# Patient Record
Sex: Male | Born: 1960 | Race: White | Hispanic: No | Marital: Married | Smoking: Never smoker
Health system: Southern US, Community
[De-identification: ages and names within clinical notes are randomized; demographics above are authoritative.]

## PROBLEM LIST (undated history)

## (undated) DIAGNOSIS — M1990 Unspecified osteoarthritis, unspecified site: Secondary | ICD-10-CM

## (undated) DIAGNOSIS — E890 Postprocedural hypothyroidism: Secondary | ICD-10-CM

## (undated) DIAGNOSIS — M545 Low back pain, unspecified: Secondary | ICD-10-CM

## (undated) DIAGNOSIS — I10 Essential (primary) hypertension: Secondary | ICD-10-CM

## (undated) DIAGNOSIS — E785 Hyperlipidemia, unspecified: Secondary | ICD-10-CM

## (undated) DIAGNOSIS — M259 Joint disorder, unspecified: Secondary | ICD-10-CM

## (undated) HISTORY — DX: Postprocedural hypothyroidism: E89.0

## (undated) HISTORY — DX: Unspecified osteoarthritis, unspecified site: M19.90

## (undated) HISTORY — DX: Low back pain, unspecified: M54.50

## (undated) HISTORY — DX: Joint disorder, unspecified: M25.9

## (undated) HISTORY — PX: OTHER SURGICAL HISTORY: SHX169

## (undated) HISTORY — PX: COLONOSCOPY: SHX174

## (undated) HISTORY — DX: Hyperlipidemia, unspecified: E78.5

## (undated) HISTORY — DX: Essential (primary) hypertension: I10

---

## 1997-09-27 HISTORY — PX: OTHER SURGICAL HISTORY: SHX169

## 1997-10-24 ENCOUNTER — Ambulatory Visit (HOSPITAL_COMMUNITY): Admission: RE | Admit: 1997-10-24 | Discharge: 1997-10-24 | Payer: Self-pay | Admitting: Internal Medicine

## 1997-10-27 ENCOUNTER — Ambulatory Visit (HOSPITAL_COMMUNITY): Admission: RE | Admit: 1997-10-27 | Discharge: 1997-10-27 | Payer: Self-pay | Admitting: Endocrinology

## 2001-07-23 ENCOUNTER — Encounter: Payer: Self-pay | Admitting: *Deleted

## 2001-07-23 ENCOUNTER — Ambulatory Visit (HOSPITAL_COMMUNITY): Admission: RE | Admit: 2001-07-23 | Discharge: 2001-07-23 | Payer: Self-pay | Admitting: *Deleted

## 2004-11-07 ENCOUNTER — Ambulatory Visit: Payer: Self-pay | Admitting: Endocrinology

## 2004-11-14 ENCOUNTER — Ambulatory Visit: Payer: Self-pay | Admitting: Endocrinology

## 2005-12-20 ENCOUNTER — Ambulatory Visit: Payer: Self-pay | Admitting: Endocrinology

## 2007-01-13 ENCOUNTER — Encounter: Payer: Self-pay | Admitting: Endocrinology

## 2007-01-13 DIAGNOSIS — E785 Hyperlipidemia, unspecified: Secondary | ICD-10-CM

## 2007-01-13 DIAGNOSIS — I10 Essential (primary) hypertension: Secondary | ICD-10-CM

## 2007-01-13 HISTORY — DX: Essential (primary) hypertension: I10

## 2007-01-13 HISTORY — DX: Hyperlipidemia, unspecified: E78.5

## 2007-02-25 ENCOUNTER — Ambulatory Visit: Payer: Self-pay | Admitting: Endocrinology

## 2007-02-25 LAB — CONVERTED CEMR LAB
ALT: 26 units/L (ref 0–53)
AST: 22 units/L (ref 0–37)
Albumin: 4.2 g/dL (ref 3.5–5.2)
Alkaline Phosphatase: 63 units/L (ref 39–117)
BUN: 12 mg/dL (ref 6–23)
Basophils Absolute: 0 10*3/uL (ref 0.0–0.1)
Basophils Relative: 0.6 % (ref 0.0–1.0)
Bilirubin Urine: NEGATIVE
Bilirubin, Direct: 0.2 mg/dL (ref 0.0–0.3)
CO2: 30 meq/L (ref 19–32)
Calcium: 9.4 mg/dL (ref 8.4–10.5)
Chloride: 102 meq/L (ref 96–112)
Cholesterol: 196 mg/dL (ref 0–200)
Creatinine, Ser: 1.2 mg/dL (ref 0.4–1.5)
Direct LDL: 135.5 mg/dL
Eosinophils Absolute: 0.1 10*3/uL (ref 0.0–0.6)
Eosinophils Relative: 2 % (ref 0.0–5.0)
GFR calc Af Amer: 85 mL/min
GFR calc non Af Amer: 70 mL/min
Glucose, Bld: 95 mg/dL (ref 70–99)
HCT: 43 % (ref 39.0–52.0)
HDL: 37.4 mg/dL — ABNORMAL LOW (ref >39.0)
Hemoglobin, Urine: NEGATIVE
Hemoglobin: 15.1 g/dL (ref 13.0–17.0)
Ketones, ur: NEGATIVE mg/dL
Leukocytes, UA: NEGATIVE
Lymphocytes Relative: 24.1 % (ref 12.0–46.0)
MCHC: 35.1 g/dL (ref 30.0–36.0)
MCV: 87 fL (ref 78.0–100.0)
Monocytes Absolute: 0.5 10*3/uL (ref 0.2–0.7)
Monocytes Relative: 7.4 % (ref 3.0–11.0)
Neutro Abs: 4.9 10*3/uL (ref 1.4–7.7)
Neutrophils Relative %: 65.9 % (ref 43.0–77.0)
Nitrite: NEGATIVE
PSA: 0.58 ng/mL (ref 0.10–4.00)
Platelets: 242 10*3/uL (ref 150–400)
Potassium: 4 meq/L (ref 3.5–5.1)
RBC: 4.94 M/uL (ref 4.22–5.81)
RDW: 12.8 % (ref 11.5–14.6)
Sodium: 139 meq/L (ref 135–145)
Specific Gravity, Urine: 1.025 (ref 1.000–1.03)
TSH: 2.23 microintl units/mL (ref 0.35–5.50)
Total Bilirubin: 0.8 mg/dL (ref 0.3–1.2)
Total CHOL/HDL Ratio: 5.2
Total Protein, Urine: NEGATIVE mg/dL
Total Protein: 7.2 g/dL (ref 6.0–8.3)
Triglycerides: 210 mg/dL (ref 0–149)
Urine Glucose: NEGATIVE mg/dL
Urobilinogen, UA: 0.2 (ref 0.0–1.0)
VLDL: 42 mg/dL — ABNORMAL HIGH (ref 0–40)
WBC: 7.2 10*3/uL (ref 4.5–10.5)
pH: 6 (ref 5.0–8.0)

## 2008-03-14 ENCOUNTER — Telehealth (INDEPENDENT_AMBULATORY_CARE_PROVIDER_SITE_OTHER): Payer: Self-pay | Admitting: *Deleted

## 2008-03-30 ENCOUNTER — Ambulatory Visit: Payer: Self-pay | Admitting: Endocrinology

## 2008-03-31 LAB — CONVERTED CEMR LAB
ALT: 23 units/L (ref 0–53)
AST: 20 units/L (ref 0–37)
Albumin: 4.2 g/dL (ref 3.5–5.2)
Alkaline Phosphatase: 46 units/L (ref 39–117)
BUN: 14 mg/dL (ref 6–23)
Basophils Absolute: 0 10*3/uL (ref 0.0–0.1)
Basophils Relative: 0.6 % (ref 0.0–3.0)
Bilirubin Urine: NEGATIVE
Bilirubin, Direct: 0.1 mg/dL (ref 0.0–0.3)
CO2: 30 meq/L (ref 19–32)
Calcium: 9.1 mg/dL (ref 8.4–10.5)
Chloride: 108 meq/L (ref 96–112)
Cholesterol: 184 mg/dL (ref 0–200)
Creatinine, Ser: 1.1 mg/dL (ref 0.4–1.5)
Eosinophils Absolute: 0.2 10*3/uL (ref 0.0–0.7)
Eosinophils Relative: 3.1 % (ref 0.0–5.0)
GFR calc Af Amer: 93 mL/min
GFR calc non Af Amer: 77 mL/min
Glucose, Bld: 92 mg/dL (ref 70–99)
HCT: 42.8 % (ref 39.0–52.0)
HDL: 39.7 mg/dL (ref >39.0)
Hemoglobin, Urine: NEGATIVE
Hemoglobin: 14.6 g/dL (ref 13.0–17.0)
Ketones, ur: NEGATIVE mg/dL
LDL Cholesterol: 130 mg/dL — ABNORMAL HIGH (ref 0–99)
Leukocytes, UA: NEGATIVE
Lymphocytes Relative: 35.2 % (ref 12.0–46.0)
MCHC: 34.2 g/dL (ref 30.0–36.0)
MCV: 89 fL (ref 78.0–100.0)
Monocytes Absolute: 0.4 10*3/uL (ref 0.1–1.0)
Monocytes Relative: 8.5 % (ref 3.0–12.0)
Neutro Abs: 2.8 10*3/uL (ref 1.4–7.7)
Neutrophils Relative %: 52.6 % (ref 43.0–77.0)
Nitrite: NEGATIVE
Platelets: 217 10*3/uL (ref 150–400)
Potassium: 3.6 meq/L (ref 3.5–5.1)
RBC: 4.81 M/uL (ref 4.22–5.81)
RDW: 12.9 % (ref 11.5–14.6)
Sodium: 143 meq/L (ref 135–145)
Specific Gravity, Urine: >=1.03 (ref 1.000–1.03)
TSH: 1.98 microintl units/mL (ref 0.35–5.50)
Total Bilirubin: 1.1 mg/dL (ref 0.3–1.2)
Total CHOL/HDL Ratio: 4.6
Total Protein, Urine: NEGATIVE mg/dL
Total Protein: 7 g/dL (ref 6.0–8.3)
Triglycerides: 73 mg/dL (ref 0–149)
Urine Glucose: NEGATIVE mg/dL
Urobilinogen, UA: 0.2 (ref 0.0–1.0)
VLDL: 15 mg/dL (ref 0–40)
WBC: 5.2 10*3/uL (ref 4.5–10.5)
pH: 6 (ref 5.0–8.0)

## 2008-04-05 ENCOUNTER — Ambulatory Visit: Payer: Self-pay | Admitting: Endocrinology

## 2008-04-05 DIAGNOSIS — E890 Postprocedural hypothyroidism: Secondary | ICD-10-CM

## 2008-04-05 HISTORY — DX: Postprocedural hypothyroidism: E89.0

## 2008-07-07 ENCOUNTER — Ambulatory Visit: Payer: Self-pay | Admitting: Endocrinology

## 2009-03-08 ENCOUNTER — Ambulatory Visit: Payer: Self-pay | Admitting: Internal Medicine

## 2009-04-17 ENCOUNTER — Telehealth (INDEPENDENT_AMBULATORY_CARE_PROVIDER_SITE_OTHER): Payer: Self-pay | Admitting: *Deleted

## 2009-07-26 ENCOUNTER — Ambulatory Visit: Payer: Self-pay | Admitting: Endocrinology

## 2009-07-26 LAB — CONVERTED CEMR LAB
ALT: 20 units/L (ref 0–53)
AST: 19 units/L (ref 0–37)
Albumin: 4.2 g/dL (ref 3.5–5.2)
Alkaline Phosphatase: 52 units/L (ref 39–117)
BUN: 12 mg/dL (ref 6–23)
Basophils Absolute: 0.1 10*3/uL (ref 0.0–0.1)
Basophils Relative: 0.9 % (ref 0.0–3.0)
Bilirubin Urine: NEGATIVE
Bilirubin, Direct: 0.1 mg/dL (ref 0.0–0.3)
CO2: 29 meq/L (ref 19–32)
Calcium: 9.3 mg/dL (ref 8.4–10.5)
Chloride: 106 meq/L (ref 96–112)
Cholesterol: 165 mg/dL (ref 0–200)
Creatinine, Ser: 1 mg/dL (ref 0.4–1.5)
Eosinophils Absolute: 0.2 10*3/uL (ref 0.0–0.7)
Eosinophils Relative: 2.5 % (ref 0.0–5.0)
GFR calc non Af Amer: 85.34 mL/min (ref >60)
Glucose, Bld: 91 mg/dL (ref 70–99)
HCT: 44.8 % (ref 39.0–52.0)
HDL: 42.4 mg/dL (ref >39.00)
Hemoglobin, Urine: NEGATIVE
Hemoglobin: 14.8 g/dL (ref 13.0–17.0)
Ketones, ur: NEGATIVE mg/dL
LDL Cholesterol: 105 mg/dL — ABNORMAL HIGH (ref 0–99)
Leukocytes, UA: NEGATIVE
Lymphocytes Relative: 27.1 % (ref 12.0–46.0)
Lymphs Abs: 1.8 10*3/uL (ref 0.7–4.0)
MCHC: 33 g/dL (ref 30.0–36.0)
MCV: 88.9 fL (ref 78.0–100.0)
Monocytes Absolute: 0.4 10*3/uL (ref 0.1–1.0)
Monocytes Relative: 6.4 % (ref 3.0–12.0)
Neutro Abs: 4.2 10*3/uL (ref 1.4–7.7)
Neutrophils Relative %: 63.1 % (ref 43.0–77.0)
Nitrite: NEGATIVE
PSA: 0.71 ng/mL (ref 0.10–4.00)
Platelets: 222 10*3/uL (ref 150.0–400.0)
Potassium: 3.5 meq/L (ref 3.5–5.1)
RBC: 5.04 M/uL (ref 4.22–5.81)
RDW: 12.6 % (ref 11.5–14.6)
Sodium: 143 meq/L (ref 135–145)
Specific Gravity, Urine: >=1.03 (ref 1.000–1.030)
TSH: 3.46 microintl units/mL (ref 0.35–5.50)
Total Bilirubin: 0.6 mg/dL (ref 0.3–1.2)
Total CHOL/HDL Ratio: 4
Total Protein, Urine: NEGATIVE mg/dL
Total Protein: 7.3 g/dL (ref 6.0–8.3)
Triglycerides: 89 mg/dL (ref 0.0–149.0)
Urine Glucose: NEGATIVE mg/dL
Urobilinogen, UA: 0.2 (ref 0.0–1.0)
VLDL: 17.8 mg/dL (ref 0.0–40.0)
WBC: 6.7 10*3/uL (ref 4.5–10.5)
pH: 5.5 (ref 5.0–8.0)
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
## 2010-08-27 ENCOUNTER — Other Ambulatory Visit (INDEPENDENT_AMBULATORY_CARE_PROVIDER_SITE_OTHER): Payer: BC Managed Care – PPO

## 2010-08-27 DIAGNOSIS — Z0389 Encounter for observation for other suspected diseases and conditions ruled out: Secondary | ICD-10-CM

## 2010-08-27 DIAGNOSIS — Z0000 Encounter for general adult medical examination without abnormal findings: Secondary | ICD-10-CM

## 2010-08-27 LAB — BASIC METABOLIC PANEL
Calcium: 8.6 mg/dL (ref 8.4–10.5)
Creatinine, Ser: 1.1 mg/dL (ref 0.4–1.5)
GFR: 77.72 mL/min (ref >60.00)
Sodium: 138 mEq/L (ref 135–145)

## 2010-08-27 LAB — LIPID PANEL
HDL: 42.6 mg/dL (ref >39.00)
LDL Cholesterol: 104 mg/dL — ABNORMAL HIGH (ref 0–99)
Total CHOL/HDL Ratio: 4
Triglycerides: 82 mg/dL (ref 0.0–149.0)

## 2010-08-27 LAB — CBC WITH DIFFERENTIAL/PLATELET
Basophils Relative: 0.5 % (ref 0.0–3.0)
Eosinophils Relative: 3.1 % (ref 0.0–5.0)
HCT: 42.6 % (ref 39.0–52.0)
Lymphs Abs: 1.9 10*3/uL (ref 0.7–4.0)
MCV: 87.5 fl (ref 78.0–100.0)
Monocytes Absolute: 0.4 10*3/uL (ref 0.1–1.0)
Monocytes Relative: 7.3 % (ref 3.0–12.0)
Platelets: 215 10*3/uL (ref 150.0–400.0)
RBC: 4.87 Mil/uL (ref 4.22–5.81)
WBC: 5.5 10*3/uL (ref 4.5–10.5)

## 2010-08-27 LAB — URINALYSIS
Leukocytes, UA: NEGATIVE
Specific Gravity, Urine: 1.025 (ref 1.000–1.030)
Urine Glucose: NEGATIVE
Urobilinogen, UA: 0.2 (ref 0.0–1.0)
pH: 5.5 (ref 5.0–8.0)

## 2010-08-27 LAB — HEPATIC FUNCTION PANEL
Alkaline Phosphatase: 53 U/L (ref 39–117)
Bilirubin, Direct: 0.1 mg/dL (ref 0.0–0.3)
Total Bilirubin: 0.8 mg/dL (ref 0.3–1.2)

## 2010-08-27 LAB — TSH: TSH: 5.97 u[IU]/mL — ABNORMAL HIGH (ref 0.35–5.50)

## 2010-08-27 LAB — PSA: PSA: 0.64 ng/mL (ref 0.10–4.00)

## 2010-09-03 ENCOUNTER — Ambulatory Visit: Payer: Self-pay | Admitting: Endocrinology

## 2010-09-11 ENCOUNTER — Encounter: Payer: Self-pay | Admitting: Endocrinology

## 2010-09-11 ENCOUNTER — Ambulatory Visit (INDEPENDENT_AMBULATORY_CARE_PROVIDER_SITE_OTHER): Payer: BC Managed Care – PPO | Admitting: Endocrinology

## 2010-09-11 VITALS — BP 128/86 | HR 79 | Temp 98.8°F | Ht 74.5 in | Wt 278.4 lb

## 2010-09-11 DIAGNOSIS — I10 Essential (primary) hypertension: Secondary | ICD-10-CM

## 2010-09-11 MED ORDER — LEVOTHYROXINE SODIUM 112 MCG PO CAPS: 2.0000 | ORAL_CAPSULE | Freq: Every day | ORAL | Status: DC

## 2010-09-11 MED ORDER — INDAPAMIDE 1.25 MG PO TABS: 1.2500 mg | ORAL_TABLET | Freq: Every day | ORAL | Status: DC

## 2010-09-11 NOTE — Progress Notes (Signed)
  Subjective:    Patient ID: Alexis Norris, male    DOB: 31-Aug-1960, 50 y.o.   MRN: [id number]  HPI here for regular wellness examination.  He's feeling pretty well in general, and says chronic med probs are stable, except as noted below Past Medical History  Diagnosis Date  . HYPERTENSION 01/13/2007  . HYPOTHYROIDISM, 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  . DYSLIPIDEMIA 01/13/2007    Past Surgical History  Procedure Date  . I-131 therapy 09/1997    History   Social History  . Marital Status: Married    Spouse Name: N/A    Number of Children: N/A  . Years of Education: N/A   Occupational History  . Paramedic    Social History Main Topics  . Smoking status: Never Smoker   . Smokeless tobacco: Not on file  . Alcohol Use: Not on file  . Drug Use: Not on file  . Sexually Active: Not on file   Other Topics Concern  . Not on file   Social History Narrative   Works Forensic scientist    No current outpatient prescriptions on file prior to visit.    Allergies  Allergen Reactions  . Simvastatin     REACTION: didn't feel well    Family History  Problem Relation Age of Onset  . Thyroid cancer Sister     BP 128/86  Pulse 79  Temp(Src) 98.8 F (37.1 C) (Oral)  Ht 6' 2.5" (1.892 m)  Wt 278 lb 6.4 oz (126.281 kg)  BMI 35.27 kg/m2  SpO2 98%    Review of Systems  Constitutional: Negative for fever.       Minimal weight gain  HENT: Negative for hearing loss.   Eyes: Negative for visual disturbance.  Respiratory: Negative for shortness of breath.   Cardiovascular: Negative for chest pain.  Gastrointestinal: Negative for blood in stool.  Genitourinary: Negative for hematuria.  Musculoskeletal: Negative for arthralgias.  Skin: Negative for rash.  Neurological: Negative for syncope.  Hematological: Does not bruise/bleed easily.  Psychiatric/Behavioral: Negative for dysphoric mood. The patient is not nervous/anxious.        Objective:   Physical Exam VS: see vs  page GEN: no distress HEAD: head: no deformity eyes: no periorbital swelling, no proptosis external nose and ears are normal mouth: no lesion seen. NECK: supple, thyroid is not enlarged. CHEST WALL: no deformity. BREASTS:  No gynecomastia. CV: reg rate and rhythm, no murmur. ABD: abdomen is soft, nontender.  no hepatosplenomegaly.  not distended.  no hernia MUSCULOSKELETAL: muscle bulk and strength are grossly normal.  no obvious joint swelling.  gait is normal and steady EXTEMITIES: no deformity.  no ulcer on the feet.  feet are of normal color and temp.  no edema PULSES: dorsalis pedis intact bilat.  no carotid bruit NEURO:  cn 2-12 grossly intact.   readily moves all 4's.  sensation is intact to touch on the feet SKIN:  Normal texture and temperature.  No rash or suspicious lesion is visible.  There are several sq nodules on the legs. NODES:  None palpable at the neck.  PSYCH: alert, oriented x3.  Does not appear anxious nor depressed.    Lab Results  Component Value Date   TSH 5.97* 08/27/2010     Assessment & Plan:  Wellness visit today, with problems stable, except as noted. Hypothyroidism, needs increased rx

## 2010-09-11 NOTE — Patient Instructions (Addendum)
Increase levothyroxine to 2x112 mcg/day.   please consider these measures for your health:  minimize alcohol.  do not use tobacco products.  have a colonoscopy at least every 10 years from age 48.  keep firearms safely stored.  always use seat belts.  have working smoke alarms in your home.  see an eye doctor and dentist regularly.  never drive under the influence of alcohol or drugs (including prescription drugs).  those with fair skin should take precautions against the sun. Please return in 1 year.

## 2010-09-14 NOTE — Cardiovascular Report (Signed)
[place]. [facility]  Patient:    Alexis Norris, Alexis Norris Visit Number: [id number] MRN: [id number]          Service Type: CAT Location: [facility] 2857 [id number] Attending Physician:  Daisey Must Dictated by:   Daisey Must, M.D. [facility] Proc. Date: 07/23/01 Admit Date:  07/23/2001 Discharge Date: 07/23/2001   CC:         Justine Null, M.D. [facility]  Nathen May, M.D. [facility]  Cardiac Catheterization Laboratory   Cardiac Catheterization  PROCEDURES PERFORMED: Left heart catheterization with coronary angiography, left ventriculography, and aortic root angiography.  INDICATIONS: The patient is a 50 year old male with significant cardiac risk factors. He has been having chest pain. A recent stress Cardiolite showed possible ischemia in the anterior wall. He was then referred for cardiac catheterization.  DESCRIPTION OF PROCEDURE: A 6 French sheath was placed in the right femoral artery.  Standard Judkins 6 French catheters were utilized.  Contrast was Omnipaque.  There were no complications.  RESULTS:  HEMODYNAMICS: Left ventricular pressure 132/17, aortic pressure 132/86.  There was no aortic valve gradient.  LEFT VENTRICULOGRAM: Wall motion is normal. Ejection fraction calculated at 66%. There is no mitral regurgitation.  Aortic root angiography reveals a normal tricuspid aortic valve with no aortic insufficiency. Aortic root itself is also normal.  CORONARY ARTERIOGRAPHY: (Codominant).  Left main is short but normal.  Left anterior descending artery gives rise to a small diagonal branch. The LAD is normal.  Left circumflex is a large codominant vessel. It gives rise to a small ramus intermediate, large OM-1, small OM-2 and a normal sized posterolateral branch. The left circumflex is normal by angiography.  The right coronary artery is a relatively small codominant vessel. It gives rise to a small to normal sized posterior descending  artery and a small posterolateral branch. The right coronary artery is normal.  IMPRESSIONS: 1. Normal left ventricular systolic function. 2. Angiographically normal coronary arteries. Dictated by:   Daisey Must, M.D. [facility] Attending Physician:  Daisey Must DD:  07/23/01 TD:  07/24/01 Job: [id number]

## 2011-08-22 ENCOUNTER — Emergency Department (HOSPITAL_COMMUNITY): Payer: No Typology Code available for payment source

## 2011-08-22 ENCOUNTER — Encounter (HOSPITAL_COMMUNITY): Payer: Self-pay | Admitting: Emergency Medicine

## 2011-08-22 ENCOUNTER — Emergency Department (HOSPITAL_COMMUNITY)
Admission: EM | Admit: 2011-08-22 | Discharge: 2011-08-23 | Disposition: A | Discharge status: Home / Self Care | Payer: No Typology Code available for payment source | Attending: Emergency Medicine | Admitting: Emergency Medicine

## 2011-08-22 DIAGNOSIS — S5010XA Contusion of unspecified forearm, initial encounter: Secondary | ICD-10-CM | POA: Insufficient documentation

## 2011-08-22 DIAGNOSIS — Z79899 Other long term (current) drug therapy: Secondary | ICD-10-CM | POA: Insufficient documentation

## 2011-08-22 DIAGNOSIS — S6390XA Sprain of unspecified part of unspecified wrist and hand, initial encounter: Secondary | ICD-10-CM | POA: Insufficient documentation

## 2011-08-22 DIAGNOSIS — M7989 Other specified soft tissue disorders: Secondary | ICD-10-CM | POA: Insufficient documentation

## 2011-08-22 DIAGNOSIS — S20219A Contusion of unspecified front wall of thorax, initial encounter: Secondary | ICD-10-CM | POA: Insufficient documentation

## 2011-08-22 DIAGNOSIS — E876 Hypokalemia: Secondary | ICD-10-CM

## 2011-08-22 DIAGNOSIS — R079 Chest pain, unspecified: Secondary | ICD-10-CM | POA: Insufficient documentation

## 2011-08-22 DIAGNOSIS — I10 Essential (primary) hypertension: Secondary | ICD-10-CM | POA: Insufficient documentation

## 2011-08-22 DIAGNOSIS — E039 Hypothyroidism, unspecified: Secondary | ICD-10-CM | POA: Insufficient documentation

## 2011-08-22 DIAGNOSIS — S63609A Unspecified sprain of unspecified thumb, initial encounter: Secondary | ICD-10-CM

## 2011-08-22 DIAGNOSIS — S80219A Abrasion, unspecified knee, initial encounter: Secondary | ICD-10-CM

## 2011-08-22 DIAGNOSIS — S6000XA Contusion of unspecified finger without damage to nail, initial encounter: Secondary | ICD-10-CM | POA: Insufficient documentation

## 2011-08-22 DIAGNOSIS — E785 Hyperlipidemia, unspecified: Secondary | ICD-10-CM | POA: Insufficient documentation

## 2011-08-22 DIAGNOSIS — IMO0002 Reserved for concepts with insufficient information to code with codable children: Secondary | ICD-10-CM | POA: Insufficient documentation

## 2011-08-22 DIAGNOSIS — IMO0001 Reserved for inherently not codable concepts without codable children: Secondary | ICD-10-CM | POA: Insufficient documentation

## 2011-08-22 LAB — COMPREHENSIVE METABOLIC PANEL
Albumin: 4.2 g/dL (ref 3.5–5.2)
BUN: 13 mg/dL (ref 6–23)
Chloride: 103 mEq/L (ref 96–112)
Creatinine, Ser: 1.24 mg/dL (ref 0.50–1.35)
GFR calc Af Amer: 78 mL/min — ABNORMAL LOW (ref >90)
Total Bilirubin: 0.4 mg/dL (ref 0.3–1.2)

## 2011-08-22 LAB — POCT I-STAT, CHEM 8
Creatinine, Ser: 1.2 mg/dL (ref 0.50–1.35)
HCT: 44 % (ref 39.0–52.0)
Hemoglobin: 15 g/dL (ref 13.0–17.0)
Potassium: 2.8 mEq/L — ABNORMAL LOW (ref 3.5–5.1)
Sodium: 144 mEq/L (ref 135–145)

## 2011-08-22 LAB — CBC
HCT: 44.4 % (ref 39.0–52.0)
Hemoglobin: 15.4 g/dL (ref 13.0–17.0)
MCH: 29.2 pg (ref 26.0–34.0)
MCHC: 34.7 g/dL (ref 30.0–36.0)

## 2011-08-22 LAB — DIFFERENTIAL
Basophils Relative: 0 % (ref 0–1)
Eosinophils Absolute: 0.2 10*3/uL (ref 0.0–0.7)
Monocytes Absolute: 0.5 10*3/uL (ref 0.1–1.0)
Monocytes Relative: 7 % (ref 3–12)
Neutro Abs: 4 10*3/uL (ref 1.7–7.7)

## 2011-08-22 MED ORDER — IOHEXOL 300 MG/ML  SOLN: 100.0000 mL | Freq: Once | INTRAMUSCULAR | Status: AC | PRN

## 2011-08-22 MED ORDER — POTASSIUM CHLORIDE CRYS ER 20 MEQ PO TBCR
40.0000 meq | EXTENDED_RELEASE_TABLET | Freq: Once | ORAL | Status: AC
  Administered 2011-08-22: 40 meq via ORAL
  Filled 2011-08-22: qty 2

## 2011-08-22 MED ORDER — HYDROMORPHONE HCL PF 1 MG/ML IJ SOLN
1.0000 mg | Freq: Once | INTRAMUSCULAR | Status: AC
  Administered 2011-08-22: 1 mg via INTRAVENOUS
  Filled 2011-08-22: qty 1

## 2011-08-22 NOTE — ED Notes (Signed)
The pt is alert and oriented skin warm and dry.  C/o being hot
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
## 2011-08-22 NOTE — ED Notes (Signed)
The pt says he is a little more comfortable.  He still has pressure on his chest
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
## 2011-08-23 MED ORDER — IOHEXOL 300 MG/ML  SOLN
100.0000 mL | Freq: Once | INTRAMUSCULAR | Status: AC | PRN
  Administered 2011-08-23: 100 mL via INTRAVENOUS

## 2011-08-23 MED ORDER — POTASSIUM CHLORIDE ER 10 MEQ PO TBCR: 20.0000 meq | EXTENDED_RELEASE_TABLET | Freq: Two times a day (BID) | ORAL | Status: DC

## 2011-08-23 MED ORDER — IOHEXOL 300 MG/ML  SOLN: 100.0000 mL | Freq: Once | INTRAMUSCULAR | Status: AC | PRN

## 2011-08-23 MED ORDER — HYDROCODONE-ACETAMINOPHEN 5-500 MG PO TABS: 1.0000 | ORAL_TABLET | Freq: Four times a day (QID) | ORAL | Status: DC | PRN

## 2011-08-23 NOTE — ED Notes (Signed)
The pt returned from xray his wife is at the bedside

## 2011-08-24 NOTE — ED Provider Notes (Signed)
I saw and evaluated the patient, reviewed the resident's note and I agree with the findings and plan.   .Face to face Exam:  General:  Awake HEENT:  Atraumatic Resp:  Normal effort Abd:  Nondistended Neuro:No focal weakness Lymph: No adenopathy   Nelia Shi, MD 08/24/11 [phone]

## 2011-08-26 ENCOUNTER — Ambulatory Visit (INDEPENDENT_AMBULATORY_CARE_PROVIDER_SITE_OTHER): Payer: No Typology Code available for payment source | Admitting: Endocrinology

## 2011-08-26 ENCOUNTER — Encounter: Payer: Self-pay | Admitting: Endocrinology

## 2011-08-26 VITALS — BP 146/84 | HR 103 | Temp 98.7°F | Ht 74.5 in | Wt 214.0 lb

## 2011-08-26 DIAGNOSIS — E785 Hyperlipidemia, unspecified: Secondary | ICD-10-CM

## 2011-08-26 DIAGNOSIS — J309 Allergic rhinitis, unspecified: Secondary | ICD-10-CM | POA: Insufficient documentation

## 2011-08-26 DIAGNOSIS — E890 Postprocedural hypothyroidism: Secondary | ICD-10-CM

## 2011-08-26 DIAGNOSIS — I10 Essential (primary) hypertension: Secondary | ICD-10-CM

## 2011-08-26 DIAGNOSIS — Z79899 Other long term (current) drug therapy: Secondary | ICD-10-CM | POA: Insufficient documentation

## 2011-08-26 MED ORDER — HYDROCODONE-ACETAMINOPHEN 5-500 MG PO TABS: 1.0000 | ORAL_TABLET | Freq: Four times a day (QID) | ORAL | Status: AC | PRN

## 2011-08-26 NOTE — Patient Instructions (Addendum)
Here is a refill of the pain medication. blood tests are being requested for you today.  You will receive a letter with results. Please come in for your regular physical after 09/11/11.   Here is a sample of "nasonex," to take 1 spray each nostril daily.
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
## 2011-08-27 ENCOUNTER — Other Ambulatory Visit (INDEPENDENT_AMBULATORY_CARE_PROVIDER_SITE_OTHER): Payer: No Typology Code available for payment source

## 2011-08-27 ENCOUNTER — Encounter: Payer: Self-pay | Admitting: Endocrinology

## 2011-08-27 DIAGNOSIS — Z79899 Other long term (current) drug therapy: Secondary | ICD-10-CM

## 2011-08-27 DIAGNOSIS — E890 Postprocedural hypothyroidism: Secondary | ICD-10-CM

## 2011-08-27 DIAGNOSIS — I10 Essential (primary) hypertension: Secondary | ICD-10-CM

## 2011-08-27 DIAGNOSIS — E785 Hyperlipidemia, unspecified: Secondary | ICD-10-CM

## 2011-08-27 LAB — BASIC METABOLIC PANEL
GFR: 88.66 mL/min (ref >60.00)
Glucose, Bld: 90 mg/dL (ref 70–99)
Potassium: 3.8 mEq/L (ref 3.5–5.1)
Sodium: 141 mEq/L (ref 135–145)

## 2011-08-27 LAB — URINALYSIS, ROUTINE W REFLEX MICROSCOPIC
Bilirubin Urine: NEGATIVE
Hgb urine dipstick: NEGATIVE
Ketones, ur: NEGATIVE
Total Protein, Urine: NEGATIVE
Urine Glucose: NEGATIVE
pH: 6 (ref 5.0–8.0)

## 2011-08-27 LAB — CBC WITH DIFFERENTIAL/PLATELET
Basophils Absolute: 0 10*3/uL (ref 0.0–0.1)
Basophils Relative: 0.6 % (ref 0.0–3.0)
Eosinophils Absolute: 0.2 10*3/uL (ref 0.0–0.7)
HCT: 44.5 % (ref 39.0–52.0)
Hemoglobin: 15.2 g/dL (ref 13.0–17.0)
Lymphs Abs: 1.8 10*3/uL (ref 0.7–4.0)
MCHC: 34.1 g/dL (ref 30.0–36.0)
Monocytes Relative: 5.8 % (ref 3.0–12.0)
Neutro Abs: 4 10*3/uL (ref 1.4–7.7)
RBC: 5.23 Mil/uL (ref 4.22–5.81)
RDW: 14.3 % (ref 11.5–14.6)

## 2011-08-27 LAB — LIPID PANEL
Cholesterol: 169 mg/dL (ref 0–200)
HDL: 45.4 mg/dL (ref >39.00)
Triglycerides: 113 mg/dL (ref 0.0–149.0)
VLDL: 22.6 mg/dL (ref 0.0–40.0)

## 2011-08-27 LAB — HEPATIC FUNCTION PANEL
AST: 20 U/L (ref 0–37)
Albumin: 4 g/dL (ref 3.5–5.2)
Alkaline Phosphatase: 64 U/L (ref 39–117)
Total Protein: 7.5 g/dL (ref 6.0–8.3)

## 2011-08-27 LAB — TSH: TSH: 5.85 u[IU]/mL — ABNORMAL HIGH (ref 0.35–5.50)

## 2011-08-27 MED ORDER — LEVOTHYROXINE SODIUM 125 MCG PO TABS: ORAL_TABLET | ORAL | Status: DC

## 2011-08-28 ENCOUNTER — Telehealth: Payer: Self-pay | Admitting: *Deleted

## 2011-08-28 MED ORDER — INDAPAMIDE 1.25 MG PO TABS: 1.2500 mg | ORAL_TABLET | Freq: Every day | ORAL | Status: DC

## 2011-08-28 NOTE — Telephone Encounter (Signed)
Called pt to inform of lab results, pt informed of results (letter also mailed to pt). 

## 2011-09-17 ENCOUNTER — Ambulatory Visit (INDEPENDENT_AMBULATORY_CARE_PROVIDER_SITE_OTHER): Payer: No Typology Code available for payment source | Admitting: Endocrinology

## 2011-09-17 DIAGNOSIS — Z0000 Encounter for general adult medical examination without abnormal findings: Secondary | ICD-10-CM

## 2011-09-17 NOTE — Patient Instructions (Signed)
please consider these measures for your health:  minimize alcohol.  do not use tobacco products.  have a colonoscopy at least every 10 years from age 52.  keep firearms safely stored.  always use seat belts.  have working smoke alarms in your home.  see an eye doctor and dentist regularly.  never drive under the influence of alcohol or drugs (including prescription drugs).  those with fair skin should take precautions against the sun. Please return in 1 year.   
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
## 2011-09-21 DIAGNOSIS — Z0000 Encounter for general adult medical examination without abnormal findings: Secondary | ICD-10-CM | POA: Insufficient documentation
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
## 2012-09-16 ENCOUNTER — Other Ambulatory Visit: Payer: Self-pay | Admitting: Endocrinology

## 2012-09-22 ENCOUNTER — Other Ambulatory Visit: Payer: Self-pay | Admitting: Endocrinology

## 2012-10-16 ENCOUNTER — Telehealth: Payer: Self-pay

## 2012-10-16 ENCOUNTER — Other Ambulatory Visit: Payer: Self-pay | Admitting: *Deleted

## 2012-10-16 MED ORDER — LEVOTHYROXINE SODIUM 125 MCG PO TABS: ORAL_TABLET | ORAL | Status: DC

## 2012-10-16 NOTE — Telephone Encounter (Signed)
Pt called [facility] requesting a call back from Dr Everardo All office. Thanks

## 2012-10-16 NOTE — Telephone Encounter (Signed)
Pt was just being sure his rx was sent in and wanted to know if he needed to schedule an appt with Dr Everardo All. I advised him rx was sent in and he does need to schedule an appt.

## 2012-12-02 ENCOUNTER — Other Ambulatory Visit: Payer: Self-pay

## 2012-12-02 DIAGNOSIS — Z125 Encounter for screening for malignant neoplasm of prostate: Secondary | ICD-10-CM

## 2012-12-02 DIAGNOSIS — Z0000 Encounter for general adult medical examination without abnormal findings: Secondary | ICD-10-CM

## 2012-12-08 ENCOUNTER — Other Ambulatory Visit (INDEPENDENT_AMBULATORY_CARE_PROVIDER_SITE_OTHER): Payer: BC Managed Care – PPO

## 2012-12-08 DIAGNOSIS — Z125 Encounter for screening for malignant neoplasm of prostate: Secondary | ICD-10-CM

## 2012-12-08 DIAGNOSIS — Z0000 Encounter for general adult medical examination without abnormal findings: Secondary | ICD-10-CM

## 2012-12-08 DIAGNOSIS — E785 Hyperlipidemia, unspecified: Secondary | ICD-10-CM

## 2012-12-08 LAB — URINALYSIS, ROUTINE W REFLEX MICROSCOPIC
Leukocytes, UA: NEGATIVE
Nitrite: NEGATIVE
Specific Gravity, Urine: 1.025 (ref 1.000–1.030)
Total Protein, Urine: NEGATIVE
pH: 6 (ref 5.0–8.0)

## 2012-12-08 LAB — BASIC METABOLIC PANEL
BUN: 18 mg/dL (ref 6–23)
CO2: 26 mEq/L (ref 19–32)
Calcium: 8.8 mg/dL (ref 8.4–10.5)
Creatinine, Ser: 0.9 mg/dL (ref 0.4–1.5)
GFR: 92.63 mL/min (ref >60.00)
Glucose, Bld: 86 mg/dL (ref 70–99)

## 2012-12-08 LAB — CBC WITH DIFFERENTIAL/PLATELET
Basophils Absolute: 0.1 10*3/uL (ref 0.0–0.1)
Eosinophils Relative: 1.6 % (ref 0.0–5.0)
HCT: 43.3 % (ref 39.0–52.0)
Lymphocytes Relative: 14.4 % (ref 12.0–46.0)
Lymphs Abs: 1.9 10*3/uL (ref 0.7–4.0)
Monocytes Relative: 5.2 % (ref 3.0–12.0)
Platelets: 261 10*3/uL (ref 150.0–400.0)
RDW: 13.9 % (ref 11.5–14.6)
WBC: 13.5 10*3/uL — ABNORMAL HIGH (ref 4.5–10.5)

## 2012-12-08 LAB — PSA: PSA: 0.74 ng/mL (ref 0.10–4.00)

## 2012-12-08 LAB — HEPATIC FUNCTION PANEL
ALT: 16 U/L (ref 0–53)
AST: 16 U/L (ref 0–37)
Alkaline Phosphatase: 58 U/L (ref 39–117)
Bilirubin, Direct: 0 mg/dL (ref 0.0–0.3)
Total Bilirubin: 0.5 mg/dL (ref 0.3–1.2)

## 2012-12-08 LAB — LIPID PANEL
Cholesterol: 176 mg/dL (ref 0–200)
Total CHOL/HDL Ratio: 6
Triglycerides: 477 mg/dL — ABNORMAL HIGH (ref 0.0–149.0)

## 2012-12-11 ENCOUNTER — Ambulatory Visit: Payer: BC Managed Care – PPO | Admitting: Endocrinology

## 2012-12-15 ENCOUNTER — Ambulatory Visit (INDEPENDENT_AMBULATORY_CARE_PROVIDER_SITE_OTHER): Payer: BC Managed Care – PPO | Admitting: Endocrinology

## 2012-12-15 ENCOUNTER — Encounter: Payer: Self-pay | Admitting: Endocrinology

## 2012-12-15 VITALS — BP 134/80 | Ht 74.0 in | Wt 267.0 lb

## 2012-12-15 DIAGNOSIS — M25562 Pain in left knee: Secondary | ICD-10-CM

## 2012-12-15 DIAGNOSIS — Z0000 Encounter for general adult medical examination without abnormal findings: Secondary | ICD-10-CM

## 2012-12-15 DIAGNOSIS — M25569 Pain in unspecified knee: Secondary | ICD-10-CM | POA: Insufficient documentation

## 2012-12-15 MED ORDER — LEVOTHYROXINE SODIUM 112 MCG PO TABS: ORAL_TABLET | ORAL | Status: DC

## 2012-12-15 MED ORDER — POTASSIUM CHLORIDE ER 8 MEQ PO TBCR: 8.0000 meq | EXTENDED_RELEASE_TABLET | Freq: Every day | ORAL | Status: DC

## 2012-12-15 MED ORDER — DOXYCYCLINE HYCLATE 100 MG PO TABS: 100.0000 mg | ORAL_TABLET | Freq: Two times a day (BID) | ORAL | Status: DC

## 2012-12-15 MED ORDER — INDAPAMIDE 1.25 MG PO TABS: 1.2500 mg | ORAL_TABLET | Freq: Every day | ORAL | Status: DC

## 2012-12-15 NOTE — Patient Instructions (Addendum)
i have sent a prescription to your pharmacy, for the potassium Weight-loss helps lower the triglycerides.   Please reduce the thyroid pill to 2x112 mcg/day.  Refer to an orthopedic specialist.  you will receive a phone call, about a day and time for an appointment. i have sent a prescription to your pharmacy, for the skin bumps.  please consider these measures for your health:  minimize alcohol.  do not use tobacco products.   keep firearms safely stored.  always use seat belts.  have working smoke alarms in your home.  see an eye doctor and dentist regularly.  never drive under the influence of alcohol or drugs (including prescription drugs).  those with fair skin should take precautions against the sun.   Refer for a colonoscopy.  you will receive a phone call, about a day and time for an appointment in feb, 2015.

## 2012-12-15 NOTE — Progress Notes (Signed)
Subjective:    Patient ID: Alexis Norris, male    DOB: [birth year], 52 y.o.   MRN: [id number]  HPI Pt is here for regular wellness examination, and is feeling pretty well in general, and says chronic med probs are stable, except as noted below Past Medical History  Diagnosis Date  . HYPERTENSION 01/13/2007  . HYPOTHYROIDISM, 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  . DYSLIPIDEMIA 01/13/2007    Past Surgical History  Procedure Laterality Date  . I-131 therapy  09/1997    History   Social History  . Marital Status: Married    Spouse Name: N/A    Number of Children: N/A  . Years of Education: N/A   Occupational History  . Paramedic    Social History Main Topics  . Smoking status: Never Smoker   . Smokeless tobacco: Not on file  . Alcohol Use: Not on file  . Drug Use: Not on file  . Sexual Activity: Not on file   Other Topics Concern  . Not on file   Social History Narrative   Works Forensic scientist    No current outpatient prescriptions on file prior to visit.   No current facility-administered medications on file prior to visit.    Allergies  Allergen Reactions  . Simvastatin     REACTION: didn't feel well    Family History  Problem Relation Age of Onset  . Thyroid cancer Sister     BP 134/80  Ht 6\' 2"  (1.88 m)  Wt 267 lb (121.11 kg)  BMI 34.27 kg/m2  SpO2 97%   Review of Systems  Constitutional: Negative for unexpected weight change.  HENT: Negative for hearing loss.   Eyes: Negative for visual disturbance.  Respiratory: Negative for shortness of breath.   Cardiovascular: Negative for chest pain.  Gastrointestinal: Negative for anal bleeding.  Endocrine: Negative for cold intolerance.  Genitourinary: Negative for hematuria.  Musculoskeletal: Negative for myalgias.  Skin: Negative for color change.  Allergic/Immunologic: Positive for environmental allergies.  Neurological: Negative for syncope.  Hematological: Does not bruise/bleed easily.   Psychiatric/Behavioral: Negative for dysphoric mood.       Objective:   Physical Exam VS: see vs page GEN: no distress HEAD: head: no deformity eyes: no periorbital swelling, no proptosis external nose and ears are normal mouth: no lesion seen NECK: supple, thyroid is not enlarged CHEST WALL: no deformity LUNGS: clear to auscultation BREASTS:  No gynecomastia CV: reg rate and rhythm, no murmur ABD: abdomen is soft, nontender.  no hepatosplenomegaly.  not distended.  no hernia. MUSCULOSKELETAL: muscle bulk and strength are grossly normal.  no obvious joint swelling.  gait is normal and steady EXTEMITIES: no deformity.  no ulcer on the feet.  feet are of normal color and temp.  no edema PULSES: dorsalis pedis intact bilat.  no carotid bruit NEURO:  cn 2-12 grossly intact.   readily moves all 4's.  sensation is intact to touch on the feet SKIN:  Normal texture and temperature.    NODES:  None palpable at the neck PSYCH: alert, oriented x3.  Does not appear anxious nor depressed.      Assessment & Plan:  Wellness visit today, with problems stable, except as noted.   SEPARATE EVALUATION FOLLOWS--EACH PROBLEM HERE IS NEW, NOT RESPONDING TO TREATMENT, OR POSES SIGNIFICANT RISK TO THE PATIENT'S HEALTH: HISTORY OF THE PRESENT ILLNESS: He has a few mos of slight "bumps" at the axillae.  No assoc fever He says the labs were not fasting. PAST MEDICAL HISTORY  reviewed and up to date today. REVIEW OF SYSTEMS: He has left knee swelling (Ortho eval was neg 1 year ago).  He denies numbness PHYSICAL EXAMINATION: VITAL SIGNS:  See vs page GENERAL: no distress Left knee is swollen Axillae: folliculitis LABS:  Lab Results  Component Value Date   TSH 0.23* 12/08/2012   IMPRESSION: Folliculitis, new Knee pain, recurrent Hypothyroidism, overreplaced PLAN: See instruction page

## 2012-12-24 ENCOUNTER — Other Ambulatory Visit: Payer: Self-pay | Admitting: Orthopaedic Surgery

## 2012-12-24 DIAGNOSIS — M25562 Pain in left knee: Secondary | ICD-10-CM

## 2013-01-01 ENCOUNTER — Other Ambulatory Visit: Payer: BC Managed Care – PPO

## 2013-01-20 ENCOUNTER — Ambulatory Visit (INDEPENDENT_AMBULATORY_CARE_PROVIDER_SITE_OTHER): Payer: BC Managed Care – PPO | Admitting: Endocrinology

## 2013-01-20 ENCOUNTER — Encounter: Payer: Self-pay | Admitting: Endocrinology

## 2013-01-20 VITALS — BP 134/80 | HR 82 | Wt 269.0 lb

## 2013-01-20 DIAGNOSIS — R1031 Right lower quadrant pain: Secondary | ICD-10-CM | POA: Insufficient documentation

## 2013-01-20 MED ORDER — TRIAMTERENE-HCTZ 37.5-25 MG PO TABS: 1.0000 | ORAL_TABLET | ORAL | Status: DC

## 2013-01-20 NOTE — Progress Notes (Signed)
  Subjective:    Patient ID: Alexis Norris, male    DOB: 01-14-1961, 52 y.o.   MRN: [id number]  HPI Pt states a month of slight fullness at the right inguinal area, and intermittently assoc numbness.  He has slight assoc pain there.  sxs are slightly worse with lifting.   Pt says the K+ was very expensive Past Medical History  Diagnosis Date  . HYPERTENSION 01/13/2007  . HYPOTHYROIDISM, 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  . DYSLIPIDEMIA 01/13/2007    Past Surgical History  Procedure Laterality Date  . I-131 therapy  09/1997    History   Social History  . Marital Status: Married    Spouse Name: N/A    Number of Children: N/A  . Years of Education: N/A   Occupational History  . Paramedic    Social History Main Topics  . Smoking status: Never Smoker   . Smokeless tobacco: Not on file  . Alcohol Use: Not on file  . Drug Use: Not on file  . Sexual Activity: Not on file   Other Topics Concern  . Not on file   Social History Narrative   Works Forensic scientist    Current Outpatient Prescriptions on File Prior to Visit  Medication Sig Dispense Refill  . levothyroxine (SYNTHROID, LEVOTHROID) 112 MCG tablet 2 tabs daily  180 tablet  3   No current facility-administered medications on file prior to visit.    Allergies  Allergen Reactions  . Simvastatin     REACTION: didn't feel well    Family History  Problem Relation Age of Onset  . Thyroid cancer Sister     BP 134/80  Pulse 82  Wt 269 lb (122.018 kg)  BMI 34.52 kg/m2  SpO2 97%  Review of Systems Denies hematuria and dysuria.      Objective:   Physical Exam VITAL SIGNS:  See vs page GENERAL: no distress Right inguinal area: no palpable hernia. Neuro: sensation is intact to touch at the right inguinal area and both LE's.       Assessment & Plan:  Inguinal sxs, new, uncertain etiology HTN: well-controlled Hypokalemia: he requests cheaper rx

## 2013-01-20 NOTE — Patient Instructions (Addendum)
Refer to a specialist.  you will receive a phone call, about a day and time for an appointment.   Change the indapamide and potassium to a single pill.  i have sent a prescription to your pharmacy.

## 2013-01-28 ENCOUNTER — Encounter (INDEPENDENT_AMBULATORY_CARE_PROVIDER_SITE_OTHER): Payer: Self-pay | Admitting: General Surgery

## 2013-01-28 ENCOUNTER — Ambulatory Visit (INDEPENDENT_AMBULATORY_CARE_PROVIDER_SITE_OTHER): Payer: BC Managed Care – PPO | Admitting: General Surgery

## 2013-01-28 VITALS — BP 130/88 | HR 80 | Resp 14 | Ht 74.5 in | Wt 265.0 lb

## 2013-01-28 DIAGNOSIS — K4090 Unilateral inguinal hernia, without obstruction or gangrene, not specified as recurrent: Secondary | ICD-10-CM

## 2013-01-28 NOTE — Progress Notes (Signed)
Patient ID: Ivis Henneman, male   DOB: 18-May-1960, 52 y.o.   MRN: [id number]  No chief complaint on file.   HPI Harkirat Orozco is a 52 y.o. male.  The patient is a 52 year old male who is referred by Dr. Romero Belling for evaluation of right inguinal hernia. The patient she stems and discomfort to his right inguinal area for past 2 to 3 months. The patient states that he's had some numbness in the area however is not otherwise had pain. He works at the [employer] and does a lot of heavy lifting. HPI  Past Medical History  Diagnosis Date  . HYPERTENSION 01/13/2007  . HYPOTHYROIDISM, 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  . DYSLIPIDEMIA 01/13/2007    Past Surgical History  Procedure Laterality Date  . I-131 therapy  09/1997    Family History  Problem Relation Age of Onset  . Thyroid cancer Sister   . Heart attack Father   . Cancer Mother     Social History History  Substance Use Topics  . Smoking status: Never Smoker   . Smokeless tobacco: Not on file  . Alcohol Use: Not on file    Allergies  Allergen Reactions  . Simvastatin     REACTION: didn't feel well    Current Outpatient Prescriptions  Medication Sig Dispense Refill  . levothyroxine (SYNTHROID, LEVOTHROID) 112 MCG tablet 2 tabs daily  180 tablet  3  . triamterene-hydrochlorothiazide (MAXZIDE-25) 37.5-25 MG per tablet Take 1 tablet by mouth every other day.  30 tablet  5   No current facility-administered medications for this visit.    Review of Systems Review of Systems  Constitutional: Negative.   HENT: Negative.   Respiratory: Negative.   Cardiovascular: Negative.   Gastrointestinal: Negative.   Neurological: Negative.   All other systems reviewed and are negative.    Blood pressure 130/88, pulse 80, resp. rate 14, height 6' 2.5" (1.892 m), weight 265 lb (120.203 kg).  Physical Exam Physical Exam  Constitutional: He is oriented to person, place, and time. He appears well-developed and well-nourished.    HENT:  Head: Normocephalic and atraumatic.  Eyes: Conjunctivae and EOM are normal. Pupils are equal, round, and reactive to light.  Neck: Normal range of motion. Neck supple.  Cardiovascular: Normal rate, regular rhythm and normal heart sounds.   Pulmonary/Chest: Effort normal and breath sounds normal.  Abdominal: Soft. Bowel sounds are normal. There is no tenderness. There is no rebound and no guarding. A hernia is present. Hernia confirmed positive in the right inguinal area. Hernia confirmed negative in the ventral area and confirmed negative in the left inguinal area.    Musculoskeletal: Normal range of motion.  Neurological: He is alert and oriented to person, place, and time.    Data Reviewed none  Assessment    52 year old male with a reducible right inguinal hernia     Plan    1.  I discussed with the patient the details and the anatomy physiology of his right inguinal hernia. At this point he would like to consider his options to wait on scheduling surgery. He is currently having evaluation of this left knee meniscus and may need an MRI.  2. Patient will call back when he feels he is ready to have surgical repair. 3. I discussed with him the signs and symptoms of incarcerated and strangulated hernia and the need to proceed to the ER should these occur.       Marigene Ehlers., Elina Streng 01/28/2013, 10:03 AM

## 2013-09-10 ENCOUNTER — Encounter (INDEPENDENT_AMBULATORY_CARE_PROVIDER_SITE_OTHER): Payer: Self-pay | Admitting: General Surgery

## 2013-09-10 ENCOUNTER — Ambulatory Visit (INDEPENDENT_AMBULATORY_CARE_PROVIDER_SITE_OTHER): Payer: 59 | Admitting: General Surgery

## 2013-09-10 VITALS — Wt 261.0 lb

## 2013-09-10 DIAGNOSIS — K4090 Unilateral inguinal hernia, without obstruction or gangrene, not specified as recurrent: Secondary | ICD-10-CM

## 2013-09-10 NOTE — Progress Notes (Signed)
Patient ID: Alexis Norris, male   DOB: [birth year], 53 y.o.   MRN: [id number]  Chief Complaint  Patient presents with  . Follow-up    HPI Alexis Norris is a 53 y.o. male.  The patient is a 53 year old male who is referred by Dr. Renato Shin for evaluation of right inguinal hernia. The patient states the discomfort to his right inguinal area for past 6-7 months. The patient states that he's had some numbness in the area however is not otherwise had pain. He works at the [employer] and does a lot of heavy lifting.  HPI  Past Medical History  Diagnosis Date  . HYPERTENSION 01/13/2007  . HYPOTHYROIDISM, 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  . DYSLIPIDEMIA 01/13/2007    Past Surgical History  Procedure Laterality Date  . I-131 therapy  09/1997    Family History  Problem Relation Age of Onset  . Thyroid cancer Sister   . Heart attack Father   . Cancer Mother     Social History History  Substance Use Topics  . Smoking status: Never Smoker   . Smokeless tobacco: Not on file  . Alcohol Use: Not on file    Allergies  Allergen Reactions  . Simvastatin     REACTION: didn't feel well    Current Outpatient Prescriptions  Medication Sig Dispense Refill  . levothyroxine (SYNTHROID, LEVOTHROID) 112 MCG tablet 2 tabs daily  180 tablet  3  . triamterene-hydrochlorothiazide (MAXZIDE-25) 37.5-25 MG per tablet Take 1 tablet by mouth every other day.  30 tablet  5   No current facility-administered medications for this visit.    Review of Systems Review of Systems  Constitutional: Negative.   HENT: Negative.   Eyes: Negative.   Respiratory: Negative.   Cardiovascular: Negative.   Gastrointestinal: Negative.   Endocrine: Negative.   Neurological: Negative.     Weight 261 lb (118.389 kg).  Physical Exam Physical Exam  Constitutional: He is oriented to person, place, and time. He appears well-developed and well-nourished.  HENT:  Head: Normocephalic and atraumatic.  Eyes:  Conjunctivae and EOM are normal. Pupils are equal, round, and reactive to light.  Neck: Normal range of motion. Neck supple.  Cardiovascular: Normal rate, regular rhythm and normal heart sounds.   Pulmonary/Chest: Effort normal and breath sounds normal.  Abdominal: Soft. Bowel sounds are normal. He exhibits no distension and no mass. There is no tenderness. There is no rebound and no guarding. A hernia is present. Hernia confirmed positive in the right inguinal area. Hernia confirmed negative in the left inguinal area.  Musculoskeletal: Normal range of motion.  Neurological: He is alert and oriented to person, place, and time.  Skin: Skin is warm and dry.    Data Reviewed none  Assessment    53 year old male with a right inguinal hernia likely indirect,     Plan    1. We'll proceed to the operating room for  An attempted laparoscopic right inguinal hernia repair with mesh also discussed the patient may have to convert to open right inguinal hernia repair. 2. All risks and benefits were discussed with the patient, to generally include infection, bleeding, damage to surrounding structures, acute and chronic nerve pain, and recurrence. Alternatives were offered and described.  All questions were answered and the patient voiced understanding of the procedure and wishes to proceed at this point.         Ralene Ok 09/10/2013, 5:12 PM

## 2013-09-24 ENCOUNTER — Encounter (HOSPITAL_COMMUNITY): Payer: Self-pay

## 2013-09-24 ENCOUNTER — Ambulatory Visit (HOSPITAL_COMMUNITY)
Admission: RE | Admit: 2013-09-24 | Discharge: 2013-09-24 | Disposition: A | Discharge status: Home / Self Care | Payer: 59 | Source: Ambulatory Visit | Attending: Anesthesiology | Admitting: Anesthesiology

## 2013-09-24 ENCOUNTER — Encounter (HOSPITAL_COMMUNITY)
Admission: RE | Admit: 2013-09-24 | Discharge: 2013-09-24 | Disposition: A | Discharge status: Home / Self Care | Payer: 59 | Source: Ambulatory Visit | Attending: General Surgery | Admitting: General Surgery

## 2013-09-24 DIAGNOSIS — Z01812 Encounter for preprocedural laboratory examination: Secondary | ICD-10-CM | POA: Insufficient documentation

## 2013-09-24 DIAGNOSIS — Z01818 Encounter for other preprocedural examination: Secondary | ICD-10-CM | POA: Insufficient documentation

## 2013-09-24 LAB — BASIC METABOLIC PANEL
BUN: 15 mg/dL (ref 6–23)
CHLORIDE: 103 meq/L (ref 96–112)
CO2: 25 meq/L (ref 19–32)
Calcium: 9.1 mg/dL (ref 8.4–10.5)
Creatinine, Ser: 0.98 mg/dL (ref 0.50–1.35)
GFR calc Af Amer: >90 mL/min (ref >90)
GFR calc non Af Amer: >90 mL/min (ref >90)
GLUCOSE: 93 mg/dL (ref 70–99)
POTASSIUM: 3.8 meq/L (ref 3.7–5.3)
SODIUM: 141 meq/L (ref 137–147)

## 2013-09-24 LAB — CBC
HCT: 40.8 % (ref 39.0–52.0)
HEMOGLOBIN: 14.2 g/dL (ref 13.0–17.0)
MCH: 29.6 pg (ref 26.0–34.0)
MCHC: 34.8 g/dL (ref 30.0–36.0)
MCV: 85 fL (ref 78.0–100.0)
PLATELETS: 234 10*3/uL (ref 150–400)
RBC: 4.8 MIL/uL (ref 4.22–5.81)
RDW: 14.4 % (ref 11.5–15.5)
WBC: 6.9 10*3/uL (ref 4.0–10.5)

## 2013-09-24 NOTE — Pre-Procedure Instructions (Signed)
[name]  09/24/2013   Your procedure is scheduled on:  Tuesday, June 2nd   Report to [facility] Admitting at   AM.  Call this number if you have problems the morning of surgery: [phone]   Remember:   Do not eat food or drink liquids after midnight Monday .   Take these medicines the morning of surgery with A SIP OF WATER:  Levothyroxine   Do not wear jewelry-no watches or rings.  Do not wear lotions or colognes.  You may NOT wear deodorant.   Men may shave face and neck.   Do not bring valuables to the hospital.  [facility] is not responsible for any belongings or valuables.               Contacts, dentures or bridgework may not be worn into surgery.  Leave suitcase in the car. After surgery it may be brought to your room.                Patients discharged the day of surgery will not be allowed to drive home and will need someone to stay with you for 24 hrs afterwards.    Name and phone number of your driver:   [name]  --  WIFE   Special Instructions: "Preparing for Surgery" instruction sheet.   Please read over the following fact sheets that you were given: Pain Booklet and Surgical Site Infection Prevention

## 2013-09-27 MED ORDER — CEFAZOLIN SODIUM-DEXTROSE 2-3 GM-% IV SOLR
2.0000 g | INTRAVENOUS | Status: AC
  Administered 2013-09-28: 2 g via INTRAVENOUS
  Filled 2013-09-27: qty 50

## 2013-09-28 ENCOUNTER — Ambulatory Visit (HOSPITAL_COMMUNITY): Payer: 59 | Admitting: Certified Registered"

## 2013-09-28 ENCOUNTER — Encounter (HOSPITAL_COMMUNITY): Payer: 59 | Admitting: Certified Registered"

## 2013-09-28 ENCOUNTER — Encounter (HOSPITAL_COMMUNITY)
Admission: RE | Disposition: A | Discharge status: Home / Self Care | Payer: Self-pay | Source: Ambulatory Visit | Attending: General Surgery

## 2013-09-28 ENCOUNTER — Ambulatory Visit (HOSPITAL_COMMUNITY)
Admission: RE | Admit: 2013-09-28 | Discharge: 2013-09-28 | Disposition: A | Discharge status: Home / Self Care | Payer: 59 | Source: Ambulatory Visit | Attending: General Surgery | Admitting: General Surgery

## 2013-09-28 ENCOUNTER — Encounter (HOSPITAL_COMMUNITY): Payer: Self-pay | Admitting: *Deleted

## 2013-09-28 DIAGNOSIS — K4090 Unilateral inguinal hernia, without obstruction or gangrene, not specified as recurrent: Secondary | ICD-10-CM

## 2013-09-28 DIAGNOSIS — E785 Hyperlipidemia, unspecified: Secondary | ICD-10-CM | POA: Insufficient documentation

## 2013-09-28 DIAGNOSIS — Z6832 Body mass index (BMI) 32.0-32.9, adult: Secondary | ICD-10-CM | POA: Insufficient documentation

## 2013-09-28 DIAGNOSIS — E890 Postprocedural hypothyroidism: Secondary | ICD-10-CM | POA: Insufficient documentation

## 2013-09-28 DIAGNOSIS — E669 Obesity, unspecified: Secondary | ICD-10-CM | POA: Insufficient documentation

## 2013-09-28 DIAGNOSIS — Z79899 Other long term (current) drug therapy: Secondary | ICD-10-CM | POA: Insufficient documentation

## 2013-09-28 DIAGNOSIS — I10 Essential (primary) hypertension: Secondary | ICD-10-CM | POA: Insufficient documentation

## 2013-09-28 HISTORY — PX: INSERTION OF MESH: SHX5868

## 2013-09-28 HISTORY — PX: INGUINAL HERNIA REPAIR: SHX194

## 2013-09-28 SURGERY — REPAIR, HERNIA, INGUINAL, LAPAROSCOPIC
Anesthesia: General | Site: Groin | Laterality: Right

## 2013-09-28 MED ORDER — ROCURONIUM BROMIDE 50 MG/5ML IV SOLN
INTRAVENOUS | Status: AC
  Filled 2013-09-28: qty 1

## 2013-09-28 MED ORDER — LIDOCAINE HCL (CARDIAC) 20 MG/ML IV SOLN
INTRAVENOUS | Status: DC | PRN
  Administered 2013-09-28: 100 mg via INTRAVENOUS

## 2013-09-28 MED ORDER — HYDROMORPHONE HCL PF 1 MG/ML IJ SOLN
INTRAMUSCULAR | Status: AC
  Filled 2013-09-28: qty 1

## 2013-09-28 MED ORDER — FENTANYL CITRATE 0.05 MG/ML IJ SOLN
INTRAMUSCULAR | Status: DC | PRN
  Administered 2013-09-28: 100 ug via INTRAVENOUS
  Administered 2013-09-28: 50 ug via INTRAVENOUS
  Administered 2013-09-28: 100 ug via INTRAVENOUS

## 2013-09-28 MED ORDER — ARTIFICIAL TEARS OP OINT
TOPICAL_OINTMENT | OPHTHALMIC | Status: AC
  Filled 2013-09-28: qty 3.5

## 2013-09-28 MED ORDER — PROPOFOL 10 MG/ML IV BOLUS
INTRAVENOUS | Status: AC
  Filled 2013-09-28: qty 20

## 2013-09-28 MED ORDER — NEOSTIGMINE METHYLSULFATE 10 MG/10ML IV SOLN
INTRAVENOUS | Status: DC | PRN
  Administered 2013-09-28: 3 mg via INTRAVENOUS

## 2013-09-28 MED ORDER — GLYCOPYRROLATE 0.2 MG/ML IJ SOLN
INTRAMUSCULAR | Status: DC | PRN
Start: 2013-09-28 — End: 2013-09-28
  Administered 2013-09-28: 0.4 mg via INTRAVENOUS

## 2013-09-28 MED ORDER — ROCURONIUM BROMIDE 100 MG/10ML IV SOLN
INTRAVENOUS | Status: DC | PRN
  Administered 2013-09-28: 50 mg via INTRAVENOUS

## 2013-09-28 MED ORDER — LIDOCAINE HCL (CARDIAC) 20 MG/ML IV SOLN
INTRAVENOUS | Status: AC
  Filled 2013-09-28: qty 5

## 2013-09-28 MED ORDER — NEOSTIGMINE METHYLSULFATE 10 MG/10ML IV SOLN
INTRAVENOUS | Status: AC
  Filled 2013-09-28: qty 1

## 2013-09-28 MED ORDER — MIDAZOLAM HCL 5 MG/5ML IJ SOLN
INTRAMUSCULAR | Status: DC | PRN
  Administered 2013-09-28: 2 mg via INTRAVENOUS

## 2013-09-28 MED ORDER — OXYCODONE HCL 5 MG PO TABS
5.0000 mg | ORAL_TABLET | Freq: Once | ORAL | Status: AC | PRN
  Administered 2013-09-28: 5 mg via ORAL

## 2013-09-28 MED ORDER — OXYCODONE HCL 5 MG/5ML PO SOLN: 5.0000 mg | Freq: Once | ORAL | Status: AC | PRN

## 2013-09-28 MED ORDER — ONDANSETRON HCL 4 MG/2ML IJ SOLN
INTRAMUSCULAR | Status: DC | PRN
  Administered 2013-09-28: 4 mg via INTRAVENOUS

## 2013-09-28 MED ORDER — HYDROMORPHONE HCL PF 1 MG/ML IJ SOLN
0.2500 mg | INTRAMUSCULAR | Status: DC | PRN
Start: 2013-09-28 — End: 2013-09-28
  Administered 2013-09-28: 0.5 mg via INTRAVENOUS

## 2013-09-28 MED ORDER — FENTANYL CITRATE 0.05 MG/ML IJ SOLN
INTRAMUSCULAR | Status: AC
  Filled 2013-09-28: qty 5

## 2013-09-28 MED ORDER — ONDANSETRON HCL 4 MG/2ML IJ SOLN
INTRAMUSCULAR | Status: AC
  Filled 2013-09-28: qty 2

## 2013-09-28 MED ORDER — 0.9 % SODIUM CHLORIDE (POUR BTL) OPTIME
TOPICAL | Status: DC | PRN
  Administered 2013-09-28: 1000 mL

## 2013-09-28 MED ORDER — MIDAZOLAM HCL 2 MG/2ML IJ SOLN
INTRAMUSCULAR | Status: AC
  Filled 2013-09-28: qty 2

## 2013-09-28 MED ORDER — LACTATED RINGERS IV SOLN
INTRAVENOUS | Status: DC
  Administered 2013-09-28 (×2): via INTRAVENOUS

## 2013-09-28 MED ORDER — GLYCOPYRROLATE 0.2 MG/ML IJ SOLN
INTRAMUSCULAR | Status: AC
  Filled 2013-09-28: qty 2

## 2013-09-28 MED ORDER — OXYCODONE HCL 5 MG PO TABS
ORAL_TABLET | ORAL | Status: AC
  Filled 2013-09-28: qty 1

## 2013-09-28 MED ORDER — BUPIVACAINE HCL 0.25 % IJ SOLN
INTRAMUSCULAR | Status: DC | PRN
  Administered 2013-09-28: 8 mL

## 2013-09-28 MED ORDER — CHLORHEXIDINE GLUCONATE 4 % EX LIQD
1.0000 "application " | Freq: Once | CUTANEOUS | Status: DC
  Filled 2013-09-28: qty 15

## 2013-09-28 MED ORDER — ONDANSETRON HCL 4 MG/2ML IJ SOLN: 4.0000 mg | Freq: Four times a day (QID) | INTRAMUSCULAR | Status: DC | PRN

## 2013-09-28 MED ORDER — OXYCODONE-ACETAMINOPHEN 5-325 MG PO TABS: 1.0000 | ORAL_TABLET | ORAL | Status: DC | PRN

## 2013-09-28 MED ORDER — PROPOFOL 10 MG/ML IV BOLUS
INTRAVENOUS | Status: DC | PRN
  Administered 2013-09-28: 200 mg via INTRAVENOUS

## 2013-09-28 SURGICAL SUPPLY — 47 items
APL SKNCLS STERI-STRIP NONHPOA (GAUZE/BANDAGES/DRESSINGS) ×1
APPLIER CLIP 5 13 M/L LIGAMAX5 (MISCELLANEOUS)
BENZOIN TINCTURE PRP APPL 2/3 (GAUZE/BANDAGES/DRESSINGS) ×4 IMPLANT
CANISTER SUCTION 2500CC (MISCELLANEOUS) IMPLANT
CHLORAPREP W/TINT 26ML (MISCELLANEOUS) ×4 IMPLANT
CLIP APPLIE 5 13 M/L LIGAMAX5 (MISCELLANEOUS) IMPLANT
COVER SURGICAL LIGHT HANDLE (MISCELLANEOUS) ×4 IMPLANT
DISSECT BALLN SPACEMKR + OVL (BALLOONS)
DISSECTOR BALLN SPACEMKR + OVL (BALLOONS) IMPLANT
DISSECTOR BLUNT TIP ENDO 5MM (MISCELLANEOUS) IMPLANT
DRAPE UTILITY 15X26 W/TAPE STR (DRAPE) ×8 IMPLANT
ELECT REM PT RETURN 9FT ADLT (ELECTROSURGICAL) ×4
ELECTRODE REM PT RTRN 9FT ADLT (ELECTROSURGICAL) ×2 IMPLANT
GAUZE SPONGE 2X2 8PLY STRL LF (GAUZE/BANDAGES/DRESSINGS) ×2 IMPLANT
GLOVE BIO SURGEON STRL SZ7.5 (GLOVE) ×4 IMPLANT
GLOVE BIO SURGEON STRL SZ8 (GLOVE) ×4 IMPLANT
GLOVE BIOGEL PI IND STRL 7.0 (GLOVE) ×2 IMPLANT
GLOVE BIOGEL PI IND STRL 8 (GLOVE) ×2 IMPLANT
GLOVE BIOGEL PI INDICATOR 7.0 (GLOVE) ×2
GLOVE BIOGEL PI INDICATOR 8 (GLOVE) ×2
GLOVE SS BIOGEL STRL SZ 6.5 (GLOVE) ×4 IMPLANT
GLOVE SUPERSENSE BIOGEL SZ 6.5 (GLOVE) ×4
GOWN STRL REUS W/ TWL LRG LVL3 (GOWN DISPOSABLE) ×6 IMPLANT
GOWN STRL REUS W/ TWL XL LVL3 (GOWN DISPOSABLE) ×2 IMPLANT
GOWN STRL REUS W/TWL LRG LVL3 (GOWN DISPOSABLE) ×9
GOWN STRL REUS W/TWL XL LVL3 (GOWN DISPOSABLE) ×2
KIT BASIN OR (CUSTOM PROCEDURE TRAY) ×4 IMPLANT
KIT ROOM TURNOVER OR (KITS) ×4 IMPLANT
MESH 3DMAX 4X6 RT LRG (Mesh General) ×4 IMPLANT
NEEDLE INSUFFLATION 14GA 120MM (NEEDLE) ×4 IMPLANT
NS IRRIG 1000ML POUR BTL (IV SOLUTION) ×4 IMPLANT
PAD ARMBOARD 7.5X6 YLW CONV (MISCELLANEOUS) ×8 IMPLANT
RELOAD STAPLE HERNIA 4.0 BLUE (INSTRUMENTS) ×4 IMPLANT
RELOAD STAPLE HERNIA 4.8 BLK (STAPLE) IMPLANT
SCISSORS LAP 5X35 DISP (ENDOMECHANICALS) ×4 IMPLANT
SET IRRIG TUBING LAPAROSCOPIC (IRRIGATION / IRRIGATOR) IMPLANT
SET TROCAR LAP APPLE-HUNT 5MM (ENDOMECHANICALS) ×4 IMPLANT
SPONGE GAUZE 2X2 STER 10/PKG (GAUZE/BANDAGES/DRESSINGS) ×2
STAPLER HERNIA 12 8.5 360D (INSTRUMENTS) ×4 IMPLANT
SUT MNCRL AB 4-0 PS2 18 (SUTURE) ×4 IMPLANT
SUT VIC AB 1 CT1 27 (SUTURE)
SUT VIC AB 1 CT1 27XBRD ANBCTR (SUTURE) IMPLANT
TOWEL OR 17X24 6PK STRL BLUE (TOWEL DISPOSABLE) ×4 IMPLANT
TOWEL OR 17X26 10 PK STRL BLUE (TOWEL DISPOSABLE) ×4 IMPLANT
TRAY FOLEY CATH 16FR SILVER (SET/KITS/TRAYS/PACK) ×4 IMPLANT
TRAY LAPAROSCOPIC (CUSTOM PROCEDURE TRAY) ×4 IMPLANT
TROCAR XCEL 12X100 BLDLESS (ENDOMECHANICALS) ×4 IMPLANT

## 2013-09-28 NOTE — Op Note (Signed)
09/28/2013  10:29 AM  PATIENT:  Alexis Norris  53 y.o. male  PRE-OPERATIVE DIAGNOSIS:  right inguinal hernia   POST-OPERATIVE DIAGNOSIS:  right inguinal hernia   PROCEDURE:  Procedure(s): LAPAROSCOPIC RIGHT  INGUINAL HERNIA (Right) INSERTION OF MESH (Right)  SURGEON:  Surgeon(s) and Role:    * Ralene Ok, MD - Primary   ASSISTANTS: none   ANESTHESIA:   local and general  EBL:  Total I/O In: 1000 [I.V.:1000] Out: -   BLOOD ADMINISTERED:none  DRAINS: none   LOCAL MEDICATIONS USED:  BUPIVICAINE   SPECIMEN:  No Specimen  DISPOSITION OF SPECIMEN:  NA  COUNTS:  YES  TOURNIQUET:  * No tourniquets in log *  DICTATION: .Dragon Dictation  Details of the procedure: The patient was taken back to the operating room. The patient was placed in supine position with bilateral SCDs in place. After appropriate anitbiotics were confirmed, a time-out was confirmed and all facts were verified.  0.25% Marcaine was used to infiltrate the umbilical area. He was used to cut down the skin and blunt dissection was used to get the anterior fashion.  The anterior fascia was incised approximately 1 cm and the muscles were divided anteriorly. Blunt dissection was then used to create a space in the preperitoneal area. At this time a 10 mm camera was then introduced into the space and advanced the pubic tubercle and a 12 mm trocar was placed over this and insufflation was started.  At this time and space was created from medial to laterally the preperitoneal space. The hernia sac was identified in the indirect space. Dissection of the hernia sac was undertaken the vas deferens was identified and protected in all parts of the case.   Once the hernia sac was taken down to approximately the umbilicus a Bard 3D Max mesh was introduced into the preperitoneal space.  The mesh was brought over the hernia space defect and anchored into place and secured to Cooper's ligament with 4.82mm staples from a [place]  hernia stapler. It was anchored to the anterior abdominal wall with 4.8 mm staples. The hernia sac was seen lying anterior to the mesh. There was no staples placed laterally. The insufflation was evacuated. The trochars were removed. The anterior fascia was reapproximated using #1 Vicryl on a UR- 6 in a figure of eight fashion.  Intra-abdominal air was evacuated and the Veress needle removed. The skin was reapproximated using 4-0 Monocryl subcuticular fashion the patient was awakened from general anesthesia and taken to recovery in stable condition.   PLAN OF CARE: Discharge to home after PACU  PATIENT DISPOSITION:  PACU - hemodynamically stable.   Delay start of Pharmacological VTE agent (>24hrs) due to surgical blood loss or risk of bleeding: not applicable

## 2013-09-28 NOTE — H&P (View-Only) (Signed)
Patient ID: Alexis Norris, male   DOB: 06/23/1960, 52 y.o.   MRN: [id number]  Chief Complaint  Patient presents with  . Follow-up    HPI Alexis Norris is a 52 y.o. male.  The patient is a 52-year-old male who is referred by Dr. Sean Ellison for evaluation of right inguinal hernia. The patient states the discomfort to his right inguinal area for past 6-7 months. The patient states that he's had some numbness in the area however is not otherwise had pain. He works at the [employer] and does a lot of heavy lifting.  HPI  Past Medical History  Diagnosis Date  . HYPERTENSION 01/13/2007  . HYPOTHYROIDISM, 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  . DYSLIPIDEMIA 01/13/2007    Past Surgical History  Procedure Laterality Date  . I-131 therapy  09/1997    Family History  Problem Relation Age of Onset  . Thyroid cancer Sister   . Heart attack Father   . Cancer Mother     Social History History  Substance Use Topics  . Smoking status: Never Smoker   . Smokeless tobacco: Not on file  . Alcohol Use: Not on file    Allergies  Allergen Reactions  . Simvastatin     REACTION: didn't feel well    Current Outpatient Prescriptions  Medication Sig Dispense Refill  . levothyroxine (SYNTHROID, LEVOTHROID) 112 MCG tablet 2 tabs daily  180 tablet  3  . triamterene-hydrochlorothiazide (MAXZIDE-25) 37.5-25 MG per tablet Take 1 tablet by mouth every other day.  30 tablet  5   No current facility-administered medications for this visit.    Review of Systems Review of Systems  Constitutional: Negative.   HENT: Negative.   Eyes: Negative.   Respiratory: Negative.   Cardiovascular: Negative.   Gastrointestinal: Negative.   Endocrine: Negative.   Neurological: Negative.     Weight 261 lb (118.389 kg).  Physical Exam Physical Exam  Constitutional: He is oriented to person, place, and time. He appears well-developed and well-nourished.  HENT:  Head: Normocephalic and atraumatic.  Eyes:  Conjunctivae and EOM are normal. Pupils are equal, round, and reactive to light.  Neck: Normal range of motion. Neck supple.  Cardiovascular: Normal rate, regular rhythm and normal heart sounds.   Pulmonary/Chest: Effort normal and breath sounds normal.  Abdominal: Soft. Bowel sounds are normal. He exhibits no distension and no mass. There is no tenderness. There is no rebound and no guarding. A hernia is present. Hernia confirmed positive in the right inguinal area. Hernia confirmed negative in the left inguinal area.  Musculoskeletal: Normal range of motion.  Neurological: He is alert and oriented to person, place, and time.  Skin: Skin is warm and dry.    Data Reviewed none  Assessment    52-year-old male with a right inguinal hernia likely indirect,     Plan    1. We'll proceed to the operating room for  An attempted laparoscopic right inguinal hernia repair with mesh also discussed the patient may have to convert to open right inguinal hernia repair. 2. All risks and benefits were discussed with the patient, to generally include infection, bleeding, damage to surrounding structures, acute and chronic nerve pain, and recurrence. Alternatives were offered and described.  All questions were answered and the patient voiced understanding of the procedure and wishes to proceed at this point.         Shondrika Hoque 09/10/2013, 5:12 PM    

## 2013-09-28 NOTE — Interval H&P Note (Signed)
History and Physical Interval Note:  09/28/2013 9:06 AM  Alexis Norris  has presented today for surgery, with the diagnosis of right inguinal hernia   The various methods of treatment have been discussed with the patient and family. After consideration of risks, benefits and other options for treatment, the patient has consented to  Procedure(s): LAPAROSCOPIC possible open INGUINAL HERNIA (Right) INSERTION OF MESH (Right) as a surgical intervention .  The patient's history has been reviewed, patient examined, no change in status, stable for surgery.  I have reviewed the patient's chart and labs.  Questions were answered to the patient's satisfaction.     Ralene Ok

## 2013-09-28 NOTE — Anesthesia Preprocedure Evaluation (Addendum)
Anesthesia Evaluation  Patient identified by MRN, date of birth, ID band Patient awake    Reviewed: Allergy & Precautions, H&P , NPO status , Patient's Chart, lab work & pertinent test results  Airway Mallampati: II TM Distance: >3 FB Neck ROM: Full    Dental  (+) Teeth Intact   Pulmonary neg pulmonary ROS,          Cardiovascular hypertension, Pt. on medications Rhythm:Regular Rate:Normal     Neuro/Psych    GI/Hepatic   Endo/Other  Hypothyroidism obese  Renal/GU      Musculoskeletal   Abdominal   Peds  Hematology   Anesthesia Other Findings   Reproductive/Obstetrics                          Anesthesia Physical Anesthesia Plan  ASA: II  Anesthesia Plan: General   Post-op Pain Management:    Induction: Intravenous  Airway Management Planned: Oral ETT  Additional Equipment:   Intra-op Plan:   Post-operative Plan: Extubation in OR  Informed Consent: I have reviewed the patients History and Physical, chart, labs and discussed the procedure including the risks, benefits and alternatives for the proposed anesthesia with the patient or authorized representative who has indicated his/her understanding and acceptance.   Dental advisory given  Plan Discussed with: Anesthesiologist and Surgeon  Anesthesia Plan Comments:         Anesthesia Quick Evaluation

## 2013-09-28 NOTE — Transfer of Care (Signed)
Immediate Anesthesia Transfer of Care Note  Patient: Alexis Norris  Procedure(s) Performed: Procedure(s): LAPAROSCOPIC RIGHT  INGUINAL HERNIA (Right) INSERTION OF MESH (Right)  Patient Location: PACU  Anesthesia Type:General  Level of Consciousness: awake, alert  and oriented  Airway & Oxygen Therapy: Patient Spontanous Breathing  Post-op Assessment: Report given to PACU RN and Post -op Vital signs reviewed and stable  Post vital signs: Reviewed and stable  Complications: No apparent anesthesia complications

## 2013-09-28 NOTE — Anesthesia Postprocedure Evaluation (Signed)
Anesthesia Post Note  Patient: Alexis Norris  Procedure(s) Performed: Procedure(s) (LRB): LAPAROSCOPIC RIGHT  INGUINAL HERNIA (Right) INSERTION OF MESH (Right)  Anesthesia type: General  Patient location: PACU  Post pain: Pain level controlled and Adequate analgesia  Post assessment: Post-op Vital signs reviewed, Patient's Cardiovascular Status Stable, Respiratory Function Stable, Patent Airway and Pain level controlled  Last Vitals:  Filed Vitals:   09/28/13 1130  BP: 136/74  Pulse: 60  Temp:   Resp: 18    Post vital signs: Reviewed and stable  Level of consciousness: awake, alert  and oriented  Complications: No apparent anesthesia complications

## 2013-09-28 NOTE — Discharge Instructions (Signed)
HERNIA REPAIR: POST OP INSTRUCTIONS  1. DIET: Follow a light bland diet the first 24 hours after arrival home, such as soup, liquids, crackers, etc.  Be sure to include lots of fluids daily.  Avoid fast food or heavy meals as your are more likely to get nauseated.  Eat a low fat the next few days after surgery. 2. Take your usually prescribed home medications unless otherwise directed. 3. PAIN CONTROL: a. Pain is best controlled by a usual combination of three different methods TOGETHER: i. Ice/Heat ii. Over the counter pain medication iii. Prescription pain medication b. Most patients will experience some swelling and bruising around the hernia(s) such as the bellybutton, groins, or old incisions.  Ice packs or heating pads (30-60 minutes up to 6 times a day) will help. Use ice for the first few days to help decrease swelling and bruising, then switch to heat to help relax tight/sore spots and speed recovery.  Some people prefer to use ice alone, heat alone, alternating between ice & heat.  Experiment to what works for you.  Swelling and bruising can take several weeks to resolve.   c. It is helpful to take an over-the-counter pain medication regularly for the first few weeks.  Choose one of the following that works best for you: i. Naproxen (Aleve, etc)  Two 220mg tabs twice a day ii. Ibuprofen (Advil, etc) Three 200mg tabs four times a day (every meal & bedtime) iii. Acetaminophen (Tylenol, etc) 325-650mg four times a day (every meal & bedtime) d. A  prescription for pain medication should be given to you upon discharge.  Take your pain medication as prescribed.  i. If you are having problems/concerns with the prescription medicine (does not control pain, nausea, vomiting, rash, itching, etc), please call us [phone] to see if we need to switch you to a different pain medicine that will work better for you and/or control your side effect better. ii. If you need a refill on your pain  medication, please contact your pharmacy.  They will contact our office to request authorization. Prescriptions will not be filled after 5 pm or on week-ends. 4. Avoid getting constipated.  Between the surgery and the pain medications, it is common to experience some constipation.  Increasing fluid intake and taking a fiber supplement (such as Metamucil, Citrucel, FiberCon, MiraLax, etc) 1-2 times a day regularly will usually help prevent this problem from occurring.  A mild laxative (prune juice, Milk of Magnesia, MiraLax, etc) should be taken according to package directions if there are no bowel movements after 48 hours.   5. Wash / shower every day.  You may shower over the dressings as they are waterproof.   6. Remove your waterproof bandages 5 days after surgery.  You may leave the incision open to air.  You may replace a dressing/Band-Aid to cover the incision for comfort if you wish.  Continue to shower over incision(s) after the dressing is off.    7. ACTIVITIES as tolerated:   a. You may resume regular (light) daily activities beginning the next day-such as daily self-care, walking, climbing stairs-gradually increasing activities as tolerated.  If you can walk 30 minutes without difficulty, it is safe to try more intense activity such as jogging, treadmill, bicycling, low-impact aerobics, swimming, etc. b. Save the most intensive and strenuous activity for last such as sit-ups, heavy lifting, contact sports, etc  Refrain from any heavy lifting or straining until you are off narcotics for pain control.     c. DO NOT PUSH THROUGH PAIN.  Let pain be your guide: If it hurts to do something, don't do it.  Pain is your body warning you to avoid that activity for another week until the pain goes down. d. You may drive when you are no longer taking prescription pain medication, you can comfortably wear a seatbelt, and you can safely maneuver your car and apply brakes. e. You may have sexual intercourse  when it is comfortable.  8. FOLLOW UP in our office a. Please call [facility] at [phone] to set up an appointment to see your surgeon in the office for a follow-up appointment approximately 2-3 weeks after your surgery. b. Make sure that you call for this appointment the day you arrive home to insure a convenient appointment time. 9.  IF YOU HAVE DISABILITY OR FAMILY LEAVE FORMS, BRING THEM TO THE OFFICE FOR PROCESSING.  DO NOT GIVE THEM TO YOUR DOCTOR.  WHEN TO CALL US [phone]: 1. Poor pain control 2. Reactions / problems with new medications (rash/itching, nausea, etc)  3. Fever over 101.5 F (38.5 C) 4. Inability to urinate 5. Nausea and/or vomiting 6. Worsening swelling or bruising 7. Continued bleeding from incision. 8. Increased pain, redness, or drainage from the incision   The clinic staff is available to answer your questions during regular business hours (8:30am-5pm).  Please don't hesitate to call and ask to speak to one of our nurses for clinical concerns.   If you have a medical emergency, go to the nearest emergency room or call 911.  A surgeon from [facility] is always on call at the hospitals in [place]  [facility] [address], [address], [place], [place]  [address] ?  [address] [address], [place], [place]   [address] MAIN: [phone] ? TOLL FREE: [phone] ? FAX: [phone] [email or web address]  

## 2013-10-01 ENCOUNTER — Telehealth (INDEPENDENT_AMBULATORY_CARE_PROVIDER_SITE_OTHER): Payer: Self-pay

## 2013-10-01 ENCOUNTER — Encounter (HOSPITAL_COMMUNITY): Payer: Self-pay | Admitting: General Surgery

## 2013-10-01 NOTE — Telephone Encounter (Signed)
Pt is post inguinal hernia repair on 09/28/13 by Dr. Rosendo Gros.  He is calling today very concerned about scrotal bruising and swelling.  I assured the patient that this was a normal post op side effect from the surgery.  He should apply ice 15 minutes on, 15 minutes off for the next day or so to help with the swelling.  This should resolve in several days.  Pt understood and agreed to POC.

## 2013-10-13 ENCOUNTER — Encounter (INDEPENDENT_AMBULATORY_CARE_PROVIDER_SITE_OTHER): Payer: Self-pay

## 2013-10-13 ENCOUNTER — Encounter (INDEPENDENT_AMBULATORY_CARE_PROVIDER_SITE_OTHER): Payer: Self-pay | Admitting: General Surgery

## 2013-10-13 ENCOUNTER — Ambulatory Visit (INDEPENDENT_AMBULATORY_CARE_PROVIDER_SITE_OTHER): Payer: 59 | Admitting: General Surgery

## 2013-10-13 VITALS — BP 136/82 | HR 81 | Temp 98.4°F | Ht 75.0 in | Wt 268.0 lb

## 2013-10-13 DIAGNOSIS — Z9889 Other specified postprocedural states: Secondary | ICD-10-CM

## 2013-10-13 DIAGNOSIS — Z8719 Personal history of other diseases of the digestive system: Secondary | ICD-10-CM

## 2013-10-13 NOTE — Progress Notes (Signed)
Patient ID: Alexis Norris, male   DOB: [birth year], 53 y.o.   MRN: [id number] Post op course The patient is a 53 year old male status post laparoscopic right inguinal hernia repair with mesh. Patient has been doing well postoperatively. He had some bruising and some swelling which has subsided.  On Exam: Wounds are clean dry and intact, there is no hernial on palpation, there is no ecchymosis, no swelling   Assessment and Plan 53 year old male status post lap right inguinal hernia repair with mesh 1. The patient follow up as needed 2. We discussed weight lifter extractions of 20 pounds. For a total of 6 weeks 3. Patient will have a work note to excuse him to work for 6 weeks.   Ralene Ok, MD [facility] General & [facility]imally Invasive Surgery Trauma & Emergency Surgery

## 2013-10-19 ENCOUNTER — Telehealth (INDEPENDENT_AMBULATORY_CARE_PROVIDER_SITE_OTHER): Payer: Self-pay

## 2013-10-19 NOTE — Telephone Encounter (Signed)
Pt called wanting to return to work on 11-01-13 with no lifting above 15 lbs until 11-05-13. Pt can be reached at [phone]. Pt aware we will send this request to Dr Rosendo Gros.

## 2013-10-22 ENCOUNTER — Encounter (INDEPENDENT_AMBULATORY_CARE_PROVIDER_SITE_OTHER): Payer: Self-pay | Admitting: General Surgery

## 2013-12-15 ENCOUNTER — Other Ambulatory Visit: Payer: Self-pay | Admitting: Endocrinology

## 2013-12-15 NOTE — Telephone Encounter (Signed)
Levothyroxine refilled

## 2014-01-17 ENCOUNTER — Other Ambulatory Visit: Payer: Self-pay

## 2014-01-17 MED ORDER — TRIAMTERENE-HCTZ 37.5-25 MG PO TABS: 1.0000 | ORAL_TABLET | ORAL | Status: DC

## 2014-02-25 ENCOUNTER — Encounter: Payer: Self-pay | Admitting: Endocrinology

## 2014-02-25 ENCOUNTER — Ambulatory Visit (INDEPENDENT_AMBULATORY_CARE_PROVIDER_SITE_OTHER): Payer: 59 | Admitting: Endocrinology

## 2014-02-25 VITALS — BP 148/94 | HR 78 | Temp 98.4°F | Ht 75.0 in | Wt 271.0 lb

## 2014-02-25 DIAGNOSIS — E785 Hyperlipidemia, unspecified: Secondary | ICD-10-CM

## 2014-02-25 DIAGNOSIS — Z0000 Encounter for general adult medical examination without abnormal findings: Secondary | ICD-10-CM

## 2014-02-25 DIAGNOSIS — I10 Essential (primary) hypertension: Secondary | ICD-10-CM

## 2014-02-25 DIAGNOSIS — Z125 Encounter for screening for malignant neoplasm of prostate: Secondary | ICD-10-CM

## 2014-02-25 DIAGNOSIS — E890 Postprocedural hypothyroidism: Secondary | ICD-10-CM

## 2014-02-25 LAB — URINALYSIS, ROUTINE W REFLEX MICROSCOPIC
Bilirubin Urine: NEGATIVE
Hgb urine dipstick: NEGATIVE
KETONES UR: NEGATIVE
Leukocytes, UA: NEGATIVE
Nitrite: NEGATIVE
SPECIFIC GRAVITY, URINE: 1.025 (ref 1.000–1.030)
Total Protein, Urine: NEGATIVE
URINE GLUCOSE: NEGATIVE
Urobilinogen, UA: 0.2 (ref 0.0–1.0)
pH: 6 (ref 5.0–8.0)

## 2014-02-25 LAB — LIPID PANEL
Cholesterol: 176 mg/dL (ref 0–200)
HDL: 39.9 mg/dL (ref >39.00)
LDL CALC: 117 mg/dL — AB (ref 0–99)
NonHDL: 136.1
TRIGLYCERIDES: 98 mg/dL (ref 0.0–149.0)
Total CHOL/HDL Ratio: 4
VLDL: 19.6 mg/dL (ref 0.0–40.0)

## 2014-02-25 LAB — CBC WITH DIFFERENTIAL/PLATELET
Basophils Absolute: 0 10*3/uL (ref 0.0–0.1)
Basophils Relative: 0.6 % (ref 0.0–3.0)
EOS PCT: 3 % (ref 0.0–5.0)
Eosinophils Absolute: 0.2 10*3/uL (ref 0.0–0.7)
HCT: 44.9 % (ref 39.0–52.0)
HEMOGLOBIN: 15.1 g/dL (ref 13.0–17.0)
Lymphocytes Relative: 23.5 % (ref 12.0–46.0)
Lymphs Abs: 1.7 10*3/uL (ref 0.7–4.0)
MCHC: 33.7 g/dL (ref 30.0–36.0)
MCV: 85.4 fl (ref 78.0–100.0)
MONOS PCT: 5.1 % (ref 3.0–12.0)
Monocytes Absolute: 0.4 10*3/uL (ref 0.1–1.0)
NEUTROS ABS: 4.9 10*3/uL (ref 1.4–7.7)
Neutrophils Relative %: 67.8 % (ref 43.0–77.0)
Platelets: 262 10*3/uL (ref 150.0–400.0)
RBC: 5.25 Mil/uL (ref 4.22–5.81)
RDW: 13.7 % (ref 11.5–15.5)
WBC: 7.2 10*3/uL (ref 4.0–10.5)

## 2014-02-25 LAB — HEPATIC FUNCTION PANEL
ALK PHOS: 64 U/L (ref 39–117)
ALT: 24 U/L (ref 0–53)
AST: 23 U/L (ref 0–37)
Albumin: 3.7 g/dL (ref 3.5–5.2)
Bilirubin, Direct: 0.1 mg/dL (ref 0.0–0.3)
Total Bilirubin: 0.6 mg/dL (ref 0.2–1.2)
Total Protein: 7.2 g/dL (ref 6.0–8.3)

## 2014-02-25 LAB — BASIC METABOLIC PANEL
BUN: 15 mg/dL (ref 6–23)
CHLORIDE: 109 meq/L (ref 96–112)
CO2: 21 meq/L (ref 19–32)
Calcium: 8.9 mg/dL (ref 8.4–10.5)
Creatinine, Ser: 1 mg/dL (ref 0.4–1.5)
GFR: 80.02 mL/min (ref >60.00)
Glucose, Bld: 93 mg/dL (ref 70–99)
Potassium: 3.8 mEq/L (ref 3.5–5.1)
SODIUM: 141 meq/L (ref 135–145)

## 2014-02-25 LAB — TSH: TSH: 6.73 u[IU]/mL — AB (ref 0.35–4.50)

## 2014-02-25 LAB — PSA: PSA: 0.67 ng/mL (ref 0.10–4.00)

## 2014-02-25 MED ORDER — LEVOTHYROXINE SODIUM 112 MCG PO TABS: 224.0000 ug | ORAL_TABLET | Freq: Every day | ORAL | Status: DC

## 2014-02-25 MED ORDER — LOSARTAN POTASSIUM-HCTZ 50-12.5 MG PO TABS: 1.0000 | ORAL_TABLET | Freq: Every day | ORAL | Status: DC

## 2014-02-25 NOTE — Patient Instructions (Addendum)
i have sent a prescription to your pharmacy, to change your blood pressure pill.   Please recheck the blood pressure at home, 2 weeks after the change.  Call if over 140 upper # or 90 lower #. blood tests are being requested for you today.  We'll contact you with results. please consider these measures for your health:  minimize alcohol.  do not use tobacco products.  have a colonoscopy at least every 10 years from age 53.  keep firearms safely stored.  always use seat belts.  have working smoke alarms in your home.  see an eye doctor and dentist regularly.  never drive under the influence of alcohol or drugs (including prescription drugs).  those with fair skin should take precautions against the sun. Please return in 1 year.

## 2014-02-25 NOTE — Progress Notes (Signed)
Subjective:    Patient ID: Alexis Norris, male    DOB: 1960/08/08, 53 y.o.   MRN: [id number]  HPI Pt is here for regular wellness examination, and is feeling pretty well in general, and says chronic med probs are stable, except as noted below Past Medical History  Diagnosis Date  . HYPERTENSION 01/13/2007  . HYPOTHYROIDISM, 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  . DYSLIPIDEMIA 01/13/2007    Past Surgical History  Procedure Laterality Date  . I-131 therapy  09/1997  . Right knuckle      pin placed in high school  . Inguinal hernia repair Right 09/28/2013    Procedure: LAPAROSCOPIC RIGHT  INGUINAL HERNIA;  Surgeon: Ralene Ok, MD;  Location: [facility];  Service: General;  Laterality: Right;  . Insertion of mesh Right 09/28/2013    Procedure: INSERTION OF MESH;  Surgeon: Ralene Ok, MD;  Location: [facility];  Service: General;  Laterality: Right;    History   Social History  . Marital Status: Married    Spouse Name: N/A    Number of Children: N/A  . Years of Education: N/A   Occupational History  . Tour manager    Social History Main Topics  . Smoking status: Never Smoker   . Smokeless tobacco: Not on file  . Alcohol Use: No  . Drug Use: No  . Sexual Activity: Not on file   Other Topics Concern  . Not on file   Social History Narrative   Works Marine scientist    No current outpatient prescriptions on file prior to visit.   No current facility-administered medications on file prior to visit.    Allergies  Allergen Reactions  . Simvastatin     REACTION: didn't feel well    Family History  Problem Relation Age of Onset  . Thyroid cancer Sister   . Heart attack Father   . Cancer Mother     BP 148/94  Pulse 78  Temp(Src) 98.4 F (36.9 C) (Oral)  Ht 6\' 3"  (1.905 m)  Wt 271 lb (122.925 kg)  BMI 33.87 kg/m2  SpO2 97%  Review of Systems  Constitutional: Negative for fever.  HENT: Negative for hearing loss.   Eyes: Negative for visual disturbance.  Cardiovascular:  Negative for chest pain.  Gastrointestinal: Negative for anal bleeding.  Endocrine: Negative for cold intolerance.  Genitourinary: Negative for hematuria and difficulty urinating.  Musculoskeletal: Negative for back pain.  Skin: Negative for rash.  Allergic/Immunologic: Positive for environmental allergies.  Neurological: Negative for numbness.  Hematological: Does not bruise/bleed easily.  Psychiatric/Behavioral: Negative for dysphoric mood.       Objective:   Physical Exam VS: see vs page GEN: no distress HEAD: head: no deformity eyes: no periorbital swelling; mild bilat proptosis.   external nose and ears are normal mouth: no lesion seen NECK: supple, thyroid is not enlarged CHEST WALL: no deformity LUNGS: clear to auscultation BREASTS:  No gynecomastia CV: reg rate and rhythm, no murmur ABD: abdomen is soft, nontender.  no hepatosplenomegaly.  not distended.  no hernia RECTAL: normal external and internal exam.  heme neg. PROSTATE:  Normal size.  No nodule MUSCULOSKELETAL: muscle bulk and strength are grossly normal.  no obvious joint swelling.  gait is normal and steady EXTEMITIES: no deformity.  no ulcer on the feet.  feet are of normal color and temp.   PULSES: dorsalis pedis intact bilat.  no carotid bruit NEURO:  cn 2-12 grossly intact.   readily moves all 4's.  sensation is intact  to touch on the feet SKIN:  Normal texture and temperature.  No rash or suspicious lesion is visible.   NODES:  None palpable at the neck PSYCH: alert, well-oriented.  Does not appear anxious nor depressed.       Assessment & Plan:  Wellness visit today, with problems stable, except as noted.    SEPARATE EVALUATION FOLLOWS--EACH PROBLEM HERE IS NEW, NOT RESPONDING TO TREATMENT, OR POSES SIGNIFICANT RISK TO THE PATIENT'S HEALTH: HISTORY OF THE PRESENT ILLNESS: Pt returns for f/u of HTN.  He takes meds as rx'ed.  Denies LOC PAST MEDICAL HISTORY reviewed and up to date today.     REVIEW OF SYSTEMS: He has gained a few lbs.  Denies sob.   PHYSICAL EXAMINATION: VITAL SIGNS:  See vs page.  GENERAL: no distress Ext: trace bilat leg edema.   LAB/XRAY RESULTS: i reviewed electrocardiogram.  Lab Results  Component Value Date   TSH 6.73* 02/25/2014  IMPRESSION: HTN: he needs increased rx.   Hypothyroidism: TSH has fluctuated.  PLAN:  As TSH has fluctuated, please continue the same thyroid medication. i have sent a prescription to your pharmacy, to change maxzide to hyzaar.

## 2014-04-04 ENCOUNTER — Encounter: Payer: Self-pay | Admitting: Endocrinology

## 2014-05-06 ENCOUNTER — Telehealth: Payer: Self-pay | Admitting: Endocrinology

## 2014-05-06 NOTE — Telephone Encounter (Signed)
Called and spoke with pt. He states that since Christmas he has had congestion and sinus pressure. Pt is going to the Saturday clinic at [facility] to see MD.

## 2014-05-06 NOTE — Telephone Encounter (Signed)
Patient states that he has been sick since Christmas and not getting any better   Is there anything that Dr. Loanne Drilling can call in for him at his pharmacy?   Call back [phone]   Thank you

## 2014-05-07 ENCOUNTER — Ambulatory Visit (INDEPENDENT_AMBULATORY_CARE_PROVIDER_SITE_OTHER): Payer: Commercial Managed Care - PPO | Admitting: Family Medicine

## 2014-05-07 ENCOUNTER — Encounter: Payer: Self-pay | Admitting: Family Medicine

## 2014-05-07 VITALS — BP 140/86 | HR 88 | Temp 98.4°F | Resp 20 | Ht 75.0 in | Wt 266.5 lb

## 2014-05-07 DIAGNOSIS — A499 Bacterial infection, unspecified: Secondary | ICD-10-CM

## 2014-05-07 DIAGNOSIS — J329 Chronic sinusitis, unspecified: Secondary | ICD-10-CM

## 2014-05-07 DIAGNOSIS — B9689 Other specified bacterial agents as the cause of diseases classified elsewhere: Secondary | ICD-10-CM

## 2014-05-07 MED ORDER — AZITHROMYCIN 250 MG PO TABS: ORAL_TABLET | ORAL | Status: DC

## 2014-05-07 NOTE — Patient Instructions (Signed)
Need to follow up with primary are doctor as  Blood pressure slightly elevated today but may simply be due to not feeling well.   Bacterial sinusitis based off tenderness and >10 days of symptoms and improvement then worsening. Follow up with your PCP if your symptoms do not resolve on azithromycin or if symptoms worsen. Follow up immediately if shortness of breath.

## 2014-05-07 NOTE — Progress Notes (Signed)
Pre visit review using our clinic review tool, if applicable. No additional management support is needed unless otherwise documented below in the visit note. 

## 2014-05-07 NOTE — Progress Notes (Signed)
[facility]  Subjective:  Jlynn Langille is a 54 y.o. year old very pleasant male patient who presents with concern for Sinusitis vs. URI with syptoms including nasal congestion and pressure, cough with fits at times- greenish discoloration as well as discharge from nose.   80 45-69 years old was sick over Christmas. He started with symptoms at that time. He has Never missed days of work.   -started: >2 weeks ago initially improving and then worsened 3 days ago with most bothersome symptom being sinus pressure and congestion.  -previous treatments: nyquil/dayquil helps him sleep and then get through work day.   ROS-denies fever, chest pain,  SOB, NVD, tooth pain. Occasional chills.   Pertinent Pastt Medical History- hypothyroidism, hypertension  Medications- reviewed  Current Outpatient Prescriptions  Medication Sig Dispense Refill  . levothyroxine (SYNTHROID, LEVOTHROID) 112 MCG tablet Take 2 tablets (224 mcg total) by mouth daily before breakfast. 180 tablet 3  . losartan-hydrochlorothiazide (HYZAAR) 50-12.5 MG per tablet Take 1 tablet by mouth daily. 90 tablet 3   No current facility-administered medications for this visit.    Objective: BP 140/86 mmHg  Pulse 88  Temp(Src) 98.4 F (36.9 C) (Oral)  Resp 20  Ht 6\' 3"  (1.905 m)  Wt 266 lb 8 oz (120.884 kg)  BMI 33.31 kg/m2  SpO2 98% Gen: NAD, resting comfortably HEENT: Turbinates erythematous with green discharge, TM normal, pharynx mildly erythematous with no tonsilar exudate or edema, mild maxillary  sinus tenderness CV: RRR no murmurs rubs or gallops Lungs: CTAB no crackles, wheeze, rhonchi Ext: no edema Skin: warm, dry, no rash   Assessment/Plan:  Bacterial sinsuitis Sinus pressure most bothersome symptom with > 2 weeks of symptoms and worsening over last 3 days after improving suggestive of superinfection on possible viral URI/Sinusitis. Treat with z-pack. Finally, we reviewed reasons to  return to care including if symptoms worsen or persist or new concerns arise.  Advised PCP follow up for blood pressure recheck.

## 2014-07-18 ENCOUNTER — Other Ambulatory Visit: Payer: Self-pay

## 2014-07-18 MED ORDER — LEVOTHYROXINE SODIUM 112 MCG PO TABS: 224.0000 ug | ORAL_TABLET | Freq: Every day | ORAL | Status: DC

## 2014-07-18 MED ORDER — LOSARTAN POTASSIUM-HCTZ 50-12.5 MG PO TABS: 1.0000 | ORAL_TABLET | Freq: Every day | ORAL | Status: DC

## 2014-07-20 ENCOUNTER — Other Ambulatory Visit: Payer: Self-pay

## 2014-07-20 MED ORDER — LOSARTAN POTASSIUM-HCTZ 50-12.5 MG PO TABS: 1.0000 | ORAL_TABLET | Freq: Every day | ORAL | Status: DC

## 2014-07-20 MED ORDER — LEVOTHYROXINE SODIUM 112 MCG PO TABS: 224.0000 ug | ORAL_TABLET | Freq: Every day | ORAL | Status: DC

## 2014-09-30 ENCOUNTER — Ambulatory Visit: Payer: Commercial Managed Care - PPO | Admitting: Endocrinology

## 2014-10-06 ENCOUNTER — Telehealth: Payer: Self-pay | Admitting: *Deleted

## 2014-10-06 ENCOUNTER — Encounter: Payer: Commercial Managed Care - PPO | Admitting: *Deleted

## 2014-10-06 NOTE — Telephone Encounter (Signed)
Patient came in for his B/P check today, it was 139/89.

## 2014-10-06 NOTE — Telephone Encounter (Signed)
please call patient: Please continue the same medication

## 2014-10-06 NOTE — Telephone Encounter (Signed)
Called pt and advised him per Dr Cordelia Pen message below. Pt voiced understanding.

## 2014-12-29 ENCOUNTER — Other Ambulatory Visit: Payer: Self-pay | Admitting: Endocrinology

## 2014-12-30 NOTE — Telephone Encounter (Signed)
Please advise if ok to refill. Last office visit was 02/25/2014. Thanks! 

## 2015-03-04 ENCOUNTER — Other Ambulatory Visit: Payer: Self-pay | Admitting: Endocrinology

## 2015-03-06 NOTE — Telephone Encounter (Signed)
Please refill x 1 Ov is due  

## 2015-03-06 NOTE — Telephone Encounter (Signed)
Appointment letter mailed to the pt.  

## 2015-03-06 NOTE — Telephone Encounter (Signed)
Please advise if ok to refill. Last office visit was 02/25/2014. Thanks! 

## 2015-05-09 ENCOUNTER — Other Ambulatory Visit: Payer: Self-pay | Admitting: Endocrinology

## 2015-05-11 ENCOUNTER — Other Ambulatory Visit: Payer: Self-pay | Admitting: Endocrinology

## 2015-05-24 ENCOUNTER — Other Ambulatory Visit: Payer: Commercial Managed Care - PPO

## 2015-05-30 ENCOUNTER — Encounter: Payer: Commercial Managed Care - PPO | Admitting: Endocrinology

## 2015-05-31 ENCOUNTER — Other Ambulatory Visit: Payer: Self-pay

## 2015-05-31 ENCOUNTER — Other Ambulatory Visit (INDEPENDENT_AMBULATORY_CARE_PROVIDER_SITE_OTHER): Payer: Commercial Managed Care - HMO

## 2015-05-31 DIAGNOSIS — Z0000 Encounter for general adult medical examination without abnormal findings: Secondary | ICD-10-CM | POA: Diagnosis not present

## 2015-05-31 LAB — URINALYSIS, ROUTINE W REFLEX MICROSCOPIC
Bilirubin Urine: NEGATIVE
HGB URINE DIPSTICK: NEGATIVE
Ketones, ur: NEGATIVE
Leukocytes, UA: NEGATIVE
NITRITE: NEGATIVE
RBC / HPF: NONE SEEN (ref 0–?)
SPECIFIC GRAVITY, URINE: 1.025 (ref 1.000–1.030)
Total Protein, Urine: NEGATIVE
URINE GLUCOSE: NEGATIVE
Urobilinogen, UA: 0.2 (ref 0.0–1.0)
WBC UA: NONE SEEN (ref 0–?)
pH: 6 (ref 5.0–8.0)

## 2015-05-31 LAB — HEPATIC FUNCTION PANEL
ALK PHOS: 67 U/L (ref 39–117)
ALT: 16 U/L (ref 0–53)
AST: 15 U/L (ref 0–37)
Albumin: 4.4 g/dL (ref 3.5–5.2)
BILIRUBIN DIRECT: 0.1 mg/dL (ref 0.0–0.3)
BILIRUBIN TOTAL: 0.7 mg/dL (ref 0.2–1.2)
TOTAL PROTEIN: 7.1 g/dL (ref 6.0–8.3)

## 2015-05-31 LAB — BASIC METABOLIC PANEL
BUN: 13 mg/dL (ref 6–23)
CHLORIDE: 105 meq/L (ref 96–112)
CO2: 29 meq/L (ref 19–32)
Calcium: 9 mg/dL (ref 8.4–10.5)
Creatinine, Ser: 0.94 mg/dL (ref 0.40–1.50)
GFR: 89.48 mL/min (ref >60.00)
GLUCOSE: 91 mg/dL (ref 70–99)
POTASSIUM: 3.7 meq/L (ref 3.5–5.1)
SODIUM: 142 meq/L (ref 135–145)

## 2015-05-31 LAB — HEMOGLOBIN A1C: HEMOGLOBIN A1C: 4.9 % (ref 4.6–6.5)

## 2015-05-31 LAB — CBC WITH DIFFERENTIAL/PLATELET
Basophils Absolute: 0 10*3/uL (ref 0.0–0.1)
Basophils Relative: 0.6 % (ref 0.0–3.0)
EOS ABS: 0.3 10*3/uL (ref 0.0–0.7)
Eosinophils Relative: 4.3 % (ref 0.0–5.0)
HCT: 44.7 % (ref 39.0–52.0)
HEMOGLOBIN: 14.9 g/dL (ref 13.0–17.0)
Lymphocytes Relative: 28.2 % (ref 12.0–46.0)
Lymphs Abs: 1.9 10*3/uL (ref 0.7–4.0)
MCHC: 33.3 g/dL (ref 30.0–36.0)
MCV: 84.8 fl (ref 78.0–100.0)
MONO ABS: 0.4 10*3/uL (ref 0.1–1.0)
Monocytes Relative: 6.3 % (ref 3.0–12.0)
NEUTROS PCT: 60.6 % (ref 43.0–77.0)
Neutro Abs: 4.1 10*3/uL (ref 1.4–7.7)
Platelets: 244 10*3/uL (ref 150.0–400.0)
RBC: 5.26 Mil/uL (ref 4.22–5.81)
RDW: 14.7 % (ref 11.5–15.5)
WBC: 6.8 10*3/uL (ref 4.0–10.5)

## 2015-05-31 LAB — LIPID PANEL
CHOL/HDL RATIO: 3
Cholesterol: 158 mg/dL (ref 0–200)
HDL: 47.1 mg/dL (ref >39.00)
LDL CALC: 99 mg/dL (ref 0–99)
NONHDL: 111.38
TRIGLYCERIDES: 64 mg/dL (ref 0.0–149.0)
VLDL: 12.8 mg/dL (ref 0.0–40.0)

## 2015-05-31 LAB — TSH: TSH: 2.23 u[IU]/mL (ref 0.35–4.50)

## 2015-05-31 LAB — PSA: PSA: 0.61 ng/mL (ref 0.10–4.00)

## 2015-06-21 ENCOUNTER — Ambulatory Visit (INDEPENDENT_AMBULATORY_CARE_PROVIDER_SITE_OTHER): Payer: Commercial Managed Care - HMO | Admitting: Endocrinology

## 2015-06-21 ENCOUNTER — Other Ambulatory Visit: Payer: Self-pay

## 2015-06-21 ENCOUNTER — Encounter: Payer: Self-pay | Admitting: Endocrinology

## 2015-06-21 VITALS — BP 136/78 | HR 94 | Temp 99.4°F | Ht 75.0 in | Wt 274.0 lb

## 2015-06-21 DIAGNOSIS — Z0000 Encounter for general adult medical examination without abnormal findings: Secondary | ICD-10-CM

## 2015-06-21 DIAGNOSIS — M25562 Pain in left knee: Secondary | ICD-10-CM | POA: Diagnosis not present

## 2015-06-21 LAB — HEPATITIS C ANTIBODY: HCV Ab: NEGATIVE

## 2015-06-21 LAB — URIC ACID: Uric Acid, Serum: 7.1 mg/dL (ref 4.0–7.8)

## 2015-06-21 MED ORDER — LEVOTHYROXINE SODIUM 112 MCG PO TABS: ORAL_TABLET | ORAL | Status: DC

## 2015-06-21 NOTE — Patient Instructions (Addendum)
blood tests are requested for you today.  We'll let you know about the results.  please consider these measures for your health:  minimize alcohol.  do not use tobacco products.  have a colonoscopy at least every 10 years from age 55.  Women should have an annual mammogram from age 69.  keep firearms safely stored.  always use seat belts.  have working smoke alarms in your home.  see an eye doctor and dentist regularly.  never drive under the influence of alcohol or drugs (including prescription drugs).  those with fair skin should take precautions against the sun. Please return in 1 year.

## 2015-06-21 NOTE — Progress Notes (Signed)
Subjective:    Patient ID: Alexis Norris, male    DOB: [birth year], 55 y.o.   MRN: [id number]  HPI Pt is here for regular wellness examination, and is feeling pretty well in general, and says chronic med probs are stable, except as noted below.  He has slight pain at the right great toe.  Past Medical History  Diagnosis Date  . HYPERTENSION 01/13/2007  . HYPOTHYROIDISM, 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  . DYSLIPIDEMIA 01/13/2007    Past Surgical History  Procedure Laterality Date  . I-131 therapy  09/1997  . Right knuckle      pin placed in high school  . Inguinal hernia repair Right 09/28/2013    Procedure: LAPAROSCOPIC RIGHT  INGUINAL HERNIA;  Surgeon: Ralene Ok, MD;  Location: [place];  Service: General;  Laterality: Right;  . Insertion of mesh Right 09/28/2013    Procedure: INSERTION OF MESH;  Surgeon: Ralene Ok, MD;  Location: [place];  Service: General;  Laterality: Right;    Social History   Social History  . Marital Status: Married    Spouse Name: N/A  . Number of Children: N/A  . Years of Education: N/A   Occupational History  . Tour manager    Social History Main Topics  . Smoking status: Never Smoker   . Smokeless tobacco: Not on file  . Alcohol Use: No  . Drug Use: No  . Sexual Activity: Not on file   Other Topics Concern  . Not on file   Social History Narrative   Works Marine scientist    Current Outpatient Prescriptions on File Prior to Visit  Medication Sig Dispense Refill  . losartan-hydrochlorothiazide (HYZAAR) 50-12.5 MG tablet Take 1 tablet by mouth  daily 90 tablet 0   No current facility-administered medications on file prior to visit.    Allergies  Allergen Reactions  . Simvastatin     REACTION: didn't feel well    Family History  Problem Relation Age of Onset  . Thyroid cancer Sister   . Heart attack Father   . Cancer Mother     BP 136/78 mmHg  Pulse 94  Temp(Src) 99.4 F (37.4 C) (Oral)  Ht 6\' 3"  (1.905 m)  Wt 274 lb  (124.286 kg)  BMI 34.25 kg/m2  SpO2 97%  Review of Systems  Constitutional: Negative for fever.  HENT: Negative for hearing loss.   Eyes: Negative for visual disturbance.  Respiratory: Negative for shortness of breath.   Cardiovascular: Negative for chest pain.  Gastrointestinal: Negative for anal bleeding.  Endocrine: Negative for cold intolerance.  Genitourinary: Negative for hematuria.  Musculoskeletal: Negative for back pain.  Skin: Negative for rash.  Allergic/Immunologic: Negative for environmental allergies.  Neurological: Negative for syncope and headaches.  Hematological: Does not bruise/bleed easily.  Psychiatric/Behavioral: Negative for dysphoric mood.       Objective:   Physical Exam VS: see vs page GEN: no distress HEAD: head: no deformity eyes: no periorbital swelling, no proptosis external nose and ears are normal mouth: no lesion seen Ears; cerumen is present at the right eac (removed today) NECK: supple, thyroid is not enlarged CHEST WALL: no deformity LUNGS: clear to auscultation BREASTS:  No gynecomastia CV: reg rate and rhythm, no murmur ABD: abdomen is soft, nontender.  no hepatosplenomegaly.  not distended.  no hernia RECTAL/PROSTATE: declined MUSCULOSKELETAL: muscle bulk and strength are grossly normal.  no obvious joint swelling.  gait is normal and steady EXTEMITIES: no deformity.  no ulcer on the feet.  feet  are of normal color and temp.  1+ bilat leg edema PULSES: dorsalis pedis intact bilat.  no carotid bruit NEURO:  cn 2-12 grossly intact.   readily moves all 4's.  sensation is intact to touch on the feet SKIN:  Normal texture and temperature.  No rash or suspicious lesion is visible.   NODES:  None palpable at the neck PSYCH: alert, well-oriented.  Does not appear anxious nor depressed.  i personally reviewed electrocardiogram tracing (today): Indication: HTN Impression: normal to my reading     Assessment & Plan:  Wellness visit  today, with problems stable, except as noted.  Patient is advised the following: Patient Instructions  blood tests are requested for you today.  We'll let you know about the results.  please consider these measures for your health:  minimize alcohol.  do not use tobacco products.  have a colonoscopy at least every 10 years from age 22.  Women should have an annual mammogram from age 31.  keep firearms safely stored.  always use seat belts.  have working smoke alarms in your home.  see an eye doctor and dentist regularly.  never drive under the influence of alcohol or drugs (including prescription drugs).  those with fair skin should take precautions against the sun. Please return in 1 year.

## 2015-07-14 ENCOUNTER — Encounter: Payer: Self-pay | Admitting: Gastroenterology

## 2015-07-31 ENCOUNTER — Other Ambulatory Visit: Payer: Self-pay | Admitting: Endocrinology

## 2015-08-18 ENCOUNTER — Ambulatory Visit (AMBULATORY_SURGERY_CENTER): Payer: Self-pay | Admitting: *Deleted

## 2015-08-18 VITALS — Ht 75.0 in | Wt 278.0 lb

## 2015-08-18 DIAGNOSIS — Z1211 Encounter for screening for malignant neoplasm of colon: Secondary | ICD-10-CM

## 2015-08-18 NOTE — Progress Notes (Signed)
No egg or soy allergy known to patient  No issues with past sedation with any surgeries  or procedures, no intubation problems  No diet pills per patient No home 02 use per patient  No blood thinners per patient  Pt denies issues with constipation emmi declined

## 2015-08-31 ENCOUNTER — Ambulatory Visit (AMBULATORY_SURGERY_CENTER): Payer: Commercial Managed Care - HMO | Admitting: Gastroenterology

## 2015-08-31 ENCOUNTER — Encounter: Payer: Self-pay | Admitting: Gastroenterology

## 2015-08-31 VITALS — BP 138/80 | HR 69 | Temp 99.1°F | Resp 18 | Ht 75.0 in | Wt 278.0 lb

## 2015-08-31 DIAGNOSIS — Z1211 Encounter for screening for malignant neoplasm of colon: Secondary | ICD-10-CM | POA: Diagnosis not present

## 2015-08-31 MED ORDER — SODIUM CHLORIDE 0.9 % IV SOLN: 500.0000 mL | INTRAVENOUS | Status: DC

## 2015-08-31 NOTE — Op Note (Signed)
[facility] Patient Name: Alexis Norris Procedure Date: 08/31/2015 10:26 AM MRN: [id number] Endoscopist: Mallie Mussel L. Loletha Carrow , MD Age: 55 Date of Birth: 1960-11-27 Gender: Male Procedure:                Colonoscopy Indications:              Screening for colorectal malignant neoplasm, This                            is the patient's first colonoscopy Medicines:                Monitored Anesthesia Care Procedure:                Pre-Anesthesia Assessment:                           - Prior to the procedure, a History and Physical                            was performed, and patient medications and                            allergies were reviewed. The patient's tolerance of                            previous anesthesia was also reviewed. The risks                            and benefits of the procedure and the sedation                            options and risks were discussed with the patient.                            All questions were answered, and informed consent                            was obtained. Prior Anticoagulants: The patient has                            taken no previous anticoagulant or antiplatelet                            agents. ASA Grade Assessment: II - A patient with                            mild systemic disease. After reviewing the risks                            and benefits, the patient was deemed in                            satisfactory condition to undergo the procedure.  After obtaining informed consent, the colonoscope                            was passed under direct vision. Throughout the                            procedure, the patient's blood pressure, pulse, and                            oxygen saturations were monitored continuously. The                            Model CF-HQ190L [id number]) scope was introduced                            through the anus and advanced to the the cecum,                 identified by appendiceal orifice and ileocecal                            valve. The colonoscopy was performed without                            difficulty. The patient tolerated the procedure                            well. The quality of the bowel preparation was                            excellent. The ileocecal valve, appendiceal                            orifice, and rectum were photographed. The bowel                            preparation used was Miralax. Scope In: 10:35:01 AM Scope Out: 10:47:01 AM Scope Withdrawal Time: 0 hours 10 minutes 33 seconds  Total Procedure Duration: 0 hours 12 minutes 0 seconds  Findings:                 The perianal and digital rectal examinations were                            normal.                           Multiple small-mouthed diverticula were found in                            the ascending colon and left colon.                           The exam was otherwise without abnormality on  direct and retroflexion views. Complications:            No immediate complications. Estimated Blood Loss:     Estimated blood loss: none. Impression:               - Diverticulosis in the ascending colon and in the                            left colon.                           - The examination was otherwise normal on direct                            and retroflexion views.                           - No specimens collected. Recommendation:           - Patient has a contact number available for                            emergencies. The signs and symptoms of potential                            delayed complications were discussed with the                            patient. Return to normal activities tomorrow.                            Written discharge instructions were provided to the                            patient.                           - Resume previous diet.                           - Continue  present medications.                           - Repeat colonoscopy in 10 years for screening                            purposes. Henry L. Loletha Carrow, MD 08/31/2015 10:50:46 AM This report has been signed electronically.

## 2015-08-31 NOTE — Progress Notes (Signed)
Alert and  Oriented x 3  VSS  Report to [employer]

## 2015-08-31 NOTE — Patient Instructions (Signed)
YOU HAD AN ENDOSCOPIC PROCEDURE TODAY AT [facility]:   Refer to the procedure report that was given to you for any specific questions about what was found during the examination.  If the procedure report does not answer your questions, please call your gastroenterologist to clarify.  If you requested that your care partner not be given the details of your procedure findings, then the procedure report has been included in a sealed envelope for you to review at your convenience later.  YOU SHOULD EXPECT: Some feelings of bloating in the abdomen. Passage of more gas than usual.  Walking can help get rid of the air that was put into your GI tract during the procedure and reduce the bloating. If you had a lower endoscopy (such as a colonoscopy or flexible sigmoidoscopy) you may notice spotting of blood in your stool or on the toilet paper. If you underwent a bowel prep for your procedure, you may not have a normal bowel movement for a few days.  Please Note:  You might notice some irritation and congestion in your nose or some drainage.  This is from the oxygen used during your procedure.  There is no need for concern and it should clear up in a day or so.  SYMPTOMS TO REPORT IMMEDIATELY:   Following lower endoscopy (colonoscopy or flexible sigmoidoscopy):  Excessive amounts of blood in the stool  Significant tenderness or worsening of abdominal pains  Swelling of the abdomen that is new, acute  Fever of 100F or higher  For urgent or emergent issues, a gastroenterologist can be reached at any hour by calling [phone].   DIET: Your first meal following the procedure should be a small meal and then it is ok to progress to your normal diet. Heavy or fried foods are harder to digest and may make you feel nauseous or bloated.  Likewise, meals heavy in dairy and vegetables can increase bloating.  Drink plenty of fluids but you should avoid alcoholic beverages for 24  hours.  ACTIVITY:  You should plan to take it easy for the rest of today and you should NOT DRIVE or use heavy machinery until tomorrow (because of the sedation medicines used during the test).    FOLLOW UP: Our staff will call the number listed on your records the next business day following your procedure to check on you and address any questions or concerns that you may have regarding the information given to you following your procedure. If we do not reach you, we will leave a message.  However, if you are feeling well and you are not experiencing any problems, there is no need to return our call.  We will assume that you have returned to your regular daily activities without incident.  If any biopsies were taken you will be contacted by phone or by letter within the next 1-3 weeks.  Please call us at [phone] if you have not heard about the biopsies in 3 weeks.    SIGNATURES/CONFIDENTIALITY: You and/or your care partner have signed paperwork which will be entered into your electronic medical record.  These signatures attest to the fact that that the information above on your After Visit Summary has been reviewed and is understood.  Full responsibility of the confidentiality of this discharge information lies with you and/or your care-partner.  Diverticulosis information given.  Recall 10 years-2027

## 2015-09-01 ENCOUNTER — Telehealth: Payer: Self-pay

## 2015-09-01 NOTE — Telephone Encounter (Signed)
  Follow up Call-  Call back number 08/31/2015  Post procedure Call Back phone  # [phone]  Permission to leave phone message Yes     Patient questions:  Do you have a fever, pain , or abdominal swelling? No. Pain Score  0 *  Have you tolerated food without any problems? Yes.    Have you been able to return to your normal activities? Yes.    Do you have any questions about your discharge instructions: Diet   No. Medications  No. Follow up visit  No.  Do you have questions or concerns about your Care? No.  Actions: * If pain score is 4 or above: No action needed, pain <4.

## 2015-10-21 ENCOUNTER — Other Ambulatory Visit: Payer: Self-pay | Admitting: Endocrinology

## 2016-02-02 ENCOUNTER — Telehealth: Payer: Self-pay | Admitting: Endocrinology

## 2016-02-02 NOTE — Telephone Encounter (Signed)
Options: [facility] today or tomorrow. Try elevating the foot, and taking advil

## 2016-02-02 NOTE — Telephone Encounter (Signed)
Pt believes he is having a flair up of gout in his big toe, he would like to be seen today but there is no space, if he cannot be worked in what can he do to help with the pain

## 2016-02-02 NOTE — Telephone Encounter (Signed)
I contacted the patient and advised of message. Patient voiced understanding and stated he would call back next week if his pain is not better.

## 2016-02-06 ENCOUNTER — Encounter: Payer: Self-pay | Admitting: Endocrinology

## 2016-02-06 ENCOUNTER — Other Ambulatory Visit: Payer: Self-pay | Admitting: Endocrinology

## 2016-02-06 ENCOUNTER — Ambulatory Visit (INDEPENDENT_AMBULATORY_CARE_PROVIDER_SITE_OTHER): Payer: Commercial Managed Care - HMO | Admitting: Endocrinology

## 2016-02-06 DIAGNOSIS — M10272 Drug-induced gout, left ankle and foot: Secondary | ICD-10-CM

## 2016-02-06 DIAGNOSIS — M109 Gout, unspecified: Secondary | ICD-10-CM | POA: Insufficient documentation

## 2016-02-06 MED ORDER — LOSARTAN POTASSIUM-HCTZ 100-12.5 MG PO TABS: 0.5000 | ORAL_TABLET | Freq: Every day | ORAL | 3 refills | Status: DC

## 2016-02-06 MED ORDER — TRIAMCINOLONE ACETONIDE 0.1 % EX CREA: 1.0000 "application " | TOPICAL_CREAM | Freq: Three times a day (TID) | CUTANEOUS | 2 refills | Status: DC

## 2016-02-06 NOTE — Progress Notes (Signed)
Subjective:    Patient ID: Alexis Norris, male    DOB: 07-May-1960, 55 y.o.   MRN: [id number]  HPI Pt states few days of moderate pain at the left great toe last week.  It is improved with elevation.   Past Medical History:  Diagnosis Date  . DYSLIPIDEMIA 01/13/2007   no medicines   . HYPERTENSION 01/13/2007  . 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 I-131 therapy  09/1997   maybe 2000-2001  . INGUINAL HERNIA REPAIR Right 09/28/2013   Procedure: LAPAROSCOPIC RIGHT  INGUINAL HERNIA;  Surgeon: Ralene Ok, MD;  Location: [place];  Service: General;  Laterality: Right;  . INSERTION OF MESH Right 09/28/2013   Procedure: INSERTION OF MESH;  Surgeon: Ralene Ok, MD;  Location: [place];  Service: General;  Laterality: Right;  . right knuckle     pin placed in high school    Social History   Social History  . Marital status: Married    Spouse name: N/A  . Number of children: N/A  . Years of education: N/A   Occupational History  . Tour manager    Social History Main Topics  . Smoking status: Never Smoker  . Smokeless tobacco: Never Used  . Alcohol use 0.0 oz/week     Comment: infrequently  . Drug use: No  . Sexual activity: Not on file   Other Topics Concern  . Not on file   Social History Narrative   Works Marine scientist    Current Outpatient Prescriptions on File Prior to Visit  Medication Sig Dispense Refill  . levothyroxine (SYNTHROID, LEVOTHROID) 112 MCG tablet Take 2 tablets by mouth  daily before breakfast 180 tablet 4   No current facility-administered medications on file prior to visit.     Allergies  Allergen Reactions  . Simvastatin Nausea Only    REACTION: didn't feel well    Family History  Problem Relation Age of Onset  . Thyroid cancer Sister   . Heart attack Father   . Cancer Mother   . Colon cancer Neg Hx   . Colon polyps Neg Hx     BP 132/86   Pulse 98   Ht 6\' 3"  (1.905 m)    Wt 286 lb (129.7 kg)   SpO2 98%   BMI 35.75 kg/m    Review of Systems He has an itchy rash on the right leg.  He has weight gain.      Objective:   Physical Exam VITAL SIGNS:  See vs page GENERAL: no distress Pulses: dorsalis pedis intact bilat.   MSK: no deformity of the feet.   CV: trace bilat leg edema, and bilat vv's.  Skin:  no ulcer on the feet.  There is a 5 cm diameter area of skin thickening at the right leg.  normal color and temp on the feet.   Neuro: sensation is intact to touch on the feet.    Lab Results  Component Value Date   LABURIC 7.1 06/21/2015       Assessment & Plan:  HTN: well-controlled Gout: exac by HCTZ Rash, new, uncertain etiology Patient is advised the following: Patient Instructions  I have sent prescriptions to your pharmacy, to change the blood pressure medication, and for skin cream.  Please come back for a regular physical appointment in 6 months (must be after 06/20/16).           Low-Purine Diet Purines are compounds that  affect the level of uric acid in your body. A low-purine diet is a diet that is low in purines. Eating a low-purine diet can prevent the level of uric acid in your body from getting too high and causing gout or kidney stones or both. WHAT DO I NEED TO KNOW ABOUT THIS DIET?  Choose low-purine foods. Examples of low-purine foods are listed in the next section.  Drink plenty of fluids, especially water. Fluids can help remove uric acid from your body. Try to drink 8-16 cups (1.9-3.8 L) a day.  Limit foods high in fat, especially saturated fat, as fat makes it harder for the body to get rid of uric acid. Foods high in saturated fat include pizza, cheese, ice cream, whole milk, fried foods, and gravies. Choose foods that are lower in fat and lean sources of protein. Use olive oil when cooking as it contains healthy fats that are not high in saturated fat.  Limit alcohol. Alcohol interferes with the elimination of  uric acid from your body. If you are having a gout attack, avoid all alcohol.  Keep in mind that different people's bodies react differently to different foods. You will probably learn over time which foods do or do not affect you. If you discover that a food tends to cause your gout to flare up, avoid eating that food. You can more freely enjoy foods that do not cause problems. If you have any questions about a food item, talk to your dietitian or health care provider. WHICH FOODS ARE LOW, MODERATE, AND HIGH IN PURINES? The following is a list of foods that are low, moderate, and high in purines. You can eat any amount of the foods that are low in purines. You may be able to have small amounts of foods that are moderate in purines. Ask your health care provider how much of a food moderate in purines you can have. Avoid foods high in purines. Grains  Foods low in purines: Enriched white bread, pasta, rice, cake, cornbread, popcorn.  Foods moderate in purines: Whole-grain breads and cereals, wheat germ, bran, oatmeal. Uncooked oatmeal. Dry wheat bran or wheat germ.  Foods high in purines: Pancakes, [place] toast, biscuits, muffins. Vegetables  Foods low in purines: All vegetables, except those that are moderate in purines.  Foods moderate in purines: Asparagus, cauliflower, spinach, mushrooms, green peas. Fruits  All fruits are low in purines. Meats and other Protein Foods  Foods low in purines: Eggs, nuts, peanut butter.  Foods moderate in purines: 80-90% lean beef, lamb, veal, pork, poultry, fish, eggs, peanut butter, nuts. Crab, lobster, oysters, and shrimp. Cooked dried beans, peas, and lentils.  Foods high in purines: Anchovies, sardines, herring, mussels, tuna, codfish, scallops, trout, and haddock. Berniece Salines. Organ meats (such as liver or kidney). Tripe. Game meat. Goose. Sweetbreads. Dairy  All dairy foods are low in purines. Low-fat and fat-free dairy products are best because they  are low in saturated fat. Beverages  Drinks low in purines: Water, carbonated beverages, tea, coffee, cocoa.  Drinks moderate in purines: Soft drinks and other drinks sweetened with high-fructose corn syrup. Juices. To find whether a food or drink is sweetened with high-fructose corn syrup, look at the ingredients list.  Drinks high in purines: Alcoholic beverages (such as beer). Condiments  Foods low in purines: Salt, herbs, olives, pickles, relishes, vinegar.  Foods moderate in purines: Butter, margarine, oils, mayonnaise. Fats and Oils  Foods low in purines: All types, except gravies and sauces made with meat.  Foods high in purines: Gravies and sauces made with meat. Other Foods  Foods low in purines: Sugars, sweets, gelatin. Cake. Soups made without meat.  Foods moderate in purines: Meat-based or fish-based soups, broths, or bouillons. Foods and drinks sweetened with high-fructose corn syrup.  Foods high in purines: High-fat desserts (such as ice cream, cookies, cakes, pies, doughnuts, and chocolate). Contact your dietitian for more information on foods that are not listed here.   This information is not intended to replace advice given to you by your health care provider. Make sure you discuss any questions you have with your health care provider.   Document Released: 08/10/2010 Document Revised: 04/20/2013 Document Reviewed: 03/22/2013 Elsevier Interactive Patient Education [employer].

## 2016-02-06 NOTE — Patient Instructions (Addendum)
I have sent prescriptions to your pharmacy, to change the blood pressure medication, and for skin cream.  Please come back for a regular physical appointment in 6 months (must be after 06/20/16).           Low-Purine Diet Purines are compounds that affect the level of uric acid in your body. A low-purine diet is a diet that is low in purines. Eating a low-purine diet can prevent the level of uric acid in your body from getting too high and causing gout or kidney stones or both. WHAT DO I NEED TO KNOW ABOUT THIS DIET?  Choose low-purine foods. Examples of low-purine foods are listed in the next section.  Drink plenty of fluids, especially water. Fluids can help remove uric acid from your body. Try to drink 8-16 cups (1.9-3.8 L) a day.  Limit foods high in fat, especially saturated fat, as fat makes it harder for the body to get rid of uric acid. Foods high in saturated fat include pizza, cheese, ice cream, whole milk, fried foods, and gravies. Choose foods that are lower in fat and lean sources of protein. Use olive oil when cooking as it contains healthy fats that are not high in saturated fat.  Limit alcohol. Alcohol interferes with the elimination of uric acid from your body. If you are having a gout attack, avoid all alcohol.  Keep in mind that different people's bodies react differently to different foods. You will probably learn over time which foods do or do not affect you. If you discover that a food tends to cause your gout to flare up, avoid eating that food. You can more freely enjoy foods that do not cause problems. If you have any questions about a food item, talk to your dietitian or health care provider. WHICH FOODS ARE LOW, MODERATE, AND HIGH IN PURINES? The following is a list of foods that are low, moderate, and high in purines. You can eat any amount of the foods that are low in purines. You may be able to have small amounts of foods that are moderate in purines. Ask your  health care provider how much of a food moderate in purines you can have. Avoid foods high in purines. Grains  Foods low in purines: Enriched white bread, pasta, rice, cake, cornbread, popcorn.  Foods moderate in purines: Whole-grain breads and cereals, wheat germ, bran, oatmeal. Uncooked oatmeal. Dry wheat bran or wheat germ.  Foods high in purines: Pancakes, [place] toast, biscuits, muffins. Vegetables  Foods low in purines: All vegetables, except those that are moderate in purines.  Foods moderate in purines: Asparagus, cauliflower, spinach, mushrooms, green peas. Fruits  All fruits are low in purines. Meats and other Protein Foods  Foods low in purines: Eggs, nuts, peanut butter.  Foods moderate in purines: 80-90% lean beef, lamb, veal, pork, poultry, fish, eggs, peanut butter, nuts. Crab, lobster, oysters, and shrimp. Cooked dried beans, peas, and lentils.  Foods high in purines: Anchovies, sardines, herring, mussels, tuna, codfish, scallops, trout, and haddock. Berniece Salines. Organ meats (such as liver or kidney). Tripe. Game meat. Goose. Sweetbreads. Dairy  All dairy foods are low in purines. Low-fat and fat-free dairy products are best because they are low in saturated fat. Beverages  Drinks low in purines: Water, carbonated beverages, tea, coffee, cocoa.  Drinks moderate in purines: Soft drinks and other drinks sweetened with high-fructose corn syrup. Juices. To find whether a food or drink is sweetened with high-fructose corn syrup, look at the ingredients list.  Drinks high in purines: Alcoholic beverages (such as beer). Condiments  Foods low in purines: Salt, herbs, olives, pickles, relishes, vinegar.  Foods moderate in purines: Butter, margarine, oils, mayonnaise. Fats and Oils  Foods low in purines: All types, except gravies and sauces made with meat.  Foods high in purines: Gravies and sauces made with meat. Other Foods  Foods low in purines: Sugars, sweets,  gelatin. Cake. Soups made without meat.  Foods moderate in purines: Meat-based or fish-based soups, broths, or bouillons. Foods and drinks sweetened with high-fructose corn syrup.  Foods high in purines: High-fat desserts (such as ice cream, cookies, cakes, pies, doughnuts, and chocolate). Contact your dietitian for more information on foods that are not listed here.   This information is not intended to replace advice given to you by your health care provider. Make sure you discuss any questions you have with your health care provider.   Document Released: 08/10/2010 Document Revised: 04/20/2013 Document Reviewed: 03/22/2013 Elsevier Interactive Patient Education [employer].

## 2016-06-21 ENCOUNTER — Ambulatory Visit: Payer: Commercial Managed Care - HMO | Admitting: Endocrinology

## 2016-09-06 ENCOUNTER — Other Ambulatory Visit: Payer: Self-pay | Admitting: Endocrinology

## 2016-09-07 NOTE — Telephone Encounter (Signed)
Please refill x 3 mos Ov is due 

## 2016-11-28 ENCOUNTER — Other Ambulatory Visit: Payer: Self-pay | Admitting: Endocrinology

## 2016-11-29 ENCOUNTER — Other Ambulatory Visit: Payer: Self-pay

## 2016-11-29 MED ORDER — LEVOTHYROXINE SODIUM 112 MCG PO TABS: ORAL_TABLET | ORAL | 0 refills | Status: DC

## 2017-01-06 ENCOUNTER — Other Ambulatory Visit: Payer: Self-pay | Admitting: Endocrinology

## 2017-01-29 ENCOUNTER — Ambulatory Visit (INDEPENDENT_AMBULATORY_CARE_PROVIDER_SITE_OTHER): Payer: 59 | Admitting: Endocrinology

## 2017-01-29 ENCOUNTER — Encounter: Payer: Self-pay | Admitting: Endocrinology

## 2017-01-29 ENCOUNTER — Telehealth: Payer: Self-pay | Admitting: *Deleted

## 2017-01-29 ENCOUNTER — Ambulatory Visit
Admission: RE | Admit: 2017-01-29 | Discharge: 2017-01-29 | Disposition: A | Discharge status: Home / Self Care | Payer: Self-pay | Source: Ambulatory Visit | Attending: Endocrinology | Admitting: Endocrinology

## 2017-01-29 VITALS — BP 148/92 | HR 130 | Wt 275.0 lb

## 2017-01-29 DIAGNOSIS — Z125 Encounter for screening for malignant neoplasm of prostate: Secondary | ICD-10-CM | POA: Diagnosis not present

## 2017-01-29 DIAGNOSIS — R059 Cough, unspecified: Secondary | ICD-10-CM

## 2017-01-29 DIAGNOSIS — Z0000 Encounter for general adult medical examination without abnormal findings: Secondary | ICD-10-CM | POA: Diagnosis not present

## 2017-01-29 DIAGNOSIS — R05 Cough: Secondary | ICD-10-CM | POA: Diagnosis not present

## 2017-01-29 DIAGNOSIS — M10272 Drug-induced gout, left ankle and foot: Secondary | ICD-10-CM | POA: Diagnosis not present

## 2017-01-29 LAB — BASIC METABOLIC PANEL
BUN: 17 mg/dL (ref 6–23)
CALCIUM: 8.9 mg/dL (ref 8.4–10.5)
CO2: 24 meq/L (ref 19–32)
Chloride: 102 mEq/L (ref 96–112)
Creatinine, Ser: 1.15 mg/dL (ref 0.40–1.50)
GFR: 70.45 mL/min (ref >60.00)
GLUCOSE: 110 mg/dL — AB (ref 70–99)
POTASSIUM: 3.8 meq/L (ref 3.5–5.1)
SODIUM: 137 meq/L (ref 135–145)

## 2017-01-29 LAB — CBC WITH DIFFERENTIAL/PLATELET
BASOS PCT: 0.2 % (ref 0.0–3.0)
Basophils Absolute: 0 10*3/uL (ref 0.0–0.1)
EOS ABS: 0 10*3/uL (ref 0.0–0.7)
EOS PCT: 0 % (ref 0.0–5.0)
HCT: 42.1 % (ref 39.0–52.0)
HEMOGLOBIN: 14.2 g/dL (ref 13.0–17.0)
Lymphocytes Relative: 13.3 % (ref 12.0–46.0)
Lymphs Abs: 0.9 10*3/uL (ref 0.7–4.0)
MCHC: 33.7 g/dL (ref 30.0–36.0)
MCV: 83.5 fl (ref 78.0–100.0)
MONO ABS: 0.6 10*3/uL (ref 0.1–1.0)
Monocytes Relative: 8.7 % (ref 3.0–12.0)
NEUTROS ABS: 5.4 10*3/uL (ref 1.4–7.7)
Neutrophils Relative %: 77.8 % — ABNORMAL HIGH (ref 43.0–77.0)
PLATELETS: 209 10*3/uL (ref 150.0–400.0)
RBC: 5.04 Mil/uL (ref 4.22–5.81)
RDW: 14.8 % (ref 11.5–15.5)
WBC: 6.9 10*3/uL (ref 4.0–10.5)

## 2017-01-29 LAB — LIPID PANEL
CHOLESTEROL: 98 mg/dL (ref 0–200)
HDL: 34.6 mg/dL — AB (ref >39.00)
LDL CALC: 53 mg/dL (ref 0–99)
NonHDL: 63.61
Total CHOL/HDL Ratio: 3
Triglycerides: 54 mg/dL (ref 0.0–149.0)
VLDL: 10.8 mg/dL (ref 0.0–40.0)

## 2017-01-29 LAB — URINALYSIS, ROUTINE W REFLEX MICROSCOPIC
HGB URINE DIPSTICK: NEGATIVE
Ketones, ur: NEGATIVE
Leukocytes, UA: NEGATIVE
Nitrite: NEGATIVE
RBC / HPF: NONE SEEN (ref 0–?)
TOTAL PROTEIN, URINE-UPE24: 30 — AB
URINE GLUCOSE: NEGATIVE
UROBILINOGEN UA: 0.2 (ref 0.0–1.0)
pH: 6 (ref 5.0–8.0)

## 2017-01-29 LAB — HEPATIC FUNCTION PANEL
ALK PHOS: 52 U/L (ref 39–117)
ALT: 13 U/L (ref 0–53)
AST: 12 U/L (ref 0–37)
Albumin: 4.2 g/dL (ref 3.5–5.2)
BILIRUBIN DIRECT: 0.1 mg/dL (ref 0.0–0.3)
BILIRUBIN TOTAL: 0.5 mg/dL (ref 0.2–1.2)
Total Protein: 7.2 g/dL (ref 6.0–8.3)

## 2017-01-29 LAB — URIC ACID: Uric Acid, Serum: 5.1 mg/dL (ref 4.0–7.8)

## 2017-01-29 LAB — TSH: TSH: 10.94 u[IU]/mL — ABNORMAL HIGH (ref 0.35–4.50)

## 2017-01-29 LAB — PSA: PSA: 0.67 ng/mL (ref 0.10–4.00)

## 2017-01-29 MED ORDER — COLCHICINE 0.6 MG PO TABS: 0.6000 mg | ORAL_TABLET | Freq: Every day | ORAL | 3 refills | Status: DC

## 2017-01-29 MED ORDER — AZITHROMYCIN 500 MG PO TABS: 500.0000 mg | ORAL_TABLET | Freq: Every day | ORAL | 0 refills | Status: DC

## 2017-01-29 MED ORDER — COLCHICINE 0.6 MG PO TABS: 0.6000 mg | ORAL_TABLET | ORAL | 3 refills | Status: DC | PRN

## 2017-01-29 NOTE — Telephone Encounter (Signed)
All done

## 2017-01-29 NOTE — Telephone Encounter (Signed)
2:30 today

## 2017-01-29 NOTE — Telephone Encounter (Signed)
Called patient & he stated her could make 2:30 appt. Per Dr. Loanne Drilling.

## 2017-01-29 NOTE — Telephone Encounter (Signed)
Patient called and states he was experiencing flu like symptoms. Patient is experiencing weakness, body aches, lightheadness, sore throat, cough and fever. Dr. Loanne Drilling does not have any acute visit this week.  His first available is next Friday for an appt. Please advise where to schedule. Patient can be reached on his home phone [phone] to schedule an appt . Thank you

## 2017-01-29 NOTE — Patient Instructions (Addendum)
blood tests, and a chest x-ray, are requested for you today.  We'll let you know about the results.   I hope you feel better soon.  If you don't feel better by next week, please call back.  Please call sooner if you get worse.  Drink plenty of fluids.  I have sent a prescription to your pharmacy, for the antibiotic pill.

## 2017-01-29 NOTE — Progress Notes (Signed)
Subjective:    Patient ID: Alexis Norris, male    DOB: 1961-02-26, 56 y.o.   MRN: [id number]  HPI Pt states 1 week of moderate slightly prod-quality cough in the chest, and assoc severe fatigue.  He has slight sore throat, but no earache.  He has intermitt fever and chills.   Past Medical History:  Diagnosis Date  . DYSLIPIDEMIA 01/13/2007   no medicines   . HYPERTENSION 01/13/2007  . 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 I-131 therapy  09/1997   maybe 2000-2001  . INGUINAL HERNIA REPAIR Right 09/28/2013   Procedure: LAPAROSCOPIC RIGHT  INGUINAL HERNIA;  Surgeon: Ralene Ok, MD;  Location: [facility];  Service: General;  Laterality: Right;  . INSERTION OF MESH Right 09/28/2013   Procedure: INSERTION OF MESH;  Surgeon: Ralene Ok, MD;  Location: [facility];  Service: General;  Laterality: Right;  . right knuckle     pin placed in high school    Social History   Social History  . Marital status: Married    Spouse name: N/A  . Number of children: N/A  . Years of education: N/A   Occupational History  . Tour manager    Social History Main Topics  . Smoking status: Never Smoker  . Smokeless tobacco: Never Used  . Alcohol use 0.0 oz/week     Comment: infrequently  . Drug use: No  . Sexual activity: Not on file   Other Topics Concern  . Not on file   Social History Narrative   Works Marine scientist    Current Outpatient Prescriptions on File Prior to Visit  Medication Sig Dispense Refill  . levothyroxine (SYNTHROID, LEVOTHROID) 112 MCG tablet TAKE 2 TABLETS BY MOUTH  DAILY BEFORE BREAKFAST 180 tablet 0  . losartan-hydrochlorothiazide (HYZAAR) 100-12.5 MG tablet TAKE ONE-HALF TABLET BY  MOUTH DAILY 45 tablet 3  . triamcinolone cream (KENALOG) 0.1 % Apply 1 application topically 3 (three) times daily. As needed for rash 30 g 2   No current facility-administered medications on file prior to visit.     Allergies   Allergen Reactions  . Simvastatin Nausea Only    REACTION: didn't feel well    Family History  Problem Relation Age of Onset  . Thyroid cancer Sister   . Heart attack Father   . Cancer Mother   . Colon cancer Neg Hx   . Colon polyps Neg Hx     BP (!) 148/92   Pulse (!) 130   Wt 275 lb (124.7 kg)   SpO2 96%   BMI 34.37 kg/m     Review of Systems He has myalgias and generalized weakness.  Denies n/v/sob.  Only mild diarrhea. He has pain at both great toe MTP's.       Objective:   Physical Exam VITAL SIGNS:  See vs page GENERAL: no distress head: no deformity  eyes: no periorbital swelling, no proptosis  external nose and ears are normal  mouth: no lesion seen.  Both eac's and tm's are normal.  LUNGS:  Clear to auscultation, except for rales at the left base.   Ext: at the great toe MTP's, there is slight swelling and erythema.   CXR: LLL infltrate    Assessment & Plan:  HTN: situational.  Pheumonia, new.  Toe pain, possibly due to gout:  I rx'ed colchicine.  However, I told pt he should not take until azithromycin rx is done (therefore, he wanted  me to send to mail-order pharmacy).    Patient Instructions  blood tests, and a chest x-ray, are requested for you today.  We'll let you know about the results.   I hope you feel better soon.  If you don't feel better by next week, please call back.  Please call sooner if you get worse.  Drink plenty of fluids.  I have sent a prescription to your pharmacy, for the antibiotic pill.

## 2017-01-30 ENCOUNTER — Telehealth: Payer: Self-pay | Admitting: Endocrinology

## 2017-01-30 MED ORDER — PROMETHAZINE-CODEINE 6.25-10 MG/5ML PO SYRP: 5.0000 mL | ORAL_SOLUTION | ORAL | 0 refills | Status: DC | PRN

## 2017-01-30 MED ORDER — FLUTICASONE-SALMETEROL 100-50 MCG/DOSE IN AEPB: 1.0000 | INHALATION_SPRAY | Freq: Two times a day (BID) | RESPIRATORY_TRACT | 3 refills | Status: DC

## 2017-01-30 NOTE — Telephone Encounter (Signed)
I have sent a prescription to your pharmacy, for the inhaler, and I printed for the syrup.

## 2017-01-30 NOTE — Telephone Encounter (Signed)
Patient called in reference to being diagnosed with pneumonia and being prescribed z pack. Patient would like to know if he still needs to take this or anything else along with it to help. Please call patient and advise.

## 2017-01-30 NOTE — Telephone Encounter (Signed)
Called patient he would like an inhaler & cough syrup if possible since he has been wheezing.

## 2017-01-30 NOTE — Telephone Encounter (Signed)
Yes, I chose this antibiotic, assuming you had pneumonia.  This is the same as is often given in the hospital.  However, if you have wheezing, I soul also prescribe for you an inhaler, or cough syrup if want that--just let us know.

## 2017-01-30 NOTE — Telephone Encounter (Signed)
Please review

## 2017-01-31 ENCOUNTER — Telehealth: Payer: Self-pay | Admitting: Endocrinology

## 2017-01-31 NOTE — Telephone Encounter (Signed)
Please take medications as prescribed.  The 2 new medications will help pt feel better.  The prednisone is not good treatment for pneumonia.  If pt is not feeling better, he should go to urgent care any day, or [facility] tomorrow.

## 2017-01-31 NOTE — Telephone Encounter (Signed)
Called patients wife & she is coming to pick up prescription, but was upset he wasn't given something stronger to help patient at visit. I advised her if her husband wasn't better to try walk-in clinic.

## 2017-01-31 NOTE — Telephone Encounter (Signed)
Patient's wife would like a call from nurse to discuss the patient's health. Patient's wife states that the patient is not doing well on the antibiotic and she would like him to possibly be on prednisone. Patient's wife also needs to know about the inhaler and taking mucinex and cough medicine she is picking up today. Call [phone] to advise as soon as possible, okay to leave a detailed message.

## 2017-01-31 NOTE — Telephone Encounter (Signed)
Patient notified

## 2017-02-14 ENCOUNTER — Telehealth: Payer: Self-pay | Admitting: Endocrinology

## 2017-02-14 NOTE — Telephone Encounter (Signed)
Called patient & left VM that PA had been started. When PA is signed by Dr. Loanne Drilling I will fax it in.

## 2017-02-14 NOTE — Telephone Encounter (Signed)
Pt's colchicine is not being covered by insurance without a PA

## 2017-02-14 NOTE — Telephone Encounter (Signed)
No alternative.  i'll do PA

## 2017-02-17 ENCOUNTER — Other Ambulatory Visit: Payer: Self-pay | Admitting: Endocrinology

## 2017-05-12 ENCOUNTER — Telehealth: Payer: Self-pay | Admitting: Endocrinology

## 2017-05-12 NOTE — Telephone Encounter (Signed)
There is no other med for immediate relief, but we can prescribe one (cheaper) to keep the gout away.

## 2017-05-12 NOTE — Telephone Encounter (Signed)
The new medication that was prescribed (he thinks it was for gout) is not covered by his insurance ($600 per month). Patient would like a script for a basic inexpensive medication for gout sent to [facility] in pharmacy. If questions please call patient at ph# [phone]

## 2017-05-13 ENCOUNTER — Other Ambulatory Visit: Payer: Self-pay

## 2017-05-13 NOTE — Telephone Encounter (Signed)
Patient is supposed to call back tomorrow & give the information on another medication that was covered or cheaper for gout.

## 2017-05-15 ENCOUNTER — Telehealth: Payer: Self-pay

## 2017-05-15 ENCOUNTER — Other Ambulatory Visit: Payer: Self-pay

## 2017-05-15 MED ORDER — ALLOPURINOL 100 MG PO TABS: 100.0000 mg | ORAL_TABLET | Freq: Every day | ORAL | 3 refills | Status: DC

## 2017-05-15 NOTE — Telephone Encounter (Signed)
Ok, I have sent a prescription to your pharmacy 

## 2017-05-15 NOTE — Addendum Note (Signed)
Addended by: Renato Shin on: 05/15/2017 01:02 PM   Modules accepted: Orders

## 2017-05-15 NOTE — Telephone Encounter (Signed)
This is a cheap generic

## 2017-05-15 NOTE — Telephone Encounter (Signed)
Patient called this morning to let the Judson Roch know that he found a medication for gout called allopurinol needs a PA but it should be covered with PA other prescriptions were too expensive- please call the patient and let him know if this PA has been started

## 2017-06-30 ENCOUNTER — Telehealth: Payer: Self-pay | Admitting: Endocrinology

## 2017-06-30 NOTE — Telephone Encounter (Signed)
Last ov here was 5 mos ago.  Can pt come in for ov, so I can see this?

## 2017-06-30 NOTE — Telephone Encounter (Signed)
Pt has the rash/wound/bruise on his shins still the med cream that you rx is not helping please advise on if he should be seen by a dermatologist?

## 2017-07-01 NOTE — Telephone Encounter (Signed)
Patient scheduled 10am Friday.

## 2017-07-04 ENCOUNTER — Encounter: Payer: Self-pay | Admitting: Endocrinology

## 2017-07-04 ENCOUNTER — Ambulatory Visit: Payer: 59 | Admitting: Endocrinology

## 2017-07-04 VITALS — BP 138/90 | HR 99 | Wt 277.0 lb

## 2017-07-04 DIAGNOSIS — E890 Postprocedural hypothyroidism: Secondary | ICD-10-CM

## 2017-07-04 DIAGNOSIS — R21 Rash and other nonspecific skin eruption: Secondary | ICD-10-CM

## 2017-07-04 LAB — TSH: TSH: 2.78 u[IU]/mL (ref 0.35–4.50)

## 2017-07-04 LAB — SEDIMENTATION RATE: SED RATE: 21 mm/h — AB (ref 0–20)

## 2017-07-04 NOTE — Progress Notes (Signed)
Subjective:    Patient ID: Alexis Norris, male    DOB: 1960-10-22, 57 y.o.   MRN: [id number]  HPI 1 year of severe rash of the legs, and assoc slight itching.  This predated allopurinol.   Past Medical History:  Diagnosis Date  . DYSLIPIDEMIA 01/13/2007   no medicines   . HYPERTENSION 01/13/2007  . 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 I-131 therapy  09/1997   maybe 2000-2001  . INGUINAL HERNIA REPAIR Right 09/28/2013   Procedure: LAPAROSCOPIC RIGHT  INGUINAL HERNIA;  Surgeon: Ralene Ok, MD;  Location: [facility];  Service: General;  Laterality: Right;  . INSERTION OF MESH Right 09/28/2013   Procedure: INSERTION OF MESH;  Surgeon: Ralene Ok, MD;  Location: [facility];  Service: General;  Laterality: Right;  . right knuckle     pin placed in high school    Social History   Socioeconomic History  . Marital status: Married    Spouse name: Not on file  . Number of children: Not on file  . Years of education: Not on file  . Highest education level: Not on file  Social Needs  . Financial resource strain: Not on file  . Food insecurity - worry: Not on file  . Food insecurity - inability: Not on file  . Transportation needs - medical: Not on file  . Transportation needs - non-medical: Not on file  Occupational History  . Occupation: Tour manager  Tobacco Use  . Smoking status: Never Smoker  . Smokeless tobacco: Never Used  Substance and Sexual Activity  . Alcohol use: Yes    Alcohol/week: 0.0 oz    Comment: infrequently  . Drug use: No  . Sexual activity: Not on file  Other Topics Concern  . Not on file  Social History Narrative   Works Marine scientist    Current Outpatient Medications on File Prior to Visit  Medication Sig Dispense Refill  . allopurinol (ZYLOPRIM) 100 MG tablet Take 1 tablet (100 mg total) by mouth daily. 90 tablet 3  . Fluticasone-Salmeterol (ADVAIR) 100-50 MCG/DOSE AEPB Inhale 1 puff into  the lungs 2 (two) times daily. 1 each 3  . levothyroxine (SYNTHROID, LEVOTHROID) 112 MCG tablet TAKE 2 TABLETS BY MOUTH  DAILY BEFORE BREAKFAST 180 tablet 1  . losartan-hydrochlorothiazide (HYZAAR) 100-12.5 MG tablet TAKE ONE-HALF TABLET BY  MOUTH DAILY 45 tablet 3  . triamcinolone cream (KENALOG) 0.1 % Apply 1 application topically 3 (three) times daily. As needed for rash 30 g 2   No current facility-administered medications on file prior to visit.     Allergies  Allergen Reactions  . Simvastatin Nausea Only    REACTION: didn't feel well    Family History  Problem Relation Age of Onset  . Thyroid cancer Sister   . Heart attack Father   . Cancer Mother   . Colon cancer Neg Hx   . Colon polyps Neg Hx     BP 138/90 (BP Location: Left Arm, Patient Position: Sitting, Cuff Size: Normal)   Pulse 99   Wt 277 lb (125.6 kg)   SpO2 97%   BMI 34.62 kg/m    Review of Systems Denies sob and fever.     Objective:   Physical Exam VITAL SIGNS:  See vs page.   GENERAL: no distress.   Ant tibial areas: moderate hyperpigmented eczematous rash.   No vv's.       Assessment & Plan:  Rash,  worse, uncertain etiology  Patient Instructions  Please see a dermatology specialist.  you will receive a phone call, about a day and time for an appointment. blood tests are requested for you today.  We'll let you know about the results.

## 2017-07-04 NOTE — Patient Instructions (Addendum)
Please see a dermatology specialist.  you will receive a phone call, about a day and time for an appointment. blood tests are requested for you today.  We'll let you know about the results.

## 2017-07-15 ENCOUNTER — Telehealth: Payer: Self-pay | Admitting: Endocrinology

## 2017-07-15 NOTE — Telephone Encounter (Signed)
I called patient Alexis Norris stating I was trying to find out status of referral.

## 2017-07-15 NOTE — Telephone Encounter (Signed)
Patient is being referred over to a dermatologist and patient has not heard from anyone about scheduling an appointment. He would like to know who he needs to call to check on this referral. I have looked into the referral and can not see who it was being sent to so I could give him that information    Please advise

## 2017-07-24 ENCOUNTER — Telehealth: Payer: Self-pay | Admitting: Endocrinology

## 2017-07-24 NOTE — Telephone Encounter (Signed)
Patient stated that no one has called him to set up a appointment for dermatology. Please advise

## 2017-07-26 ENCOUNTER — Other Ambulatory Visit: Payer: Self-pay | Admitting: Endocrinology

## 2017-08-12 ENCOUNTER — Telehealth: Payer: Self-pay | Admitting: Endocrinology

## 2017-08-12 NOTE — Telephone Encounter (Signed)
Patient is calling in regards to a referral that was sent out for a dermatologist  He states it has been 5 weeks and he has not heard from anyone  Patient would like to know who he is being referred to so he can call also.   Please advise

## 2017-08-19 ENCOUNTER — Telehealth: Payer: Self-pay

## 2017-08-19 NOTE — Telephone Encounter (Signed)
Patient called and stated that he was upset that no one had called about his dermatology referral placed on 3/8. Patient has had rash & that was the whole purpose of his visit. He wanted the number to our [facility] or his office. I asked that he let me talk to  My office manager first & see what we could find out or get done. I have printed off referral & I am giving it to [place].

## 2017-08-29 ENCOUNTER — Encounter: Payer: Self-pay | Admitting: Family Medicine

## 2017-08-29 ENCOUNTER — Ambulatory Visit (INDEPENDENT_AMBULATORY_CARE_PROVIDER_SITE_OTHER): Payer: 59

## 2017-08-29 ENCOUNTER — Ambulatory Visit: Payer: 59 | Admitting: Family Medicine

## 2017-08-29 VITALS — BP 158/92 | HR 93 | Temp 98.3°F | Ht 76.0 in | Wt 282.0 lb

## 2017-08-29 DIAGNOSIS — M79671 Pain in right foot: Secondary | ICD-10-CM | POA: Diagnosis not present

## 2017-08-29 DIAGNOSIS — M1000 Idiopathic gout, unspecified site: Secondary | ICD-10-CM | POA: Diagnosis not present

## 2017-08-29 DIAGNOSIS — M79675 Pain in left toe(s): Secondary | ICD-10-CM

## 2017-08-29 DIAGNOSIS — I872 Venous insufficiency (chronic) (peripheral): Secondary | ICD-10-CM | POA: Diagnosis not present

## 2017-08-29 DIAGNOSIS — R600 Localized edema: Secondary | ICD-10-CM | POA: Diagnosis not present

## 2017-08-29 DIAGNOSIS — I10 Essential (primary) hypertension: Secondary | ICD-10-CM

## 2017-08-29 LAB — CBC
HCT: 42.1 % (ref 39.0–52.0)
Hemoglobin: 14.3 g/dL (ref 13.0–17.0)
MCHC: 33.9 g/dL (ref 30.0–36.0)
MCV: 84.2 fl (ref 78.0–100.0)
Platelets: 253 10*3/uL (ref 150.0–400.0)
RBC: 5 Mil/uL (ref 4.22–5.81)
RDW: 14.7 % (ref 11.5–15.5)
WBC: 6.1 10*3/uL (ref 4.0–10.5)

## 2017-08-29 LAB — POCT URINALYSIS DIP (MANUAL ENTRY)
Bilirubin, UA: NEGATIVE
Blood, UA: NEGATIVE
GLUCOSE UA: NEGATIVE mg/dL
Ketones, POC UA: NEGATIVE mg/dL
LEUKOCYTES UA: NEGATIVE
Nitrite, UA: NEGATIVE
Protein Ur, POC: NEGATIVE mg/dL
SPEC GRAV UA: 1.025 (ref 1.010–1.025)
UROBILINOGEN UA: 0.2 U/dL
pH, UA: 5.5 (ref 5.0–8.0)

## 2017-08-29 LAB — BASIC METABOLIC PANEL
BUN: 11 mg/dL (ref 6–23)
CHLORIDE: 105 meq/L (ref 96–112)
CO2: 26 meq/L (ref 19–32)
Calcium: 9.1 mg/dL (ref 8.4–10.5)
Creatinine, Ser: 0.92 mg/dL (ref 0.40–1.50)
GFR: 90.94 mL/min (ref >60.00)
Glucose, Bld: 79 mg/dL (ref 70–99)
POTASSIUM: 3.8 meq/L (ref 3.5–5.1)
Sodium: 140 mEq/L (ref 135–145)

## 2017-08-29 MED ORDER — LOSARTAN POTASSIUM-HCTZ 100-25 MG PO TABS: 1.0000 | ORAL_TABLET | Freq: Every day | ORAL | 3 refills | Status: DC

## 2017-08-29 MED ORDER — PREDNISONE 50 MG PO TABS: ORAL_TABLET | ORAL | 0 refills | Status: DC

## 2017-08-29 NOTE — Assessment & Plan Note (Signed)
Patient with nodules on his right posterior heel and bilateral great toes.  His plain film shows a little bit of calcific tendinosis, however no other obvious abnormalities based on my read.  We will await radiology read.  Given he is still having a bit of pain in the area, we will start a course of prednisone as noted above.  We will continue allopurinol for the time being.  He will follow-up with me in about 4 weeks.

## 2017-08-29 NOTE — Progress Notes (Signed)
Subjective:  Alexis Norris is a 57 y.o. male who presents today with a chief complaint of elevated blood pressure and to establish care.   HPI:  Hypertension, chronic problem, new to provider Several year history.  Currently on losartan-HCTZ 100-25 mg tablet once daily.  No reported chest pain or shortness of breath.  Has noticed increased lower extremity swelling over the last several days.  Rash, chronic problem, new to provider Patient with several month history of rash on his bilateral lower legs.  Rash has increased in size over that time.  Sometimes is very pruritic.  It occasionally uses and bleeds.  He has tried topical steroid cream without significant improvement.  No clear precipitating event.  No obvious alleviating or aggravating factors.  Gout/Joint pain, chronic problems, new to provider Several year history.  Patient currently on allopurinol 100 mg daily.  Recently had gout flares in his bilateral great toes and right heel.  The pain and redness have subsided, however he has noticed continued swelling at the area.  Lower extremity edema, new problem Patient has always noticed that his lower extremities have been large in circumference.  Has noticed worsening swelling over the last few weeks.  No obvious precipitating events.  No chest pain.  No shortness of breath.  No orthopnea.  ROS: Per HPI, otherwise a complete review of systems was negative.   PMH:  The following were reviewed and entered/updated in epic: Past Medical History:  Diagnosis Date  . DYSLIPIDEMIA 01/13/2007   no medicines   . HYPERTENSION 01/13/2007  . 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 Stasis dermatitis 08/29/2017  . Lower extremity edema 08/29/2017  . Rash 07/04/2017  . Gout 02/06/2016  . Rt inguinal pain 01/20/2013  . Knee pain 12/15/2012  . Encounter for long-term (current) use of other medications 08/26/2011  . Allergic  rhinitis, cause unspecified 08/26/2011  . 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 Dyslipidemia 01/13/2007  . Essential hypertension 01/13/2007   Past Surgical History:  Procedure Laterality Date  . I-131 therapy  09/1997   maybe 2000-2001  . INGUINAL HERNIA REPAIR Right 09/28/2013   Procedure: LAPAROSCOPIC RIGHT  INGUINAL HERNIA;  Surgeon: Ralene Ok, MD;  Location: [facility];  Service: General;  Laterality: Right;  . INSERTION OF MESH Right 09/28/2013   Procedure: INSERTION OF MESH;  Surgeon: Ralene Ok, MD;  Location: [place] [facility];  Service: General;  Laterality: Right;  . right knuckle     pin placed in high school    Family History  Problem Relation Age of Onset  . Thyroid cancer Sister   . Heart attack Father   . Cancer Mother   . Colon cancer Neg Hx   . Colon polyps Neg Hx     Medications- reviewed and updated Current Outpatient Medications  Medication Sig Dispense Refill  . allopurinol (ZYLOPRIM) 100 MG tablet Take 1 tablet (100 mg total) by mouth daily. 90 tablet 3  . levothyroxine (SYNTHROID, LEVOTHROID) 112 MCG tablet TAKE 2 TABLETS BY MOUTH  DAILY BEFORE BREAKFAST 180 tablet 1  . losartan-hydrochlorothiazide (HYZAAR) 100-25 MG tablet Take 1 tablet by mouth daily. 90 tablet 3  . predniSONE (DELTASONE) 50 MG tablet Take 1 tablet daily for 5 days. Then 1/2 tablet daily for 2 days. 5 tablet 0   No current facility-administered medications for this visit.     Allergies-reviewed and updated Allergies  Allergen Reactions  . Simvastatin Nausea Only  REACTION: didn't feel well    Social History   Socioeconomic History  . Marital status: Married    Spouse name: Not on file  . Number of children: Not on file  . Years of education: Not on file  . Highest education level: Not on file  Occupational History  . Occupation: Designer, fashion/clothing  . Financial resource strain: Not on file  . Food insecurity:    Worry: Not on file    Inability:  Not on file  . Transportation needs:    Medical: Not on file    Non-medical: Not on file  Tobacco Use  . Smoking status: Never Smoker  . Smokeless tobacco: Never Used  Substance and Sexual Activity  . Alcohol use: Yes    Alcohol/week: 0.0 oz    Comment: infrequently  . Drug use: No  . Sexual activity: Not on file  Lifestyle  . Physical activity:    Days per week: Not on file    Minutes per session: Not on file  . Stress: Not on file  Relationships  . Social connections:    Talks on phone: Not on file    Gets together: Not on file    Attends religious service: Not on file    Active member of club or organization: Not on file    Attends meetings of clubs or organizations: Not on file    Relationship status: Not on file  Other Topics Concern  . Not on file  Social History Narrative   Works Marine scientist     Objective:  Physical Exam: BP (!) 158/92   Pulse 93   Temp 98.3 F (36.8 C) (Oral)   Ht 6\' 4"  (1.93 m)   Wt 282 lb (127.9 kg)   SpO2 98%   BMI 34.33 kg/m   Gen: NAD, resting comfortably CV: RRR with no murmurs appreciated Pulm: NWOB, CTAB with no crackles, wheezes, or rhonchi GI: Normal bowel sounds present. Soft, Nontender, Nondistended. MSK: Hypertrophy of bilateral first MTP joints.  Nodule noted on posterior aspect of right calcaneus. Skin: Hyperpigmented rash on lower half of bilateral lower extremities with scattered excoriations and overlying scale. Neuro: Grossly normal, moves all extremities Psych: Normal affect and thought content  Assessment/Plan:  Essential hypertension Above goal.  We will increase his dose of HCTZ to 25 mg.  I will send in a new prescription for Hyzaar are tablet of 100-25 mg.  He will follow up in 3-4 weeks.   Stasis dermatitis Rash on legs consistent with stasis dermatitis.  Given his severe pruritis and excoriations, we will start a course of prednisone. He has dermatology follow up scheduled.   Will work on LE edema- see  below problem.   Gout Patient with nodules on his right posterior heel and bilateral great toes.  His plain film shows a little bit of calcific tendinosis, however no other obvious abnormalities based on my read.  We will await radiology read.  Given he is still having a bit of pain in the area, we will start a course of prednisone as noted above.  We will continue allopurinol for the time being.  He will follow-up with me in about 4 weeks.  Lower extremity edema Likely secondary to venous insufficiency.  Patient does not have anyDyspnea, chest pain, or orthopnea to suggest cardiac etiology.  Will check CBC, CMET, and UA to evaluate for other causes.  Discussed conservative measures including leg elevation, compression stockings, and low-salt diet.  We  will also increase his HCTZ as noted above which should hopefully help some.  He will follow-up with me in about 4 weeks.   Algis Greenhouse. Jerline Pain, MD 08/29/2017 12:42 PM

## 2017-08-29 NOTE — Assessment & Plan Note (Signed)
Above goal.  We will increase his dose of HCTZ to 25 mg.  I will send in a new prescription for Hyzaar are tablet of 100-25 mg.  He will follow up in 3-4 weeks.

## 2017-08-29 NOTE — Patient Instructions (Signed)
It was very nice to meet your today!  I will send in a new blood pressure pill with an increased dose of HCTZ.  Please start the prednisone for your joint swelling as well as your rash.  Your rash is due to stasis dermatitis.  Please try keeping her legs elevated and using compression stockings.  Please also avoid salt as much as possible.  Your swelling is likely due to venous insufficiency.  This is a benign condition that occurs as we age due to the valves in your veins in your legs becoming less efficient.  We will check blood work to make sure there are no other causes for your lower leg swelling.  Come back to see me in 1 months ,or sooner as needed.  Take care, Dr Jerline Pain

## 2017-08-29 NOTE — Progress Notes (Signed)
Dr Marigene Ehlers interpretation of your lab work:  Your urine test and blood tests are all normal. Your leg swelling is most likely due to venous insufficiency as we discussed in your office visit. We do not need to make any changes to the treatment plan we discussed at your visit.    If you have any additional questions, please give [place] a call or send [place] a message through [place].  Take care, Dr Jerline Pain

## 2017-08-29 NOTE — Assessment & Plan Note (Signed)
Rash on legs consistent with stasis dermatitis.  Given his severe pruritis and excoriations, we will start a course of prednisone. He has dermatology follow up scheduled.   Will work on LE edema- see below problem.

## 2017-08-29 NOTE — Assessment & Plan Note (Signed)
Likely secondary to venous insufficiency.  Patient does not have anyDyspnea, chest pain, or orthopnea to suggest cardiac etiology.  Will check CBC, CMET, and UA to evaluate for other causes.  Discussed conservative measures including leg elevation, compression stockings, and low-salt diet.  We will also increase his HCTZ as noted above which should hopefully help some.  He will follow-up with me in about 4 weeks.

## 2017-09-01 ENCOUNTER — Other Ambulatory Visit: Payer: Self-pay

## 2017-09-01 MED ORDER — PREDNISONE 50 MG PO TABS: ORAL_TABLET | ORAL | 0 refills | Status: DC

## 2017-09-03 ENCOUNTER — Telehealth: Payer: Self-pay | Admitting: Family Medicine

## 2017-09-03 NOTE — Telephone Encounter (Signed)
Copied from [place]. Topic: Quick Communication - Rx Refill/Question >> Sep 03, 2017  2:09 PM Scherrie Gerlach wrote: Medication: predniSONE (DELTASONE) 50 MG tablet Pt was given another Rx for 6 tabs of this med. Pt thought this new Rx was just supposed to be for one tab, because pharmacy original Rx was for 5 tabs only. Pharmacy gave him the 5 tabs last week and then gave him new script on Monday. Pt wants to make sure the dr doesn't want him to continue on the rest of the prednisone.  Pt states the rash is not better. It feels better, but does not look any better.  Please advise

## 2017-09-04 NOTE — Telephone Encounter (Signed)
OK for him to do another week of prednisone if he is still having gout pain or itchiness. Otherwise he can stop the medication.   The hyperpigmentation in his rash will take weeks to improve but the itchiness should be improving. I would like to see him back in about a month as we discussed, or sooner as needed.  Algis Greenhouse. Jerline Pain, MD 09/04/2017 11:28 AM

## 2017-09-04 NOTE — Telephone Encounter (Signed)
Please advise 

## 2017-09-30 ENCOUNTER — Ambulatory Visit: Payer: 59 | Admitting: Family Medicine

## 2017-10-02 ENCOUNTER — Ambulatory Visit: Payer: 59 | Admitting: Family Medicine

## 2017-10-03 ENCOUNTER — Ambulatory Visit: Payer: 59 | Admitting: Family Medicine

## 2017-10-03 ENCOUNTER — Encounter: Payer: Self-pay | Admitting: Family Medicine

## 2017-10-03 DIAGNOSIS — I10 Essential (primary) hypertension: Secondary | ICD-10-CM | POA: Diagnosis not present

## 2017-10-03 DIAGNOSIS — M10272 Drug-induced gout, left ankle and foot: Secondary | ICD-10-CM

## 2017-10-03 DIAGNOSIS — I872 Venous insufficiency (chronic) (peripheral): Secondary | ICD-10-CM

## 2017-10-03 NOTE — Assessment & Plan Note (Signed)
Continue allopurinol.  He does have some hyperpigmentation to the dorsal aspect of his great toes and midfoot.  This is most likely secondary to irritation from ill fitting shoes.  Advised patient to use clobetasol ointment to areas of severe irritation and make sure he has good fitting footwear.  If continues to be a problem, would consider referral to sports medicine or podiatry.  Will need uric acid level with next blood draw.

## 2017-10-03 NOTE — Progress Notes (Signed)
   Subjective:  Alexis Norris is a 57 y.o. male who presents today with a chief complaint of stasis dermatitis follow-up.   HPI:  Stasis dermatitis, established problem, improving Patient seen initially about a month ago for this.  He has since seen dermatology who prescribed him clobetasol cream and instructed him to use compression socks.  Rash has significantly improved though has some residual hyperpigmentation.  Gout, established problem, stable Currently on allopurinol 100 mg daily.  Still has right toe pain.  Tinea pedis, new problem Present for the past several weeks to months.  Worsened recently.  No treatments tried.  Hypertension, established problem, stable Patient seen about a month ago for this.  At that time we increased his Hyzaar to 100-25.  He has tolerated this well without side effects.  Blood pressures at home usually in the 130s to 140s over 80s.  ROS: Per HPI  PMH: He reports that he has never smoked. He has never used smokeless tobacco. He reports that he drinks alcohol. He reports that he does not use drugs.   Objective:  Physical Exam: BP (!) 140/96 (BP Location: Left Arm, Patient Position: Sitting, Cuff Size: Normal)   Pulse 77   Temp 98.7 F (37.1 C) (Oral)   Ht 6\' 4"  (1.93 m)   Wt 283 lb 12.8 oz (128.7 kg)   SpO2 99%   BMI 34.55 kg/m   Gen: NAD, resting comfortably CV: RRR with no murmurs appreciated Pulm: NWOB, CTAB with no crackles, wheezes, or rhonchi Skin: Macerated erythematous rash noted to bilateral feet and interdigital spaces.  Hyperpigmentation noted along shins bilaterally.  Assessment/Plan:  Stasis dermatitis Improving.  Continue clobetasol as directed by his dermatologist.  Continue compression stockings.  Gout Continue allopurinol.  He does have some hyperpigmentation to the dorsal aspect of his great toes and midfoot.  This is most likely secondary to irritation from ill fitting shoes.  Advised patient to use clobetasol  ointment to areas of severe irritation and make sure he has good fitting footwear.  If continues to be a problem, would consider referral to sports medicine or podiatry.  Will need uric acid level with next blood draw.  Essential hypertension Borderline today.  Has been at goal at home.  Will continue current dose of losartan-HCTZ 100-25.  Advised close home blood pressure monitoring with goal 140/90 or lower.  He will work on diet and exercise and we will check his follow-up appointment in about 4 months.  Tinea pedis Recommended use of over-the-counter Lotrimin or Tinactin. Offered Rx for ketoconazole however he deferred.   Preventative Healthcare Pt will follow up in about 4 months for CPE.  Algis Greenhouse. Jerline Pain, MD 10/03/2017 3:07 PM

## 2017-10-03 NOTE — Assessment & Plan Note (Signed)
Improving.  Continue clobetasol as directed by his dermatologist.  Continue compression stockings.

## 2017-10-03 NOTE — Patient Instructions (Addendum)
It was very nice to see you today!  Please use lotrimin or tinactin for your foot.  Let me know if you need me to send in a prescription for this.  Please keep a close eye on your blood pressure and let me know if it is currently elevated to 140/90 or higher.  If you work on diet and exercise this should continue to improve.  You can continue using the clobetasol to the rash on your legs.  I think your toe swelling is due to irritation from your shoes.  Please treat the fungal infection first.  You can then use clobetasol to the area.  Come back to see me on or after 01/30/2018 for your annual physical with blood work.  Come back to see me sooner as needed.  Take care, Dr Jerline Pain

## 2017-10-03 NOTE — Assessment & Plan Note (Signed)
Borderline today.  Has been at goal at home.  Will continue current dose of losartan-HCTZ 100-25.  Advised close home blood pressure monitoring with goal 140/90 or lower.  He will work on diet and exercise and we will check his follow-up appointment in about 4 months.

## 2017-12-30 ENCOUNTER — Other Ambulatory Visit: Payer: Self-pay | Admitting: Endocrinology

## 2018-03-23 ENCOUNTER — Encounter: Payer: Self-pay | Admitting: Family Medicine

## 2018-03-23 ENCOUNTER — Ambulatory Visit (INDEPENDENT_AMBULATORY_CARE_PROVIDER_SITE_OTHER): Payer: 59 | Admitting: Family Medicine

## 2018-03-23 VITALS — BP 132/74 | HR 92 | Temp 98.9°F | Ht 76.0 in | Wt 278.8 lb

## 2018-03-23 DIAGNOSIS — B353 Tinea pedis: Secondary | ICD-10-CM | POA: Insufficient documentation

## 2018-03-23 DIAGNOSIS — Z0001 Encounter for general adult medical examination with abnormal findings: Secondary | ICD-10-CM

## 2018-03-23 DIAGNOSIS — M79676 Pain in unspecified toe(s): Secondary | ICD-10-CM

## 2018-03-23 DIAGNOSIS — I10 Essential (primary) hypertension: Secondary | ICD-10-CM | POA: Diagnosis not present

## 2018-03-23 DIAGNOSIS — E890 Postprocedural hypothyroidism: Secondary | ICD-10-CM | POA: Diagnosis not present

## 2018-03-23 DIAGNOSIS — H6121 Impacted cerumen, right ear: Secondary | ICD-10-CM

## 2018-03-23 DIAGNOSIS — H6123 Impacted cerumen, bilateral: Secondary | ICD-10-CM | POA: Diagnosis not present

## 2018-03-23 DIAGNOSIS — R600 Localized edema: Secondary | ICD-10-CM

## 2018-03-23 DIAGNOSIS — M109 Gout, unspecified: Secondary | ICD-10-CM

## 2018-03-23 DIAGNOSIS — Z125 Encounter for screening for malignant neoplasm of prostate: Secondary | ICD-10-CM | POA: Diagnosis not present

## 2018-03-23 DIAGNOSIS — E785 Hyperlipidemia, unspecified: Secondary | ICD-10-CM

## 2018-03-23 DIAGNOSIS — I872 Venous insufficiency (chronic) (peripheral): Secondary | ICD-10-CM

## 2018-03-23 LAB — COMPREHENSIVE METABOLIC PANEL
ALK PHOS: 60 U/L (ref 39–117)
ALT: 12 U/L (ref 0–53)
AST: 13 U/L (ref 0–37)
Albumin: 4.3 g/dL (ref 3.5–5.2)
BUN: 15 mg/dL (ref 6–23)
CO2: 26 mEq/L (ref 19–32)
CREATININE: 0.88 mg/dL (ref 0.40–1.50)
Calcium: 8.9 mg/dL (ref 8.4–10.5)
Chloride: 106 mEq/L (ref 96–112)
GFR: 95.53 mL/min (ref >60.00)
GLUCOSE: 89 mg/dL (ref 70–99)
POTASSIUM: 3.6 meq/L (ref 3.5–5.1)
SODIUM: 141 meq/L (ref 135–145)
TOTAL PROTEIN: 6.7 g/dL (ref 6.0–8.3)
Total Bilirubin: 0.8 mg/dL (ref 0.2–1.2)

## 2018-03-23 LAB — CBC
HCT: 40.7 % (ref 39.0–52.0)
HEMOGLOBIN: 13.7 g/dL (ref 13.0–17.0)
MCHC: 33.8 g/dL (ref 30.0–36.0)
MCV: 84.3 fl (ref 78.0–100.0)
PLATELETS: 241 10*3/uL (ref 150.0–400.0)
RBC: 4.83 Mil/uL (ref 4.22–5.81)
RDW: 14.8 % (ref 11.5–15.5)
WBC: 6.5 10*3/uL (ref 4.0–10.5)

## 2018-03-23 LAB — LIPID PANEL
Cholesterol: 134 mg/dL (ref 0–200)
HDL: 43.8 mg/dL (ref >39.00)
LDL Cholesterol: 77 mg/dL (ref 0–99)
NONHDL: 90.45
Total CHOL/HDL Ratio: 3
Triglycerides: 69 mg/dL (ref 0.0–149.0)
VLDL: 13.8 mg/dL (ref 0.0–40.0)

## 2018-03-23 LAB — PSA: PSA: 0.66 ng/mL (ref 0.10–4.00)

## 2018-03-23 LAB — URIC ACID: Uric Acid, Serum: 5.9 mg/dL (ref 4.0–7.8)

## 2018-03-23 LAB — TSH: TSH: 1.89 u[IU]/mL (ref 0.35–4.50)

## 2018-03-23 MED ORDER — CLOBETASOL PROPIONATE 0.05 % EX OINT: TOPICAL_OINTMENT | CUTANEOUS | 2 refills | Status: DC

## 2018-03-23 NOTE — Assessment & Plan Note (Addendum)
Symptoms are stable.  We will refill his clobetasol.  Recommended twice daily application for the next 1 to 2 weeks, then as needed.  Also discussed importance of improving his lower extremity edema via compression stockings.

## 2018-03-23 NOTE — Progress Notes (Addendum)
Subjective:  Alexis Norris is a 57 y.o. male who presents today for his annual comprehensive physical exam.    HPI:  He has no acute complaints today.   His stable, chronic conditions are outlined below: 1. Gout.  On allopurinol 100 mg daily.  Occasionally has pain and bilateral great toes. 2. HTN.  Last seen about 6 months ago for this.  He is on Hyzaar and tolerating well. 3. Hypothyroidism.  Last seen 6 months ago for this.  Currently on levothyroxine 124 mcg daily. 4. Dermatitis.  Also seen about 6 months ago for this.  Needs a refill on clobetasol today.  Lifestyle Diet: Cutting back on red meats. Trying to eat a more healthy diet.  Exercise: More active with work. No specific exercises.   Depression screen PHQ 2/9 10/03/2017  Decreased Interest 0  Down, Depressed, Hopeless 0  PHQ - 2 Score 0    Health Maintenance Due  Topic Date Due  . HIV Screening  02/18/1978  . TETANUS/TDAP  06/27/2012     ROS: Per HPI, otherwise a complete review of systems was negative.   PMH:  The following were reviewed and entered/updated in epic: Past Medical History:  Diagnosis Date  . DYSLIPIDEMIA 01/13/2007   no medicines   . HYPERTENSION 01/13/2007  . 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 Tinea pedis of both feet 03/23/2018  . Stasis dermatitis 08/29/2017  . Lower extremity edema 08/29/2017  . Gout 02/06/2016  . Rt inguinal pain 01/20/2013  . Knee pain 12/15/2012  . Allergic rhinitis, cause unspecified 08/26/2011  . 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 Dyslipidemia 01/13/2007  . Essential hypertension 01/13/2007   Past Surgical History:  Procedure Laterality Date  . I-131 therapy  09/1997   maybe 2000-2001  . INGUINAL HERNIA REPAIR Right 09/28/2013   Procedure: LAPAROSCOPIC RIGHT  INGUINAL HERNIA;  Surgeon: Ralene Ok, MD;  Location: [facility];  Service: General;  Laterality: Right;  .  INSERTION OF MESH Right 09/28/2013   Procedure: INSERTION OF MESH;  Surgeon: Ralene Ok, MD;  Location: [facility];  Service: General;  Laterality: Right;  . right knuckle     pin placed in high school    Family History  Problem Relation Age of Onset  . Thyroid cancer Sister   . Heart attack Father   . Cancer Mother   . Colon cancer Neg Hx   . Colon polyps Neg Hx     Medications- reviewed and updated Current Outpatient Medications  Medication Sig Dispense Refill  . allopurinol (ZYLOPRIM) 100 MG tablet Take 1 tablet (100 mg total) by mouth daily. 90 tablet 3  . levothyroxine (SYNTHROID, LEVOTHROID) 112 MCG tablet TAKE 2 TABLETS BY MOUTH  DAILY BEFORE BREAKFAST 180 tablet 1  . losartan-hydrochlorothiazide (HYZAAR) 100-25 MG tablet Take 1 tablet by mouth daily. 90 tablet 3  . clobetasol ointment (TEMOVATE) 4.09 % APPLY 1 APPLICATION TO AFFECTED AREA TWICE A DAY FOR 14 DAYS THEN ONCE DAILY M F 60 g 2   No current facility-administered medications for this visit.     Allergies-reviewed and updated Allergies  Allergen Reactions  . Simvastatin Nausea Only    REACTION: didn't feel well    Social History   Socioeconomic History  . Marital status: Married    Spouse name: Not on file  . Number of children: Not on file  . Years of education: Not on file  . Highest education level:  Not on file  Occupational History  . Occupation: Designer, fashion/clothing  . Financial resource strain: Not on file  . Food insecurity:    Worry: Not on file    Inability: Not on file  . Transportation needs:    Medical: Not on file    Non-medical: Not on file  Tobacco Use  . Smoking status: Never Smoker  . Smokeless tobacco: Never Used  Substance and Sexual Activity  . Alcohol use: Yes    Alcohol/week: 0.0 standard drinks    Comment: infrequently  . Drug use: No  . Sexual activity: Not on file  Lifestyle  . Physical activity:    Days per week: Not on file    Minutes per session: Not on  file  . Stress: Not on file  Relationships  . Social connections:    Talks on phone: Not on file    Gets together: Not on file    Attends religious service: Not on file    Active member of club or organization: Not on file    Attends meetings of clubs or organizations: Not on file    Relationship status: Not on file  Other Topics Concern  . Not on file  Social History Narrative   Works Marine scientist    Objective:  Physical Exam: BP 132/74 (BP Location: Left Arm, Patient Position: Sitting, Cuff Size: Large)   Pulse 92   Temp 98.9 F (37.2 C) (Oral)   Ht 6\' 4"  (1.93 m)   Wt 278 lb 12.8 oz (126.5 kg)   SpO2 97%   BMI 33.94 kg/m   Body mass index is 33.94 kg/m. Wt Readings from Last 3 Encounters:  03/23/18 278 lb 12.8 oz (126.5 kg)  10/03/17 283 lb 12.8 oz (128.7 kg)  08/29/17 282 lb (127.9 kg)   Gen: NAD, resting comfortably HEENT: Right EAC with impacted cerumen.  Left EAC clear. OP clear. No thyromegaly noted.  CV: RRR with no murmurs appreciated Pulm: NWOB, CTAB with no crackles, wheezes, or rhonchi GI: Normal bowel sounds present. Soft, Nontender, Nondistended. MSK: Bilateral lower extremities with 2+ pitting edema to knees.  Hypertrophy of bilateral first MTP joints.  Nodule noted on posterior aspect of right calcaneus near the Achilles tendon insertion. Skin: Bilateral lower extremities with hyperpigmented rash with scattered excoriations and overlying scale. Neuro: CN2-12 grossly intact. Strength 5/5 in upper and lower extremities. Reflexes symmetric and intact bilaterally.  Psych: Normal affect and thought content  Cerumen successfully irrigated by CMA.  Patient tolerated well without complication.  Assessment/Plan:  Stasis dermatitis Symptoms are stable.  We will refill his clobetasol.  Recommended twice daily application for the next 1 to 2 weeks, then as needed.  Also discussed importance of improving his lower extremity edema via compression stockings.  Tinea  pedis of both feet Continue over-the-counter antifungals.  Lower extremity edema Discussed importance of low-sodium diet, leg elevation, and compression stockings.  Check CMET CBC today.  Hypothyroidism following radioiodine therapy Continue current dose of Synthroid 224 mcg daily.  Check TSH.  Gout Continue allopurinol 100 mg daily.  Check uric acid.  Essential hypertension At goal.  Continue losartan-HCTZ 100-25 mg once daily.  Check CBC and CMET.  Dyslipidemia Check lipid panel.  Foot pain Referral placed to podiatry.  Preventative Healthcare: Check lipid panel and PSA.  Patient Counseling(The following topics were reviewed and/or handout was given):  -Nutrition: Stressed importance of moderation in sodium/caffeine intake, saturated fat and cholesterol, caloric balance, sufficient intake of  fresh fruits, vegetables, and fiber.  -Stressed the importance of regular exercise.   -Substance Abuse: Discussed cessation/primary prevention of tobacco, alcohol, or other drug use; driving or other dangerous activities under the influence; availability of treatment for abuse.   -Injury prevention: Discussed safety belts, safety helmets, smoke detector, smoking near bedding or upholstery.   -Sexuality: Discussed sexually transmitted diseases, partner selection, use of condoms, avoidance of unintended pregnancy and contraceptive alternatives.   -Dental health: Discussed importance of regular tooth brushing, flossing, and dental visits.  -Health maintenance and immunizations reviewed. Please refer to Health maintenance section.  Return to care in 1 year for next preventative visit.   Algis Greenhouse. Jerline Pain, MD 03/23/2018 12:02 PM

## 2018-03-23 NOTE — Assessment & Plan Note (Signed)
Discussed importance of low-sodium diet, leg elevation, and compression stockings.  Check CMET CBC today.

## 2018-03-23 NOTE — Assessment & Plan Note (Signed)
Check lipid panel  

## 2018-03-23 NOTE — Assessment & Plan Note (Signed)
Continue over-the-counter antifungals.

## 2018-03-23 NOTE — Assessment & Plan Note (Signed)
At goal.  Continue losartan-HCTZ 100-25 mg once daily.  Check CBC and CMET.

## 2018-03-23 NOTE — Patient Instructions (Signed)
It was very nice to see you today!  Keep up the good work!  I will send in a refill of your clobetasol.  Please use twice daily for the next 1 to 2 weeks, then as needed.  We will flush out your ears today.  Please let me know if your blood pressure is persistently 140/90 or higher.  We will check blood work today.  I will also send in referral to a podiatrist.  Please come back to see me in 1 year for your next physical, or sooner as needed.  Take care, Dr Jerline Pain   Preventive Care 40-64 Years, Male Preventive care refers to lifestyle choices and visits with your health care provider that can promote health and wellness. What does preventive care include?  A yearly physical exam. This is also called an annual well check.  Dental exams once or twice a year.  Routine eye exams. Ask your health care provider how often you should have your eyes checked.  Personal lifestyle choices, including: ? Daily care of your teeth and gums. ? Regular physical activity. ? Eating a healthy diet. ? Avoiding tobacco and drug use. ? Limiting alcohol use. ? Practicing safe sex. ? Taking low-dose aspirin every day starting at age 66. What happens during an annual well check? The services and screenings done by your health care provider during your annual well check will depend on your age, overall health, lifestyle risk factors, and family history of disease. Counseling Your health care provider may ask you questions about your:  Alcohol use.  Tobacco use.  Drug use.  Emotional well-being.  Home and relationship well-being.  Sexual activity.  Eating habits.  Work and work Statistician.  Screening You may have the following tests or measurements:  Height, weight, and BMI.  Blood pressure.  Lipid and cholesterol levels. These may be checked every 5 years, or more frequently if you are over 29 years old.  Skin check.  Lung cancer screening. You may have this screening every  year starting at age 15 if you have a 30-pack-year history of smoking and currently smoke or have quit within the past 15 years.  Fecal occult blood test (FOBT) of the stool. You may have this test every year starting at age 65.  Flexible sigmoidoscopy or colonoscopy. You may have a sigmoidoscopy every 5 years or a colonoscopy every 10 years starting at age 79.  Prostate cancer screening. Recommendations will vary depending on your family history and other risks.  Hepatitis C blood test.  Hepatitis B blood test.  Sexually transmitted disease (STD) testing.  Diabetes screening. This is done by checking your blood sugar (glucose) after you have not eaten for a while (fasting). You may have this done every 1-3 years.  Discuss your test results, treatment options, and if necessary, the need for more tests with your health care provider. Vaccines Your health care provider may recommend certain vaccines, such as:  Influenza vaccine. This is recommended every year.  Tetanus, diphtheria, and acellular pertussis (Tdap, Td) vaccine. You may need a Td booster every 10 years.  Varicella vaccine. You may need this if you have not been vaccinated.  Zoster vaccine. You may need this after age 20.  Measles, mumps, and rubella (MMR) vaccine. You may need at least one dose of MMR if you were born in 1957 or later. You may also need a second dose.  Pneumococcal 13-valent conjugate (PCV13) vaccine. You may need this if you have certain conditions and  have not been vaccinated.  Pneumococcal polysaccharide (PPSV23) vaccine. You may need one or two doses if you smoke cigarettes or if you have certain conditions.  Meningococcal vaccine. You may need this if you have certain conditions.  Hepatitis A vaccine. You may need this if you have certain conditions or if you travel or work in places where you may be exposed to hepatitis A.  Hepatitis B vaccine. You may need this if you have certain conditions or  if you travel or work in places where you may be exposed to hepatitis B.  Haemophilus influenzae type b (Hib) vaccine. You may need this if you have certain risk factors.  Talk to your health care provider about which screenings and vaccines you need and how often you need them. This information is not intended to replace advice given to you by your health care provider. Make sure you discuss any questions you have with your health care provider. Document Released: 05/12/2015 Document Revised: 01/03/2016 Document Reviewed: 02/14/2015 Elsevier Interactive Patient Education  [employer].

## 2018-03-23 NOTE — Assessment & Plan Note (Signed)
Continue allopurinol 100 mg daily.  Check uric acid.

## 2018-03-23 NOTE — Assessment & Plan Note (Addendum)
Continue current dose of Synthroid 224 mcg daily.  Check TSH.

## 2018-03-24 NOTE — Progress Notes (Signed)
Dr Marigene Ehlers interpretation of your lab work:  Good news! Your blood work is all NORMAL. We do not need to make any changes to your treatment plan at this time. Keep up the good work and we can recheck next year.    If you have any additional questions, please give [place] a call or send [place] a message through [place].  Take care, Dr Jerline Pain

## 2018-04-07 ENCOUNTER — Ambulatory Visit: Payer: Self-pay | Admitting: Podiatry

## 2018-04-23 ENCOUNTER — Ambulatory Visit (INDEPENDENT_AMBULATORY_CARE_PROVIDER_SITE_OTHER): Payer: 59

## 2018-04-23 ENCOUNTER — Ambulatory Visit (INDEPENDENT_AMBULATORY_CARE_PROVIDER_SITE_OTHER): Payer: 59 | Admitting: Podiatry

## 2018-04-23 VITALS — BP 150/88 | HR 82

## 2018-04-23 DIAGNOSIS — M7752 Other enthesopathy of left foot: Secondary | ICD-10-CM | POA: Diagnosis not present

## 2018-04-23 DIAGNOSIS — I890 Lymphedema, not elsewhere classified: Secondary | ICD-10-CM

## 2018-04-23 DIAGNOSIS — R600 Localized edema: Secondary | ICD-10-CM | POA: Diagnosis not present

## 2018-04-23 DIAGNOSIS — M7750 Other enthesopathy of unspecified foot: Secondary | ICD-10-CM

## 2018-04-23 DIAGNOSIS — M109 Gout, unspecified: Secondary | ICD-10-CM

## 2018-04-23 DIAGNOSIS — M7751 Other enthesopathy of right foot: Secondary | ICD-10-CM

## 2018-04-23 MED ORDER — COLCHICINE 0.6 MG PO TABS: 0.6000 mg | ORAL_TABLET | Freq: Every day | ORAL | 0 refills | Status: DC

## 2018-04-23 NOTE — Progress Notes (Signed)
  Subjective:  Patient ID: Alexis Norris, male    DOB: 1960-09-03,  MRN: [id number]  Chief Complaint  Patient presents with  . Foot Pain    pain in both great toes/gout/ ankle/heel pain/plantar fas/ history of gout    57 y.o. male presents with the above complaint.  Reports chronic pain to the both great toes.  States that he knows he has swelling to both legs that is chronic in nature as well.  Has a history of gout for which he takes allopurinol has never taken any medicine like colchicine for acute gout.  Also takes hydrochlorothiazide which his PCP said could help.  Works on his feet as a Freight forwarder.  Reports chronic swelling and pain to the lower extremities.  Review of Systems: Negative except as noted in the HPI. Denies N/V/F/Ch.  Past Medical History:  Diagnosis Date  . DYSLIPIDEMIA 01/13/2007   no medicines   . HYPERTENSION 01/13/2007  . 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  allopurinol (ZYLOPRIM) 100 MG tablet, Take 1 tablet (100 mg total) by mouth daily., Disp: 90 tablet, Rfl: 3 .  clobetasol ointment (TEMOVATE) 4.27 %, APPLY 1 APPLICATION TO AFFECTED AREA TWICE A DAY FOR 14 DAYS THEN ONCE DAILY M F, Disp: 60 g, Rfl: 2 .  colchicine 0.6 MG tablet, Take 1 tablet (0.6 mg total) by mouth daily., Disp: 30 tablet, Rfl: 0 .  levothyroxine (SYNTHROID, LEVOTHROID) 112 MCG tablet, TAKE 2 TABLETS BY MOUTH  DAILY BEFORE BREAKFAST, Disp: 180 tablet, Rfl: 1 .  losartan-hydrochlorothiazide (HYZAAR) 100-25 MG tablet, Take 1 tablet by mouth daily., Disp: 90 tablet, Rfl: 3  Social History   Tobacco Use  Smoking Status Never Smoker  Smokeless Tobacco Never Used    Allergies  Allergen Reactions  . Simvastatin Nausea Only    REACTION: didn't feel well   Objective:   Vitals:   04/23/18 1543  BP: (!) 150/88  Pulse: 82   There is no height or weight on file to calculate BMI. Constitutional Well developed. Well nourished.  Vascular Dorsalis  pedis pulses palpable bilaterally. Posterior tibial pulses palpable bilaterally. Capillary refill normal to all digits.  No cyanosis or clubbing noted. Pedal hair growth normal.  Neurologic Normal speech. Oriented to person, place, and time. Epicritic sensation to light touch grossly present bilaterally.  Dermatologic Nails well groomed and normal in appearance. Brawny edema noted bilaterally with trophic skin changes  Orthopedic: Normal joint ROM without pain or crepitus bilaterally. No visible deformities. No bony tenderness.   Edema Circumference: Date: 12/26 Left Right  Dorsum Foot 29 cm 28 cm  Ankle 36 cm 30 cm  Calf 48 cm 45 cm  Thigh 57 cm 56 cm   Treatment: Tubigrip, Compression socks.  Radiographs: Taken reviewed possible slight erosions under the first metatarsal medially Assessment:   1. Capsulitis of metatarsophalangeal (MTP) joint   2. Lymphedema   3. Localized edema   4. Gouty arthritis    Plan:  Patient was evaluated and treated and all questions answered.  Chronic gout -Discussed acute versus chronic medications continue allopurinol per PCP Rx colchicine for acute attacks.  X-ray findings reviewed with patient consistent with possible gouty changes  Lymphedema -Measurements as above.  Would benefit from lymphedema pumps should conservative therapy consisting of compression therapy fail.  Patient given Tubigrip today advised to continue compression socks will come back in 4 weeks for recheck with remeasurement  No follow-ups on file.

## 2018-04-24 ENCOUNTER — Other Ambulatory Visit: Payer: Self-pay

## 2018-04-24 ENCOUNTER — Telehealth: Payer: Self-pay | Admitting: Family Medicine

## 2018-04-24 MED ORDER — HYDROCHLOROTHIAZIDE 25 MG PO TABS: 25.0000 mg | ORAL_TABLET | Freq: Every day | ORAL | 3 refills | Status: DC

## 2018-04-24 MED ORDER — LOSARTAN POTASSIUM 100 MG PO TABS: 100.0000 mg | ORAL_TABLET | Freq: Every day | ORAL | 3 refills | Status: DC

## 2018-04-24 NOTE — Telephone Encounter (Signed)
Rx sent to pharmacy   

## 2018-04-24 NOTE — Telephone Encounter (Signed)
See note

## 2018-04-24 NOTE — Telephone Encounter (Signed)
Copied from [place] [id number]. Topic: Quick Communication - See Telephone Encounter >> Apr 24, 2018  8:49 AM Blase Mess A wrote: CRM for notification. See Telephone encounter for: 04/24/18.  Patient is calling is calling regarding losartan-hydrochlorothiazide (HYZAAR) 100-25 MG tablet [[id number]] because the medication is on back order.  The pharmacy is requesting the medication to be separated. Please [facility] ([address]), [place] [phone] (Phone) [phone] (Fax)

## 2018-05-11 ENCOUNTER — Other Ambulatory Visit: Payer: Self-pay | Admitting: Family Medicine

## 2018-05-11 MED ORDER — ALLOPURINOL 100 MG PO TABS: 100.0000 mg | ORAL_TABLET | Freq: Every day | ORAL | 3 refills | Status: DC

## 2018-05-11 NOTE — Telephone Encounter (Signed)
See note

## 2018-05-11 NOTE — Telephone Encounter (Signed)
Copied from [place] [id number]. Topic: Quick Communication - Rx Refill/Question >> May 11, 2018  2:15 PM Sheran Luz wrote: Medication: allopurinol (ZYLOPRIM) 100 MG tablet  Patient is requesting a refill of this medication. Patient states that he spoke with Dr. Jerline Pain about refilling this medication at last OV. Please advise.   Preferred Pharmacy (with phone number or street name):[facility] ([address]),  - [place] [phone] (Phone) [phone] (Fax)

## 2018-05-11 NOTE — Telephone Encounter (Signed)
Requested medication (s) are due for refill today: yes  Requested medication (s) are on the active medication list: yes   Last refill:  Last filled by a different provider  Future visit scheduled: No  Notes to clinic:  Unable to refill per protocol. Medication last filled by a different provider    Requested Prescriptions  Pending Prescriptions Disp Refills   allopurinol (ZYLOPRIM) 100 MG tablet 90 tablet 3    Sig: Take 1 tablet (100 mg total) by mouth daily.     Endocrinology:  Gout Agents Passed - 05/11/2018  2:45 PM      Passed - Uric Acid in normal range and within 360 days    Uric Acid, Serum  Date Value Ref Range Status  03/23/2018 5.9 4.0 - 7.8 mg/dL Final         Passed - Cr in normal range and within 360 days    Creatinine, Ser  Date Value Ref Range Status  03/23/2018 0.88 0.40 - 1.50 mg/dL Final         Passed - Valid encounter within last 12 months    Recent Outpatient Visits          1 month ago Encounter for general adult medical examination with abnormal findings   [place] [facility], Algis Greenhouse, MD   7 months ago Venous stasis dermatitis of both lower extremities   [place] [facility], Algis Greenhouse, MD   8 months ago Idiopathic gout, unspecified chronicity, unspecified site   [facility], Algis Greenhouse, MD   4 years ago Bacterial sinusitis   [place] Hunter, Stephen O, MD   6 years ago Routine general medical examination at a health care facility   [facility], Hilliard Clark, MD

## 2018-05-18 ENCOUNTER — Other Ambulatory Visit: Payer: Self-pay | Admitting: Endocrinology

## 2018-05-21 ENCOUNTER — Ambulatory Visit (INDEPENDENT_AMBULATORY_CARE_PROVIDER_SITE_OTHER): Payer: 59 | Admitting: Podiatry

## 2018-05-21 DIAGNOSIS — M7751 Other enthesopathy of right foot: Secondary | ICD-10-CM

## 2018-05-21 DIAGNOSIS — M7750 Other enthesopathy of unspecified foot: Secondary | ICD-10-CM

## 2018-05-21 DIAGNOSIS — M722 Plantar fascial fibromatosis: Secondary | ICD-10-CM | POA: Diagnosis not present

## 2018-05-21 DIAGNOSIS — I890 Lymphedema, not elsewhere classified: Secondary | ICD-10-CM

## 2018-05-21 DIAGNOSIS — M109 Gout, unspecified: Secondary | ICD-10-CM

## 2018-05-21 NOTE — Patient Instructions (Signed)

## 2018-05-24 NOTE — Progress Notes (Signed)
Subjective:  Patient ID: Alexis Norris, male    DOB: 1960-08-02,  MRN: [id number]  Chief Complaint  Patient presents with  . capsulitis    F/U BL gout, lymphedema and capsulitsi Pt.states."swelling is getting worse on the Lt, the gout I really dont; have any problem with it; 3/10 soreness." Tx: surgitube (made it worse), and allopurinol -pt states he only took the colchicine for 1 wk   58 y.o. male presents with the above complaint.  Separate complaint of right heel pain.  States that he tried stretching which made it worse.  Review of Systems: Negative except as noted in the HPI. Denies N/V/F/Ch.  Past Medical History:  Diagnosis Date  . DYSLIPIDEMIA 01/13/2007   no medicines   . HYPERTENSION 01/13/2007  . 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  allopurinol (ZYLOPRIM) 100 MG tablet, Take 1 tablet (100 mg total) by mouth daily., Disp: 90 tablet, Rfl: 3 .  clobetasol ointment (TEMOVATE) 5.40 %, APPLY 1 APPLICATION TO AFFECTED AREA TWICE A DAY FOR 14 DAYS THEN ONCE DAILY M F, Disp: 60 g, Rfl: 2 .  colchicine 0.6 MG tablet, Take 1 tablet (0.6 mg total) by mouth daily., Disp: 30 tablet, Rfl: 0 .  hydrochlorothiazide (HYDRODIURIL) 25 MG tablet, Take 1 tablet (25 mg total) by mouth daily., Disp: 90 tablet, Rfl: 3 .  levothyroxine (SYNTHROID, LEVOTHROID) 112 MCG tablet, TAKE 2 TABLETS BY MOUTH  DAILY BEFORE BREAKFAST, Disp: 180 tablet, Rfl: 1 .  losartan (COZAAR) 100 MG tablet, Take 1 tablet (100 mg total) by mouth daily., Disp: 90 tablet, Rfl: 3 .  losartan-hydrochlorothiazide (HYZAAR) 100-25 MG tablet, Take 1 tablet by mouth daily., Disp: 90 tablet, Rfl: 3  Social History   Tobacco Use  Smoking Status Never Smoker  Smokeless Tobacco Never Used    Allergies  Allergen Reactions  . Simvastatin Nausea Only    REACTION: didn't feel well   Objective:   There were no vitals filed for this visit. There is no height or weight on file to  calculate BMI. Constitutional Well developed. Well nourished.  Vascular Dorsalis pedis pulses palpable bilaterally. Posterior tibial pulses palpable bilaterally. Capillary refill normal to all digits.  No cyanosis or clubbing noted. Pedal hair growth normal.  Neurologic Normal speech. Oriented to person, place, and time. Epicritic sensation to light touch grossly present bilaterally.  Dermatologic Nails well groomed and normal in appearance. Brawny edema noted bilaterally with trophic skin changes  Orthopedic: Normal joint ROM without pain or crepitus bilaterally. No visible deformities. No bony tenderness.   Edema Circumference: Date: 12/26 Left Right  Dorsum Foot 29 cm 28 cm  Ankle 36 cm 30 cm  Calf 48 cm 45 cm  Thigh 57 cm 56 cm     Edema Circumference: Date: 1/26 Left Right  Dorsum Foot 28 cm 28 cm  Ankle 33 cm 32 cm  Calf 51 cm 59 cm  Thigh 63 cm 62 cm    Radiographs: Taken reviewed possible slight erosions under the first metatarsal medially Assessment:   1. Capsulitis of metatarsophalangeal (MTP) joint   2. Lymphedema   3. Gouty arthritis   4. Plantar fasciitis    Plan:  Patient was evaluated and treated and all questions answered.  Chronic gout -Not presently causing pain.   Lymphedema -Measurements as above.  No significant improvement noted to the edema.  Would benefit from referral for lymphedema pumps.  Plantar Fasciitis, right - XR reviewed as above.  - Educated on  icing and stretching. Instructions given.  - Injection delivered to the plantar fascia as below.  Procedure: Injection Tendon/Ligament Location: Right plantar fascia at the glabrous junction; medial approach. Skin Prep: Alcohol. Injectate: 1 cc 0.5% marcaine plain, 1 cc dexamethasone phosphate, 0.5 cc kenalog 10. Disposition: Patient tolerated procedure well. Injection site dressed with a band-aid.      Return in about 4 weeks (around 06/18/2018) for Plantar fasciitis, lymphedema  .

## 2018-06-22 ENCOUNTER — Other Ambulatory Visit: Payer: Self-pay | Admitting: Family Medicine

## 2018-06-22 NOTE — Telephone Encounter (Signed)
Requested medication (s) are due for refill today -no- patient should have 1 more refill- but he is changing pharmacy  Requested medication (s) are on the active medication list -yes  Future visit scheduled  -no  Last refill: 05/19/18  Notes to clinic: Patient is requesting Rx at new pharmacy- he has been followed for this by outside provider.  Requested Prescriptions  Pending Prescriptions Disp Refills   levothyroxine (SYNTHROID, LEVOTHROID) 112 MCG tablet 180 tablet 1    Sig: TAKE 2 TABLETS BY MOUTH  DAILY BEFORE BREAKFAST     Endocrinology:  Hypothyroid Agents Failed - 06/22/2018 12:44 PM      Failed - TSH needs to be rechecked within 3 months after an abnormal result. Refill until TSH is due.      Passed - TSH in normal range and within 360 days    TSH  Date Value Ref Range Status  03/23/2018 1.89 0.35 - 4.50 uIU/mL Final         Passed - Valid encounter within last 12 months    Recent Outpatient Visits          3 months ago Encounter for general adult medical examination with abnormal findings   [place] [facility], Algis Greenhouse, MD   8 months ago Venous stasis dermatitis of both lower extremities   [place] [facility], Algis Greenhouse, MD   9 months ago Idiopathic gout, unspecified chronicity, unspecified site   [place] [facility], Algis Greenhouse, MD   4 years ago Bacterial sinusitis   [place] Hunter, Brayton Mars, MD   6 years ago Routine general medical examination at a health care facility   [place] [facility], Hilliard Clark, MD              Requested Prescriptions  Pending Prescriptions Disp Refills   levothyroxine (SYNTHROID, LEVOTHROID) 112 MCG tablet 180 tablet 1    Sig: TAKE 2 TABLETS BY MOUTH  DAILY BEFORE BREAKFAST     Endocrinology:  Hypothyroid Agents Failed - 06/22/2018 12:44 PM      Failed - TSH needs to be rechecked within 3 months after an abnormal  result. Refill until TSH is due.      Passed - TSH in normal range and within 360 days    TSH  Date Value Ref Range Status  03/23/2018 1.89 0.35 - 4.50 uIU/mL Final         Passed - Valid encounter within last 12 months    Recent Outpatient Visits          3 months ago Encounter for general adult medical examination with abnormal findings   [place] Parker, Algis Greenhouse, MD   8 months ago Venous stasis dermatitis of both lower extremities   [place] Parker, Algis Greenhouse, MD   9 months ago Idiopathic gout, unspecified chronicity, unspecified site   [facility], Algis Greenhouse, MD   4 years ago Bacterial sinusitis   [place] Hunter, Stephen O, MD   6 years ago Routine general medical examination at a health care facility   [facility], Hilliard Clark, MD

## 2018-06-22 NOTE — Telephone Encounter (Signed)
Copied from [place] [id number]. Topic: Quick Communication - Rx Refill/Question >> Jun 22, 2018 10:36 AM Sheran Luz wrote: Medication: levothyroxine (SYNTHROID, LEVOTHROID) 112 MCG tablet   Patient is requesting a refill of this medication. Patient states that he has discussed this previously with PCP.   Preferred Pharmacy (with phone number or street name):[facility] ([address]), [place] - [place]  [phone] (Phone) [phone] (Fax)

## 2018-06-24 MED ORDER — LEVOTHYROXINE SODIUM 112 MCG PO TABS: ORAL_TABLET | ORAL | 1 refills | Status: DC

## 2018-06-24 NOTE — Telephone Encounter (Signed)
See note

## 2018-06-25 ENCOUNTER — Encounter: Payer: Self-pay | Admitting: Podiatry

## 2018-06-25 ENCOUNTER — Ambulatory Visit (INDEPENDENT_AMBULATORY_CARE_PROVIDER_SITE_OTHER): Payer: 59 | Admitting: Podiatry

## 2018-06-25 DIAGNOSIS — I890 Lymphedema, not elsewhere classified: Secondary | ICD-10-CM | POA: Diagnosis not present

## 2018-06-25 NOTE — Progress Notes (Signed)
  Subjective:  Patient ID: Alexis Norris, male    DOB: Jun 28, 1960,  MRN: [id number]  Chief Complaint  Patient presents with  . Plantar Fasciitis    Follow up right heel   "Its better"  . Foot Swelling    Follow up lymphedema    58 y.o. male presents with the above complaint.  Separate complaint of right heel pain.  States that he tried stretching which made it worse.  Review of Systems: Negative except as noted in the HPI. Denies N/V/F/Ch.  Past Medical History:  Diagnosis Date  . DYSLIPIDEMIA 01/13/2007   no medicines   . HYPERTENSION 01/13/2007  . 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  allopurinol (ZYLOPRIM) 100 MG tablet, Take 1 tablet (100 mg total) by mouth daily., Disp: 90 tablet, Rfl: 3 .  clobetasol ointment (TEMOVATE) 7.32 %, APPLY 1 APPLICATION TO AFFECTED AREA TWICE A DAY FOR 14 DAYS THEN ONCE DAILY M F, Disp: 60 g, Rfl: 2 .  colchicine 0.6 MG tablet, Take 1 tablet (0.6 mg total) by mouth daily., Disp: 30 tablet, Rfl: 0 .  hydrochlorothiazide (HYDRODIURIL) 25 MG tablet, Take 1 tablet (25 mg total) by mouth daily., Disp: 90 tablet, Rfl: 3 .  levothyroxine (SYNTHROID, LEVOTHROID) 112 MCG tablet, TAKE 2 TABLETS BY MOUTH  DAILY BEFORE BREAKFAST, Disp: 180 tablet, Rfl: 1 .  losartan (COZAAR) 100 MG tablet, Take 1 tablet (100 mg total) by mouth daily., Disp: 90 tablet, Rfl: 3 .  losartan-hydrochlorothiazide (HYZAAR) 100-25 MG tablet, Take 1 tablet by mouth daily., Disp: 90 tablet, Rfl: 3  Social History   Tobacco Use  Smoking Status Never Smoker  Smokeless Tobacco Never Used    Allergies  Allergen Reactions  . Simvastatin Nausea Only    REACTION: didn't feel well   Objective:   There were no vitals filed for this visit. There is no height or weight on file to calculate BMI. Constitutional Well developed. Well nourished.  Vascular Dorsalis pedis pulses palpable bilaterally. Posterior tibial pulses palpable  bilaterally. Capillary refill normal to all digits.  No cyanosis or clubbing noted. Pedal hair growth normal.  Neurologic Normal speech. Oriented to person, place, and time. Epicritic sensation to light touch grossly present bilaterally.  Dermatologic Nails well groomed and normal in appearance. Brawny edema noted bilaterally with trophic skin changes  Orthopedic: Normal joint ROM without pain or crepitus bilaterally. No visible deformities. No bony tenderness.   Edema Circumference: Date: 12/26 Left Right  Dorsum Foot 29 cm 28 cm  Ankle 36 cm 30 cm  Calf 48 cm 45 cm  Thigh 57 cm 56 cm     Edema Circumference: Date: 1/26 Left Right  Dorsum Foot 28 cm 28 cm  Ankle 33 cm 32 cm  Calf 51 cm 59 cm  Thigh 63 cm 62 cm    Radiographs: None Assessment:   1. Lymphedema    Plan:  Patient was evaluated and treated and all questions answered.  Lymphedema -Measurements as above.  No significant improvement noted to the edema. Referral for lymphedema pumps.   No follow-ups on file.

## 2018-06-25 NOTE — Patient Instructions (Signed)
Recommended Lotions:  [place] Bond Diabetic Eucerin Cream AmLactin Lotion Aveeno

## 2018-06-26 ENCOUNTER — Telehealth: Payer: Self-pay | Admitting: *Deleted

## 2018-06-26 NOTE — Telephone Encounter (Signed)
Order, available clinicals prepared with demographics to be faxed to [facility].

## 2018-06-29 NOTE — Telephone Encounter (Signed)
Done

## 2018-06-29 NOTE — Telephone Encounter (Signed)
Faxed rx, clinicals and 06/25/2018 received clinicals to [facility].

## 2018-07-01 ENCOUNTER — Telehealth: Payer: Self-pay | Admitting: Podiatry

## 2018-07-01 NOTE — Telephone Encounter (Signed)
I was supposed to be contacted by a company for lymphoedema pumps and its been a week and I still have not heard anything.

## 2018-07-01 NOTE — Telephone Encounter (Signed)
I spoke to [employer], he states the referral and clinicals have been received and they are verifying his insurance coverage and they will contact pt in 1-2 days.

## 2018-07-01 NOTE — Telephone Encounter (Signed)
I informed pt of [employer] - Chris statement.

## 2018-07-15 ENCOUNTER — Telehealth: Payer: Self-pay | Admitting: Podiatry

## 2018-07-15 NOTE — Telephone Encounter (Signed)
Alexis Norris with [employer]. We had faxed over forms on 16 March for the doctor to review and sign. Please call me at [phone].

## 2018-07-15 NOTE — Telephone Encounter (Signed)
Left message requesting the [employer] forms be faxed to [phone].

## 2018-07-15 NOTE — Telephone Encounter (Signed)
Faxed [employer] forms x 2 to [facility] and sent to scan.

## 2018-10-13 ENCOUNTER — Telehealth: Payer: Self-pay | Admitting: Family Medicine

## 2018-10-13 NOTE — Telephone Encounter (Signed)
Copied from [place] [phone]. Topic: Quick Communication - Rx Refill/Question >> Oct 13, 2018 10:49 AM Gustavus Messing wrote: Reason for CRM: The patient needs for Dr. Jerline Pain to Re do his prescription and send it to [phone]. Also the patient wants a 90 day supply instead of 30 day

## 2018-10-13 NOTE — Telephone Encounter (Signed)
LM for patient to return call.  Unsure what Rx patient is referring to.  We have not sent in an Rx for this patient recently and all prescriptions that have been sent in have been for 90-day supplies.  Please get this information if patient calls back.  CRM placed.

## 2018-10-14 ENCOUNTER — Other Ambulatory Visit: Payer: Self-pay

## 2018-10-14 MED ORDER — ALLOPURINOL 100 MG PO TABS: 100.0000 mg | ORAL_TABLET | Freq: Every day | ORAL | 3 refills | Status: DC

## 2018-10-14 MED ORDER — LOSARTAN POTASSIUM 100 MG PO TABS: 100.0000 mg | ORAL_TABLET | Freq: Every day | ORAL | 3 refills | Status: DC

## 2018-10-14 MED ORDER — LOSARTAN POTASSIUM-HCTZ 100-25 MG PO TABS: 1.0000 | ORAL_TABLET | Freq: Every day | ORAL | 3 refills | Status: DC

## 2018-10-14 MED ORDER — HYDROCHLOROTHIAZIDE 25 MG PO TABS: 25.0000 mg | ORAL_TABLET | Freq: Every day | ORAL | 3 refills | Status: DC

## 2018-10-14 MED ORDER — LEVOTHYROXINE SODIUM 112 MCG PO TABS: ORAL_TABLET | ORAL | 1 refills | Status: DC

## 2018-10-14 NOTE — Telephone Encounter (Signed)
Received new Rx request and have sent Rx to pharmacy.

## 2018-10-20 NOTE — Telephone Encounter (Signed)
Lisabeth Pick, from [facility], called stating they received to refill requests for two different types of losartan. She is requesting clarification. Reference number for pt [id number] Callback # [phone]

## 2018-10-20 NOTE — Telephone Encounter (Signed)
See note

## 2018-10-21 ENCOUNTER — Telehealth: Payer: Self-pay

## 2018-10-21 NOTE — Telephone Encounter (Signed)
Done

## 2018-10-21 NOTE — Telephone Encounter (Signed)
Copied from [place] [id number]. Topic: General - Other >> Oct 21, 2018 10:39 AM Yvette Rack wrote: Reason for CRM: Jamas Lav with [facility] asked if Dr. Jerline Pain would like to cancel the Rx for the losartan (COZAAR) 100 MG tablet and hydrochlorothiazide (HYDRODIURIL) 25 MG tablet since they now have the combination medication losartan-hydrochlorothiazide (HYZAAR) 100-25 MG tablet. Cb# [phone] Option# 2 Reference# [id number]

## 2018-11-09 ENCOUNTER — Other Ambulatory Visit: Payer: Self-pay

## 2018-11-09 ENCOUNTER — Encounter: Payer: Self-pay | Admitting: Family Medicine

## 2018-11-09 ENCOUNTER — Ambulatory Visit: Payer: Self-pay | Admitting: Family Medicine

## 2018-11-09 ENCOUNTER — Ambulatory Visit (INDEPENDENT_AMBULATORY_CARE_PROVIDER_SITE_OTHER): Payer: 59 | Admitting: Family Medicine

## 2018-11-09 DIAGNOSIS — D1721 Benign lipomatous neoplasm of skin and subcutaneous tissue of right arm: Secondary | ICD-10-CM

## 2018-11-09 DIAGNOSIS — Z20828 Contact with and (suspected) exposure to other viral communicable diseases: Secondary | ICD-10-CM

## 2018-11-09 DIAGNOSIS — Z209 Contact with and (suspected) exposure to unspecified communicable disease: Secondary | ICD-10-CM

## 2018-11-09 NOTE — Telephone Encounter (Signed)
I've been exposed to COVID-19 from my wife.  She does not know her test results yet but her dr thinks she may have the COVID-19 because she is having symptoms.     My job wants me to be tested prior to coming back to work.   I'm not having symptoms of COVID-19.    I transferred his call into the [facility] office, he is a pt of Dr. Jerline Pain.   [place]ison took the call to get him scheduled.     Reason for Disposition . [1] COVID-19 EXPOSURE (Close Contact) within last 14 days AND [2] needs COVID-19 lab test to return to work AND [3] NO symptoms  Answer Assessment - Initial Assessment Questions 1. CLOSE CONTACT: "Who is the person with the confirmed or suspected COVID-19 infection that you were exposed to?"     My wife 2. PLACE of CONTACT: "Where were you when you were exposed to COVID-19?" (e.g., home, school, medical waiting room; which city?)     Home 3. TYPE of CONTACT: "How much contact was there?" (e.g., sitting next to, live in same house, work in same office, same building)     My wife 4. DURATION of CONTACT: "How long were you in contact with the COVID-19 patient?" (e.g., a few seconds, passed by person, a few minutes, live with the patient)     Since 11/06/2018 because she was out of town. 5. DATE of CONTACT: "When did you have contact with a COVID-19 patient?" (e.g., how many days ago)     Since 11/06/2018 6. TRAVEL: "Have you traveled out of the country recently?" If so, "When and where?"     * Also ask about out-of-state travel, since the CDC has identified some high-risk cities for community spread in the [place].     * Note: Travel becomes less relevant if there is widespread community transmission where the patient lives.     Wide spread 7. COMMUNITY SPREAD: "Are there lots of cases of COVID-19 (community spread) where you live?" (See public health department website, if unsure)       Wide spread in community 8. SYMPTOMS: "Do you have any symptoms?" (e.g., fever, cough, breathing  difficulty)     No except stuffy nose which I always have due to dog hair. 9. PREGNANCY OR POSTPARTUM: "Is there any chance you are pregnant?" "When was your last menstrual period?" "Did you deliver in the last 2 weeks?"     N/A 10. HIGH RISK: "Do you have any heart or lung problems? Do you have a weak immune system?" (e.g., CHF, COPD, asthma, HIV positive, chemotherapy, renal failure, diabetes mellitus, sickle cell anemia)       Job requiring I be tested since exposed to my wife who possibly has COVID-19.  Waiting for her test results.  Protocols used: CORONAVIRUS (COVID-19) EXPOSURE-A-AH

## 2018-11-09 NOTE — Progress Notes (Signed)
Subjective:    Patient ID: Alexis Norris, male    DOB: 06-04-1960, 58 y.o.   MRN: [id number]  HPI Virtual Visit via Video Note  I connected with the patient on 11/09/18 at  1:15 PM EDT by a video enabled telemedicine application and verified that I am speaking with the correct person using two identifiers.  Location patient: home Location provider:work or home office Persons participating in the virtual visit: patient, provider  I discussed the limitations of evaluation and management by telemedicine and the availability of in person appointments. The patient expressed understanding and agreed to proceed.   HPI: Here for 2 issues. First he asks about testing for the Covid-19 virus. His wife stayed with her family at the beach from 10-30-18 until she got home 11-06-18. During this time Alexis Norris stayed at home. While she was gone she developed a ST and a low grade fever. She went to an urgent care and they tested her for the Covid virus. They are expecting this result to come back tomorrow. So far Alexis Norris has no symptoms. He asks if he should also be tested now or wait to see how her results come back. He has worked out at home for the past few weeks. Second a lump came up on the right forearm a few weeks ago and he asks me to check it. This does not bother him at all. No recent trauma.    ROS: See pertinent positives and negatives per HPI.  Past Medical History:  Diagnosis Date  . DYSLIPIDEMIA 01/13/2007   no medicines   . HYPERTENSION 01/13/2007  . 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 I-131 therapy  09/1997   maybe 2000-2001  . INGUINAL HERNIA REPAIR Right 09/28/2013   Procedure: LAPAROSCOPIC RIGHT  INGUINAL HERNIA;  Surgeon: Ralene Ok, MD;  Location: [place];  Service: General;  Laterality: Right;  . INSERTION OF MESH Right 09/28/2013   Procedure: INSERTION OF MESH;  Surgeon: Ralene Ok, MD;  Location: [place];  Service:  General;  Laterality: Right;  . right knuckle     pin placed in high school    Family History  Problem Relation Age of Onset  . Thyroid cancer Sister   . Heart attack Father   . Cancer Mother   . Colon cancer Neg Hx   . Colon polyps Neg Hx      Current Outpatient Medications:  .  allopurinol (ZYLOPRIM) 100 MG tablet, Take 1 tablet (100 mg total) by mouth daily., Disp: 90 tablet, Rfl: 3 .  clobetasol ointment (TEMOVATE) 2.54 %, APPLY 1 APPLICATION TO AFFECTED AREA TWICE A DAY FOR 14 DAYS THEN ONCE DAILY M F, Disp: 60 g, Rfl: 2 .  colchicine 0.6 MG tablet, Take 1 tablet (0.6 mg total) by mouth daily., Disp: 30 tablet, Rfl: 0 .  hydrochlorothiazide (HYDRODIURIL) 25 MG tablet, Take 1 tablet (25 mg total) by mouth daily., Disp: 90 tablet, Rfl: 3 .  levothyroxine (SYNTHROID) 112 MCG tablet, TAKE 2 TABLETS BY MOUTH  DAILY BEFORE BREAKFAST, Disp: 180 tablet, Rfl: 1 .  losartan (COZAAR) 100 MG tablet, Take 1 tablet (100 mg total) by mouth daily., Disp: 90 tablet, Rfl: 3 .  losartan-hydrochlorothiazide (HYZAAR) 100-25 MG tablet, Take 1 tablet by mouth daily., Disp: 90 tablet, Rfl: 3  EXAM: SKIN: there is a small lump under the skin on the right forearm, no erythema  VITALS per patient if applicable:  GENERAL: alert, oriented, appears  well and in no acute distress  HEENT: atraumatic, conjunttiva clear, no obvious abnormalities on inspection of external nose and ears  NECK: normal movements of the head and neck  LUNGS: on inspection no signs of respiratory distress, breathing rate appears normal, no obvious gross SOB, gasping or wheezing  CV: no obvious cyanosis  MS: moves all visible extremities without noticeable abnormality  PSYCH/NEURO: pleasant and cooperative, no obvious depression or anxiety, speech and thought processing grossly intact  ASSESSMENT AND PLAN: Possible exposure to the Covid virus. I recommended he wait to see how his wife's results turn out. If she is negative, he  can go back to work. If she is positive, then he should also be tested. As for the lump on his arm, it appears to be a lipoma. He can ask someone to check it the next time he comes in for an OV.  Alysia Penna, MD  Discussed the following assessment and plan:  No diagnosis found.     I discussed the assessment and treatment plan with the patient. The patient was provided an opportunity to ask questions and all were answered. The patient agreed with the plan and demonstrated an understanding of the instructions.   The patient was advised to call back or seek an in-person evaluation if the symptoms worsen or if the condition fails to improve as anticipated.     Review of Systems     Objective:   Physical Exam        Assessment & Plan:

## 2019-03-29 ENCOUNTER — Other Ambulatory Visit (HOSPITAL_COMMUNITY)
Admission: RE | Admit: 2019-03-29 | Discharge: 2019-03-29 | Disposition: A | Discharge status: Home / Self Care | Payer: 59 | Source: Ambulatory Visit | Attending: Family Medicine | Admitting: Family Medicine

## 2019-03-29 ENCOUNTER — Encounter: Payer: Self-pay | Admitting: Family Medicine

## 2019-03-29 ENCOUNTER — Ambulatory Visit (INDEPENDENT_AMBULATORY_CARE_PROVIDER_SITE_OTHER): Payer: 59 | Admitting: Family Medicine

## 2019-03-29 VITALS — BP 138/72 | HR 80 | Temp 98.3°F | Ht 76.0 in | Wt 278.8 lb

## 2019-03-29 DIAGNOSIS — I10 Essential (primary) hypertension: Secondary | ICD-10-CM

## 2019-03-29 DIAGNOSIS — R229 Localized swelling, mass and lump, unspecified: Secondary | ICD-10-CM

## 2019-03-29 DIAGNOSIS — Z0001 Encounter for general adult medical examination with abnormal findings: Secondary | ICD-10-CM | POA: Diagnosis not present

## 2019-03-29 DIAGNOSIS — E785 Hyperlipidemia, unspecified: Secondary | ICD-10-CM

## 2019-03-29 DIAGNOSIS — M109 Gout, unspecified: Secondary | ICD-10-CM | POA: Diagnosis not present

## 2019-03-29 DIAGNOSIS — Z125 Encounter for screening for malignant neoplasm of prostate: Secondary | ICD-10-CM | POA: Diagnosis not present

## 2019-03-29 DIAGNOSIS — R600 Localized edema: Secondary | ICD-10-CM

## 2019-03-29 DIAGNOSIS — I872 Venous insufficiency (chronic) (peripheral): Secondary | ICD-10-CM

## 2019-03-29 DIAGNOSIS — R739 Hyperglycemia, unspecified: Secondary | ICD-10-CM

## 2019-03-29 LAB — TSH: TSH: 4.15 u[IU]/mL (ref 0.35–4.50)

## 2019-03-29 LAB — LIPID PANEL
Cholesterol: 165 mg/dL (ref 0–200)
HDL: 45 mg/dL (ref >39.00)
LDL Cholesterol: 105 mg/dL — ABNORMAL HIGH (ref 0–99)
NonHDL: 119.81
Total CHOL/HDL Ratio: 4
Triglycerides: 72 mg/dL (ref 0.0–149.0)
VLDL: 14.4 mg/dL (ref 0.0–40.0)

## 2019-03-29 LAB — COMPREHENSIVE METABOLIC PANEL
ALT: 10 U/L (ref 0–53)
AST: 11 U/L (ref 0–37)
Albumin: 4.1 g/dL (ref 3.5–5.2)
Alkaline Phosphatase: 58 U/L (ref 39–117)
BUN: 12 mg/dL (ref 6–23)
CO2: 29 mEq/L (ref 19–32)
Calcium: 9.1 mg/dL (ref 8.4–10.5)
Chloride: 102 mEq/L (ref 96–112)
Creatinine, Ser: 0.88 mg/dL (ref 0.40–1.50)
GFR: 89.55 mL/min (ref >60.00)
Glucose, Bld: 94 mg/dL (ref 70–99)
Potassium: 3.7 mEq/L (ref 3.5–5.1)
Sodium: 140 mEq/L (ref 135–145)
Total Bilirubin: 0.8 mg/dL (ref 0.2–1.2)
Total Protein: 6.9 g/dL (ref 6.0–8.3)

## 2019-03-29 LAB — HEMOGLOBIN A1C: Hgb A1c MFr Bld: 4.8 % (ref 4.6–6.5)

## 2019-03-29 LAB — CBC
HCT: 41.7 % (ref 39.0–52.0)
Hemoglobin: 13.9 g/dL (ref 13.0–17.0)
MCHC: 33.4 g/dL (ref 30.0–36.0)
MCV: 84.4 fl (ref 78.0–100.0)
Platelets: 271 10*3/uL (ref 150.0–400.0)
RBC: 4.94 Mil/uL (ref 4.22–5.81)
RDW: 14.5 % (ref 11.5–15.5)
WBC: 6.4 10*3/uL (ref 4.0–10.5)

## 2019-03-29 LAB — PSA: PSA: 0.73 ng/mL (ref 0.10–4.00)

## 2019-03-29 LAB — URIC ACID: Uric Acid, Serum: 6.2 mg/dL (ref 4.0–7.8)

## 2019-03-29 MED ORDER — LEVOTHYROXINE SODIUM 112 MCG PO TABS: ORAL_TABLET | ORAL | 3 refills | Status: DC

## 2019-03-29 MED ORDER — COLCHICINE 0.6 MG PO CAPS: ORAL_CAPSULE | ORAL | 0 refills | Status: DC

## 2019-03-29 MED ORDER — LOSARTAN POTASSIUM-HCTZ 100-25 MG PO TABS: 1.0000 | ORAL_TABLET | Freq: Every day | ORAL | 3 refills | Status: DC

## 2019-03-29 MED ORDER — ALLOPURINOL 100 MG PO TABS: 100.0000 mg | ORAL_TABLET | Freq: Every day | ORAL | 3 refills | Status: DC

## 2019-03-29 NOTE — Assessment & Plan Note (Signed)
At goal.  Continue losartan-HCTZ 100-25 once daily.  

## 2019-03-29 NOTE — Assessment & Plan Note (Signed)
See above.  Dermatitis is worsening.  Please referral to wound care.

## 2019-03-29 NOTE — Assessment & Plan Note (Signed)
Has failed conservative management.  Will place referral to wound care given significant stasis dermatitis.

## 2019-03-29 NOTE — Patient Instructions (Signed)
It was very nice to see you today!  I will refer you to see a wound care specialist for your legs.  We will let you know the results of your biopsy once it comes back later this week.  I will refill your medications and we will check blood work today.  Come back in 1 year for your next physical, or sooner if needed.    Take care, Dr Jerline Pain  Please try these tips to maintain a healthy lifestyle:   Eat at least 3 REAL meals and 1-2 snacks per day.  Aim for no more than 5 hours between eating.  If you eat breakfast, please do so within one hour of getting up.    Obtain twice as many fruits/vegetables as protein or carbohydrate foods for both lunch and dinner. (Half of each meal should be fruits/vegetables, one quarter protein, and one quarter starchy carbs)   Cut down on sweet beverages. This includes juice, soda, and sweet tea.    Exercise at least 150 minutes every week.    Preventive Care 4-11 Years Old, Male Preventive care refers to lifestyle choices and visits with your health care provider that can promote health and wellness. This includes:  A yearly physical exam. This is also called an annual well check.  Regular dental and eye exams.  Immunizations.  Screening for certain conditions.  Healthy lifestyle choices, such as eating a healthy diet, getting regular exercise, not using drugs or products that contain nicotine and tobacco, and limiting alcohol use. What can I expect for my preventive care visit? Physical exam Your health care provider will check:  Height and weight. These may be used to calculate body mass index (BMI), which is a measurement that tells if you are at a healthy weight.  Heart rate and blood pressure.  Your skin for abnormal spots. Counseling Your health care provider may ask you questions about:  Alcohol, tobacco, and drug use.  Emotional well-being.  Home and relationship well-being.  Sexual activity.  Eating habits.  Work  and work Statistician. What immunizations do I need?  Influenza (flu) vaccine  This is recommended every year. Tetanus, diphtheria, and pertussis (Tdap) vaccine  You may need a Td booster every 10 years. Varicella (chickenpox) vaccine  You may need this vaccine if you have not already been vaccinated. Zoster (shingles) vaccine  You may need this after age 7. Measles, mumps, and rubella (MMR) vaccine  You may need at least one dose of MMR if you were born in 1957 or later. You may also need a second dose. Pneumococcal conjugate (PCV13) vaccine  You may need this if you have certain conditions and were not previously vaccinated. Pneumococcal polysaccharide (PPSV23) vaccine  You may need one or two doses if you smoke cigarettes or if you have certain conditions. Meningococcal conjugate (MenACWY) vaccine  You may need this if you have certain conditions. Hepatitis A vaccine  You may need this if you have certain conditions or if you travel or work in places where you may be exposed to hepatitis A. Hepatitis B vaccine  You may need this if you have certain conditions or if you travel or work in places where you may be exposed to hepatitis B. Haemophilus influenzae type b (Hib) vaccine  You may need this if you have certain risk factors. Human papillomavirus (HPV) vaccine  If recommended by your health care provider, you may need three doses over 6 months. You may receive vaccines as individual doses or  as more than one vaccine together in one shot (combination vaccines). Talk with your health care provider about the risks and benefits of combination vaccines. What tests do I need? Blood tests  Lipid and cholesterol levels. These may be checked every 5 years, or more frequently if you are over 87 years old.  Hepatitis C test.  Hepatitis B test. Screening  Lung cancer screening. You may have this screening every year starting at age 58 if you have a 30-pack-year history of  smoking and currently smoke or have quit within the past 15 years.  Prostate cancer screening. Recommendations will vary depending on your family history and other risks.  Colorectal cancer screening. All adults should have this screening starting at age 53 and continuing until age 71. Your health care provider may recommend screening at age 65 if you are at increased risk. You will have tests every 1-10 years, depending on your results and the type of screening test.  Diabetes screening. This is done by checking your blood sugar (glucose) after you have not eaten for a while (fasting). You may have this done every 1-3 years.  Sexually transmitted disease (STD) testing. Follow these instructions at home: Eating and drinking  Eat a diet that includes fresh fruits and vegetables, whole grains, lean protein, and low-fat dairy products.  Take vitamin and mineral supplements as recommended by your health care provider.  Do not drink alcohol if your health care provider tells you not to drink.  If you drink alcohol: ? Limit how much you have to 0-2 drinks a day. ? Be aware of how much alcohol is in your drink. In the [place] one drink equals one 12 oz bottle of beer (355 mL), one 5 oz glass of wine (148 mL), or one 1 oz glass of hard liquor (44 mL). Lifestyle  Take daily care of your teeth and gums.  Stay active. Exercise for at least 30 minutes on 5 or more days each week.  Do not use any products that contain nicotine or tobacco, such as cigarettes, e-cigarettes, and chewing tobacco. If you need help quitting, ask your health care provider.  If you are sexually active, practice safe sex. Use a condom or other form of protection to prevent STIs (sexually transmitted infections).  Talk with your health care provider about taking a low-dose aspirin every day starting at age 23. What's next?  Go to your health care provider once a year for a well check visit.  Ask your health care provider  how often you should have your eyes and teeth checked.  Stay up to date on all vaccines. This information is not intended to replace advice given to you by your health care provider. Make sure you discuss any questions you have with your health care provider. Document Released: 05/12/2015 Document Revised: 04/09/2018 Document Reviewed: 04/09/2018 Elsevier Patient Education  2020 [employer].

## 2019-03-29 NOTE — Assessment & Plan Note (Signed)
Check lipid panel  

## 2019-03-29 NOTE — Progress Notes (Signed)
Chief Complaint:  Alexis Norris is a 58 y.o. male who presents today for his annual comprehensive physical exam.    Assessment/Plan:  Gout Continue allopurinol 100 mg daily.  Continue colchicine as needed.  Check uric acid level today.  Essential hypertension At goal.  Continue losartan-HCTZ 100-25 once daily  Dyslipidemia Check lipid panel.  Lower extremity edema Has failed conservative management.  Will place referral to wound care given significant stasis dermatitis.  Stasis dermatitis See above.  Dermatitis is worsening.  Please referral to wound care.  Skin Nodule Biopsy performed today.  See below procedure note.  Can use over-the-counter analgesics as needed.  Body mass index is 33.94 kg/m. / Obese BMI Metric Follow Up - 03/29/19 0932      BMI Metric Follow Up-Please document annually   BMI Metric Follow Up  Education provided        Preventative Healthcare: Flu shot declined.  Check CBC, C met, TSH, A1c, lipid panel, and PSA.  Patient Counseling(The following topics were reviewed and/or handout was given):  -Nutrition: Stressed importance of moderation in sodium/caffeine intake, saturated fat and cholesterol, caloric balance, sufficient intake of fresh fruits, vegetables, and fiber.  -Stressed the importance of regular exercise.   -Substance Abuse: Discussed cessation/primary prevention of tobacco, alcohol, or other drug use; driving or other dangerous activities under the influence; availability of treatment for abuse.   -Injury prevention: Discussed safety belts, safety helmets, smoke detector, smoking near bedding or upholstery.   -Sexuality: Discussed sexually transmitted diseases, partner selection, use of condoms, avoidance of unintended pregnancy and contraceptive alternatives.   -Dental health: Discussed importance of regular tooth brushing, flossing, and dental visits.  -Health maintenance and immunizations reviewed. Please refer to Health maintenance  section.  Return to care in 1 year for next preventative visit.     Subjective:  HPI:  He has had a lump on his right forearm.  Has been there for about 8 months.  Stable in size.  No pain.  His stable, chronic medical conditions are outlined below:   # Gout - On allopurinol '100mg'$  daily - Uses colchicine as needed for flares  # Hypertension - On Hyzaar 100-25 and tolerating well - ROS: No reported chest pain or shortness of breath  # Hypothyroidism - On levothyroxine 223mg daily and tolerating well.   # Stasis Dermatitis - Uses clobetasol as needed - Seems to be worsening over the past few months - Avoid salt, has tried compression stockings without improvement  Lifestyle Diet: Balanced. Trying to avoid salt.  Exercise: Busy with work. No specific exercises.   Depression screen PHQ 2/9 03/29/2019  Decreased Interest 0  Down, Depressed, Hopeless 0  PHQ - 2 Score 0    Health Maintenance Due  Topic Date Due  . HIV Screening  02/18/1978  . TETANUS/TDAP  06/27/2012     ROS: Per HPI, otherwise a complete review of systems was negative.   PMH:  The following were reviewed and entered/updated in epic: Past Medical History:  Diagnosis Date  . DYSLIPIDEMIA 01/13/2007   no medicines   . HYPERTENSION 01/13/2007  . 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 Tinea pedis of both feet 03/23/2018  . Stasis dermatitis 08/29/2017  . Lower extremity edema 08/29/2017  . Gout 02/06/2016  . Rt inguinal pain 01/20/2013  . Knee pain 12/15/2012  . Allergic rhinitis, cause unspecified 08/26/2011  . 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 Dyslipidemia 01/13/2007  .  Essential hypertension 01/13/2007   Past Surgical History:  Procedure Laterality Date  . I-131 therapy  09/1997   maybe 2000-2001  . INGUINAL HERNIA REPAIR Right 09/28/2013   Procedure: LAPAROSCOPIC RIGHT  INGUINAL HERNIA;  Surgeon: Ralene Ok, MD;  Location: [facility];  Service: General;  Laterality: Right;  . INSERTION OF MESH Right 09/28/2013   Procedure: INSERTION OF MESH;  Surgeon: Ralene Ok, MD;  Location: [facility];  Service: General;  Laterality: Right;  . right knuckle     pin placed in high school    Family History  Problem Relation Age of Onset  . Thyroid cancer Sister   . Heart attack Father   . Cancer Mother   . Colon cancer Neg Hx   . Colon polyps Neg Hx     Medications- reviewed and updated Current Outpatient Medications  Medication Sig Dispense Refill  . allopurinol (ZYLOPRIM) 100 MG tablet Take 1 tablet (100 mg total) by mouth daily. 90 tablet 3  . clobetasol ointment (TEMOVATE) 0.25 % APPLY 1 APPLICATION TO AFFECTED AREA TWICE A DAY FOR 14 DAYS THEN ONCE DAILY M F 60 g 2  . levothyroxine (SYNTHROID) 112 MCG tablet TAKE 2 TABLETS BY MOUTH  DAILY BEFORE BREAKFAST 180 tablet 3  . losartan-hydrochlorothiazide (HYZAAR) 100-25 MG tablet Take 1 tablet by mouth daily. 90 tablet 3  . Colchicine 0.6 MG CAPS Day 1: Take 2 caps, then 1 cap an hour later. Day 2 and beyond: 1 cap daily 30 capsule 0   No current facility-administered medications for this visit.     Allergies-reviewed and updated Allergies  Allergen Reactions  . Simvastatin Nausea Only    REACTION: didn't feel well    Social History   Socioeconomic History  . Marital status: Married    Spouse name: Not on file  . Number of children: Not on file  . Years of education: Not on file  . Highest education level: Not on file  Occupational History  . Occupation: Designer, fashion/clothing  . Financial resource strain: Not on file  . Food insecurity    Worry: Not on file    Inability: Not on file  . Transportation needs    Medical: Not on file    Non-medical: Not on file  Tobacco Use  . Smoking status: Never Smoker  . Smokeless tobacco: Never Used  Substance and Sexual Activity  . Alcohol use: Yes    Alcohol/week: 0.0 standard drinks     Comment: infrequently  . Drug use: No  . Sexual activity: Not on file  Lifestyle  . Physical activity    Days per week: Not on file    Minutes per session: Not on file  . Stress: Not on file  Relationships  . Social Herbalist on phone: Not on file    Gets together: Not on file    Attends religious service: Not on file    Active member of club or organization: Not on file    Attends meetings of clubs or organizations: Not on file    Relationship status: Not on file  Other Topics Concern  . Not on file  Social History Narrative   Works Marine scientist        Objective:  Physical Exam: BP 138/72   Pulse 80   Temp 98.3 F (36.8 C)   Ht '6\' 4"'$  (1.93 m)   Wt 278 lb 12.8 oz (126.5 kg)   SpO2 99%  BMI 33.94 kg/m   Body mass index is 33.94 kg/m. Wt Readings from Last 3 Encounters:  03/29/19 278 lb 12.8 oz (126.5 kg)  03/23/18 278 lb 12.8 oz (126.5 kg)  10/03/17 283 lb 12.8 oz (128.7 kg)   Gen: NAD, resting comfortably HEENT: TMs normal bilaterally. OP clear. No thyromegaly noted.  CV: RRR with no murmurs appreciated Pulm: NWOB, CTAB with no crackles, wheezes, or rhonchi GI: Normal bowel sounds present. Soft, Nontender, Nondistended. MSK: no edema, cyanosis, or clubbing noted Skin: warm, dry.  Approximately 3 cm nodule on right dorsal forearm.  Feels stuck to the superficial layer.  Not mobile.  No areas of fluctuance.  Super [place]hel erythema noted. Neuro: CN2-12 grossly intact. Strength 5/5 in upper and lower extremities. Reflexes symmetric and intact bilaterally.  Psych: Normal affect and thought content  Punch Biopsy Procedure Note After informed written consent was obtained, using Betadine for cleansing and 1% Lidocaine with epinephrine for anesthetic, with sterile technique a 4 mm punch biopsy was used to obtain a biopsy specimen of the lesion. Hemostasis was obtained by pressure and wound was not sutured. Antibiotic dressing is applied, and wound care  instructions provided. Be alert for any signs of cutaneous infection. The specimen is labeled and sent to pathology for evaluation. The procedure was well tolerated without complications.  Can use OTC analgesics if needed.       Algis Greenhouse. Jerline Pain, MD 03/29/2019 9:35 AM

## 2019-03-29 NOTE — Assessment & Plan Note (Signed)
Continue allopurinol 100 mg daily.  Continue colchicine as needed.  Check uric acid level today.

## 2019-03-31 ENCOUNTER — Other Ambulatory Visit: Payer: Self-pay | Admitting: Family Medicine

## 2019-04-02 ENCOUNTER — Other Ambulatory Visit: Payer: Self-pay

## 2019-04-02 DIAGNOSIS — R229 Localized swelling, mass and lump, unspecified: Secondary | ICD-10-CM

## 2019-04-02 LAB — SURGICAL PATHOLOGY

## 2019-04-02 MED ORDER — ALLOPURINOL 100 MG PO TABS: 100.0000 mg | ORAL_TABLET | Freq: Every day | ORAL | 3 refills | Status: DC

## 2019-04-02 MED ORDER — LEVOTHYROXINE SODIUM 112 MCG PO TABS: ORAL_TABLET | ORAL | 2 refills | Status: DC

## 2019-04-02 MED ORDER — LOSARTAN POTASSIUM-HCTZ 100-25 MG PO TABS: 1.0000 | ORAL_TABLET | Freq: Every day | ORAL | 3 refills | Status: DC

## 2019-04-02 NOTE — Progress Notes (Signed)
Please inform patient of the following:  His "bad" cholesterol is a bit elevated, but all of his other blood work is normal. His skin biopsy showed mucinosis, this is usually benign and does not cause any issues, but recommend dermatology referral. Please place referral.

## 2019-06-07 ENCOUNTER — Telehealth: Payer: Self-pay

## 2019-06-07 NOTE — Telephone Encounter (Signed)
Patient calling to have the last lab results faxed over to his dermatologists .. patient state that the doctor name is brittany and her fax number [phone].. informed patient that he would need to fill out a medical release paper in order for this to take place.Patient states that he will come into the office on Wednesday

## 2019-06-08 NOTE — Telephone Encounter (Signed)
Noted  

## 2019-12-26 ENCOUNTER — Other Ambulatory Visit: Payer: Self-pay | Admitting: Family Medicine

## 2020-04-01 ENCOUNTER — Other Ambulatory Visit: Payer: Self-pay | Admitting: Family Medicine

## 2020-04-03 ENCOUNTER — Other Ambulatory Visit: Payer: Self-pay | Admitting: Family Medicine

## 2020-04-10 ENCOUNTER — Encounter: Payer: Self-pay | Admitting: Family Medicine

## 2020-04-10 ENCOUNTER — Other Ambulatory Visit: Payer: Self-pay

## 2020-04-10 ENCOUNTER — Ambulatory Visit (INDEPENDENT_AMBULATORY_CARE_PROVIDER_SITE_OTHER): Payer: 59 | Admitting: Family Medicine

## 2020-04-10 VITALS — BP 115/71 | HR 80 | Temp 98.1°F | Ht 76.0 in | Wt 290.4 lb

## 2020-04-10 DIAGNOSIS — I10 Essential (primary) hypertension: Secondary | ICD-10-CM

## 2020-04-10 DIAGNOSIS — Z0001 Encounter for general adult medical examination with abnormal findings: Secondary | ICD-10-CM | POA: Diagnosis not present

## 2020-04-10 DIAGNOSIS — R030 Elevated blood-pressure reading, without diagnosis of hypertension: Secondary | ICD-10-CM

## 2020-04-10 DIAGNOSIS — Z125 Encounter for screening for malignant neoplasm of prostate: Secondary | ICD-10-CM

## 2020-04-10 DIAGNOSIS — L985 Mucinosis of the skin: Secondary | ICD-10-CM | POA: Diagnosis not present

## 2020-04-10 DIAGNOSIS — E890 Postprocedural hypothyroidism: Secondary | ICD-10-CM

## 2020-04-10 DIAGNOSIS — Z23 Encounter for immunization: Secondary | ICD-10-CM | POA: Diagnosis not present

## 2020-04-10 DIAGNOSIS — M109 Gout, unspecified: Secondary | ICD-10-CM

## 2020-04-10 DIAGNOSIS — E785 Hyperlipidemia, unspecified: Secondary | ICD-10-CM

## 2020-04-10 MED ORDER — COLCHICINE 0.6 MG PO CAPS: ORAL_CAPSULE | ORAL | 0 refills | Status: AC

## 2020-04-10 NOTE — Assessment & Plan Note (Signed)
At goal.  Continue losartan-HCTZ 100-25 once daily.

## 2020-04-10 NOTE — Assessment & Plan Note (Signed)
Check lipids 

## 2020-04-10 NOTE — Assessment & Plan Note (Signed)
Symptoms are progressing.  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  Has followed up with local dermatologist once since her last visit.  Patient is concerned he does not have a definitive answer as to why this is happening and why it is getting worse.  Will place referral to tertiary care center.

## 2020-04-10 NOTE — Patient Instructions (Signed)
It was very nice to see you today!  We will check blood work today.  We will give your tetanus vaccine today.  We will refer you to see a dermatologist that [facility] or another [facility].  I will see you back in a year or so for your next physical.  Please come back to see me sooner if needed.  Take care, Dr Jerline Pain  Please try these tips to maintain a healthy lifestyle:   Eat at least 3 REAL meals and 1-2 snacks per day.  Aim for no more than 5 hours between eating.  If you eat breakfast, please do so within one hour of getting up.    Each meal should contain half fruits/vegetables, one quarter protein, and one quarter carbs (no bigger than a computer mouse)   Cut down on sweet beverages. This includes juice, soda, and sweet tea.     Drink at least 1 glass of water with each meal and aim for at least 8 glasses per day   Exercise at least 150 minutes every week.    Preventive Care 24-4 Years Old, Male Preventive care refers to lifestyle choices and visits with your health care provider that can promote health and wellness. This includes:  A yearly physical exam. This is also called an annual well check.  Regular dental and eye exams.  Immunizations.  Screening for certain conditions.  Healthy lifestyle choices, such as eating a healthy diet, getting regular exercise, not using drugs or products that contain nicotine and tobacco, and limiting alcohol use. What can I expect for my preventive care visit? Physical exam Your health care provider will check:  Height and weight. These may be used to calculate body mass index (BMI), which is a measurement that tells if you are at a healthy weight.  Heart rate and blood pressure.  Your skin for abnormal spots. Counseling Your health care provider may ask you questions about:  Alcohol, tobacco, and drug use.  Emotional well-being.  Home and relationship well-being.  Sexual activity.  Eating habits.  Work  and work Statistician. What immunizations do I need?  Influenza (flu) vaccine  This is recommended every year. Tetanus, diphtheria, and pertussis (Tdap) vaccine  You may need a Td booster every 10 years. Varicella (chickenpox) vaccine  You may need this vaccine if you have not already been vaccinated. Zoster (shingles) vaccine  You may need this after age 27. Measles, mumps, and rubella (MMR) vaccine  You may need at least one dose of MMR if you were born in 1957 or later. You may also need a second dose. Pneumococcal conjugate (PCV13) vaccine  You may need this if you have certain conditions and were not previously vaccinated. Pneumococcal polysaccharide (PPSV23) vaccine  You may need one or two doses if you smoke cigarettes or if you have certain conditions. Meningococcal conjugate (MenACWY) vaccine  You may need this if you have certain conditions. Hepatitis A vaccine  You may need this if you have certain conditions or if you travel or work in places where you may be exposed to hepatitis A. Hepatitis B vaccine  You may need this if you have certain conditions or if you travel or work in places where you may be exposed to hepatitis B. Haemophilus influenzae type b (Hib) vaccine  You may need this if you have certain risk factors. Human papillomavirus (HPV) vaccine  If recommended by your health care provider, you may need three doses over 6 months. You may receive  vaccines as individual doses or as more than one vaccine together in one shot (combination vaccines). Talk with your health care provider about the risks and benefits of combination vaccines. What tests do I need? Blood tests  Lipid and cholesterol levels. These may be checked every 5 years, or more frequently if you are over 71 years old.  Hepatitis C test.  Hepatitis B test. Screening  Lung cancer screening. You may have this screening every year starting at age 60 if you have a 30-pack-year history of  smoking and currently smoke or have quit within the past 15 years.  Prostate cancer screening. Recommendations will vary depending on your family history and other risks.  Colorectal cancer screening. All adults should have this screening starting at age 72 and continuing until age 76. Your health care provider may recommend screening at age 38 if you are at increased risk. You will have tests every 1-10 years, depending on your results and the type of screening test.  Diabetes screening. This is done by checking your blood sugar (glucose) after you have not eaten for a while (fasting). You may have this done every 1-3 years.  Sexually transmitted disease (STD) testing. Follow these instructions at home: Eating and drinking  Eat a diet that includes fresh fruits and vegetables, whole grains, lean protein, and low-fat dairy products.  Take vitamin and mineral supplements as recommended by your health care provider.  Do not drink alcohol if your health care provider tells you not to drink.  If you drink alcohol: ? Limit how much you have to 0-2 drinks a day. ? Be aware of how much alcohol is in your drink. In the [place] one drink equals one 12 oz bottle of beer (355 mL), one 5 oz glass of wine (148 mL), or one 1 oz glass of hard liquor (44 mL). Lifestyle  Take daily care of your teeth and gums.  Stay active. Exercise for at least 30 minutes on 5 or more days each week.  Do not use any products that contain nicotine or tobacco, such as cigarettes, e-cigarettes, and chewing tobacco. If you need help quitting, ask your health care provider.  If you are sexually active, practice safe sex. Use a condom or other form of protection to prevent STIs (sexually transmitted infections).  Talk with your health care provider about taking a low-dose aspirin every day starting at age 50. What's next?  Go to your health care provider once a year for a well check visit.  Ask your health care provider  how often you should have your eyes and teeth checked.  Stay up to date on all vaccines. This information is not intended to replace advice given to you by your health care provider. Make sure you discuss any questions you have with your health care provider. Document Revised: 04/09/2018 Document Reviewed: 04/09/2018 Elsevier Patient Education  2020 [employer].

## 2020-04-10 NOTE — Progress Notes (Signed)
m   Chief Complaint:  Alexis Norris is a 59 y.o. male who presents today for his annual comprehensive physical exam.    Assessment/Plan:  Chronic Problems Addressed Today: Gout No recent flares.  Check uric acid level.  Continue allopurinol 100 mg daily.  Essential hypertension At goal.  Continue losartan-HCTZ 100-25 once daily.   Dyslipidemia Check lipids.  Hypothyroidism following radioiodine therapy Check TSH.  Continue Synthroid 112 mcg daily.  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  Has followed up with local dermatologist once since her last visit.  Patient is concerned he does not have a definitive answer as to why this is happening and why it is getting worse.  Will place referral to tertiary care center.  Body mass index is 35.35 kg/m. / Obese  BMI Metric Follow Up - 04/10/20 1031      BMI Metric Follow Up-Please document annually   BMI Metric Follow Up Education provided           Preventative Healthcare: Check CBC, CMET, TSH, lipid panel.  Tdap given today.  Check PSA.  Up-to-date on other vaccines and screenings.  Patient Counseling(The following topics were reviewed and/or handout was given):  -Nutrition: Stressed importance of moderation in sodium/caffeine intake, saturated fat and cholesterol, caloric balance, sufficient intake of fresh fruits, vegetables, and fiber.  -Stressed the importance of regular exercise.   -Substance Abuse: Discussed cessation/primary prevention of tobacco, alcohol, or other drug use; driving or other dangerous activities under the influence; availability of treatment for abuse.   -Injury prevention: Discussed safety belts, safety helmets, smoke detector, smoking near bedding or upholstery.   -Sexuality: Discussed sexually transmitted diseases, partner selection, use of condoms, avoidance of unintended pregnancy and contraceptive alternatives.   -Dental health: Discussed  importance of regular tooth brushing, flossing, and dental visits.  -Health maintenance and immunizations reviewed. Please refer to Health maintenance section.  Return to care in 1 year for next preventative visit.     Subjective:  HPI:  He has no acute complaints today. See A/p for status of chronic conditions.   Lifestyle Diet: Balanced. Cutting out red meat. Cutting out frozen foods.  Exercise: Very busy with work.   Depression screen PHQ 2/9 04/10/2020  Decreased Interest 0  Down, Depressed, Hopeless 0  PHQ - 2 Score 0    Health Maintenance Due  Topic Date Due  . HIV Screening  Never done  . TETANUS/TDAP  06/27/2012  . COVID-19 Vaccine (3 - Booster for Moderna series) 02/08/2020     ROS: Per HPI, otherwise a complete review of systems was negative.   PMH:  The following were reviewed and entered/updated in epic: Past Medical History:  Diagnosis Date  . DYSLIPIDEMIA 01/13/2007   no medicines   . HYPERTENSION 01/13/2007  . 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 Diffuse cutaneous mucinosis 04/10/2020  . Tinea pedis of both feet 03/23/2018  . Stasis dermatitis 08/29/2017  . Lower extremity edema 08/29/2017  . Gout 02/06/2016  . Rt inguinal pain 01/20/2013  . Knee pain 12/15/2012  . Allergic rhinitis, cause unspecified 08/26/2011  . 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 Dyslipidemia 01/13/2007  . Essential hypertension 01/13/2007   Past Surgical History:  Procedure Laterality Date  . I-131 therapy  09/1997   maybe 2000-2001  . INGUINAL HERNIA REPAIR Right 09/28/2013   Procedure: LAPAROSCOPIC RIGHT  INGUINAL HERNIA;  Surgeon: Ralene Ok, MD;  Location: [facility] OR;  Service: General;  Laterality: Right;  . INSERTION OF MESH Right 09/28/2013   Procedure: INSERTION OF MESH;  Surgeon: Ralene Ok, MD;  Location: [place];  Service: General;  Laterality: Right;  . right knuckle     pin  placed in high school    Family History  Problem Relation Age of Onset  . Thyroid cancer Sister   . Heart attack Father   . Cancer Mother   . Colon cancer Neg Hx   . Colon polyps Neg Hx     Medications- reviewed and updated Current Outpatient Medications  Medication Sig Dispense Refill  . allopurinol (ZYLOPRIM) 100 MG tablet TAKE 1 TABLET DAILY 90 tablet 3  . clobetasol ointment (TEMOVATE) 6.72 % APPLY 1 APPLICATION TO AFFECTED AREA TWICE A DAY FOR 14 DAYS THEN ONCE DAILY M F 60 g 2  . levothyroxine (SYNTHROID) 112 MCG tablet TAKE 2 TABLETS DAILY BEFOREBREAKFAST 180 tablet 2  . losartan-hydrochlorothiazide (HYZAAR) 100-25 MG tablet TAKE 1 TABLET DAILY 90 tablet 3  . Colchicine 0.6 MG CAPS Day 1: Take 2 caps, then 1 cap an hour later. Day 2 and beyond: 1 cap daily 30 capsule 0   No current facility-administered medications for this visit.    Allergies-reviewed and updated Allergies  Allergen Reactions  . Simvastatin Nausea Only    REACTION: didn't feel well    Social History   Socioeconomic History  . Marital status: Married    Spouse name: Not on file  . Number of children: Not on file  . Years of education: Not on file  . Highest education level: Not on file  Occupational History  . Occupation: Tour manager  Tobacco Use  . Smoking status: Never Smoker  . Smokeless tobacco: Never Used  Vaping Use  . Vaping Use: Never used  Substance and Sexual Activity  . Alcohol use: Yes    Alcohol/week: 0.0 standard drinks    Comment: infrequently  . Drug use: No  . Sexual activity: Not on file  Other Topics Concern  . Not on file  Social History Narrative   Works Marine scientist   Social Determinants of Radio broadcast assistant Strain: Not on [employer] Insecurity: Not on file  Transportation Needs: Not on file  Physical Activity: Not on file  Stress: Not on file  Social Connections: Not on file        Objective:  Physical Exam: BP 115/71   Pulse 80   Temp  98.1 F (36.7 C) (Temporal)   Ht 6\' 4"  (1.93 m)   Wt 290 lb 6.4 oz (131.7 kg)   SpO2 99%   BMI 35.35 kg/m   Body mass index is 35.35 kg/m. Wt Readings from Last 3 Encounters:  04/10/20 290 lb 6.4 oz (131.7 kg)  03/29/19 278 lb 12.8 oz (126.5 kg)  03/23/18 278 lb 12.8 oz (126.5 kg)   Gen: NAD, resting comfortably HEENT: TMs normal bilaterally. OP clear. No thyromegaly noted.  CV: RRR with no murmurs appreciated Pulm: NWOB, CTAB with no crackles, wheezes, or rhonchi GI: Normal bowel sounds present. Soft, Nontender, Nondistended. MSK: no edema, cyanosis, or clubbing noted Skin: warm, dry.  Several erythematous scattered skin nodules on the lower extremities and upper extremities. Neuro: CN2-12 grossly intact. Strength 5/5 in upper and lower extremities. Reflexes symmetric and intact bilaterally.  Psych: Normal affect and thought content     Raechelle Sarti M. Jerline Pain, MD 04/10/2020 10:32 AM

## 2020-04-10 NOTE — Assessment & Plan Note (Signed)
No recent flares.  Check uric acid level.  Continue allopurinol 100 mg daily.

## 2020-04-10 NOTE — Assessment & Plan Note (Signed)
Check TSH.  Continue Synthroid 112 mcg daily. 

## 2020-04-10 NOTE — Addendum Note (Signed)
Addended by: Betti Cruz on: 04/10/2020 10:45 AM   Modules accepted: Orders

## 2020-04-11 LAB — HEMOGLOBIN A1C
Hgb A1c MFr Bld: 5 % of total Hgb (ref <5.7)
Mean Plasma Glucose: 97 mg/dL
eAG (mmol/L): 5.4 mmol/L

## 2020-04-11 LAB — COMPREHENSIVE METABOLIC PANEL
AG Ratio: 1.4 (calc) (ref 1.0–2.5)
ALT: 11 U/L (ref 9–46)
AST: 14 U/L (ref 10–35)
Albumin: 4.2 g/dL (ref 3.6–5.1)
Alkaline phosphatase (APISO): 64 U/L (ref 35–144)
BUN: 12 mg/dL (ref 7–25)
CO2: 28 mmol/L (ref 20–32)
Calcium: 9.4 mg/dL (ref 8.6–10.3)
Chloride: 104 mmol/L (ref 98–110)
Creat: 0.94 mg/dL (ref 0.70–1.33)
Globulin: 3 g/dL (calc) (ref 1.9–3.7)
Glucose, Bld: 97 mg/dL (ref 65–99)
Potassium: 3.8 mmol/L (ref 3.5–5.3)
Sodium: 142 mmol/L (ref 135–146)
Total Bilirubin: 0.6 mg/dL (ref 0.2–1.2)
Total Protein: 7.2 g/dL (ref 6.1–8.1)

## 2020-04-11 LAB — URIC ACID: Uric Acid, Serum: 6.2 mg/dL (ref 4.0–8.0)

## 2020-04-11 LAB — CBC
HCT: 41.2 % (ref 38.5–50.0)
Hemoglobin: 14 g/dL (ref 13.2–17.1)
MCH: 28.5 pg (ref 27.0–33.0)
MCHC: 34 g/dL (ref 32.0–36.0)
MCV: 83.9 fL (ref 80.0–100.0)
MPV: 9.4 fL (ref 7.5–12.5)
Platelets: 261 10*3/uL (ref 140–400)
RBC: 4.91 10*6/uL (ref 4.20–5.80)
RDW: 13.4 % (ref 11.0–15.0)
WBC: 5.9 10*3/uL (ref 3.8–10.8)

## 2020-04-11 LAB — TSH: TSH: 7.51 mIU/L — ABNORMAL HIGH (ref 0.40–4.50)

## 2020-04-11 LAB — PSA: PSA: 0.64 ng/mL (ref <=4.0)

## 2020-04-11 LAB — LIPID PANEL
Cholesterol: 152 mg/dL (ref <200)
HDL: 50 mg/dL (ref >=40)
LDL Cholesterol (Calc): 85 mg/dL (calc)
Non-HDL Cholesterol (Calc): 102 mg/dL (calc) (ref <130)
Total CHOL/HDL Ratio: 3 (calc) (ref <5.0)
Triglycerides: 83 mg/dL (ref <150)

## 2020-04-12 ENCOUNTER — Other Ambulatory Visit: Payer: Self-pay | Admitting: *Deleted

## 2020-04-12 DIAGNOSIS — R7989 Other specified abnormal findings of blood chemistry: Secondary | ICD-10-CM

## 2020-04-12 NOTE — Progress Notes (Signed)
Please inform patient of the following:  Thyroid levels off a bit but everything else is stable. Recommend he come back in 1-2 weeks to recheck. Please place future order for TSH, free t3 and free t4.   Do not need to make any other changes to his treatment plan at this time. Would like for him to continue working on diet and exercise and we can recheck in a year.  Alexis Norris. Jerline Pain, MD 04/12/2020 9:32 AM

## 2020-04-26 ENCOUNTER — Telehealth: Payer: Self-pay

## 2020-04-26 ENCOUNTER — Other Ambulatory Visit (INDEPENDENT_AMBULATORY_CARE_PROVIDER_SITE_OTHER): Payer: 59

## 2020-04-26 ENCOUNTER — Other Ambulatory Visit: Payer: Self-pay

## 2020-04-26 DIAGNOSIS — R7989 Other specified abnormal findings of blood chemistry: Secondary | ICD-10-CM

## 2020-04-26 LAB — T4, FREE: Free T4: 1 ng/dL (ref 0.60–1.60)

## 2020-04-26 LAB — TSH: TSH: 10.35 u[IU]/mL — ABNORMAL HIGH (ref 0.35–4.50)

## 2020-04-26 LAB — T3, FREE: T3, Free: 2.9 pg/mL (ref 2.3–4.2)

## 2020-04-26 NOTE — Addendum Note (Signed)
Addended by: Cleda Mccreedy F on: 04/26/2020 02:18 PM   Modules accepted: Orders

## 2020-04-26 NOTE — Addendum Note (Signed)
Addended by: Shavanna Furnari F on: 04/26/2020 02:18 PM   Modules accepted: Orders  

## 2020-04-26 NOTE — Addendum Note (Signed)
Addended by: Mallerie Blok F on: 04/26/2020 02:18 PM   Modules accepted: Orders  

## 2020-04-26 NOTE — Telephone Encounter (Signed)
Patient states he called facility for his dermatology referral and the earliest he can be seen is June. He requested that we mark this as an urgent referral to get him in sooner, Please advise.

## 2020-05-01 NOTE — Telephone Encounter (Signed)
Please advise if we need this to be urgent

## 2020-05-01 NOTE — Telephone Encounter (Signed)
Please see below.

## 2020-05-01 NOTE — Telephone Encounter (Signed)
See below

## 2020-05-02 ENCOUNTER — Other Ambulatory Visit: Payer: Self-pay | Admitting: *Deleted

## 2020-05-02 MED ORDER — LEVOTHYROXINE SODIUM 125 MCG PO TABS: ORAL_TABLET | ORAL | 0 refills | Status: DC

## 2020-05-02 NOTE — Progress Notes (Signed)
Please inform patient of the following:  His thyroid levels are still off. Recommend increasing his dose to tablet take 2 daily and we can recheck his TSH in 4-6 weeks.  Katina Degree. Jimmey Ralph, MD 05/02/2020 9:25 AM

## 2020-05-02 NOTE — Telephone Encounter (Signed)
Ok with me. Please place any necessary orders. 

## 2020-07-20 NOTE — Telephone Encounter (Signed)
Spoke with the pt today and he is beeing seen for this on 08/17/20 but he was unable to tell me where he is going-he has agreed to call and let me know

## 2020-08-01 ENCOUNTER — Other Ambulatory Visit: Payer: Self-pay | Admitting: Family Medicine

## 2021-03-15 ENCOUNTER — Other Ambulatory Visit: Payer: Self-pay | Admitting: Family Medicine

## 2021-04-11 ENCOUNTER — Encounter: Payer: Self-pay | Admitting: Family Medicine

## 2021-04-11 ENCOUNTER — Ambulatory Visit (INDEPENDENT_AMBULATORY_CARE_PROVIDER_SITE_OTHER): Payer: 59 | Admitting: Family Medicine

## 2021-04-11 ENCOUNTER — Other Ambulatory Visit: Payer: Self-pay

## 2021-04-11 VITALS — BP 143/80 | HR 77 | Temp 98.0°F | Ht 76.0 in | Wt 269.6 lb

## 2021-04-11 DIAGNOSIS — Z125 Encounter for screening for malignant neoplasm of prostate: Secondary | ICD-10-CM | POA: Diagnosis not present

## 2021-04-11 DIAGNOSIS — E669 Obesity, unspecified: Secondary | ICD-10-CM

## 2021-04-11 DIAGNOSIS — I10 Essential (primary) hypertension: Secondary | ICD-10-CM

## 2021-04-11 DIAGNOSIS — L985 Mucinosis of the skin: Secondary | ICD-10-CM

## 2021-04-11 DIAGNOSIS — Z0001 Encounter for general adult medical examination with abnormal findings: Secondary | ICD-10-CM

## 2021-04-11 DIAGNOSIS — N529 Male erectile dysfunction, unspecified: Secondary | ICD-10-CM | POA: Insufficient documentation

## 2021-04-11 DIAGNOSIS — R739 Hyperglycemia, unspecified: Secondary | ICD-10-CM

## 2021-04-11 DIAGNOSIS — M109 Gout, unspecified: Secondary | ICD-10-CM | POA: Diagnosis not present

## 2021-04-11 DIAGNOSIS — E785 Hyperlipidemia, unspecified: Secondary | ICD-10-CM | POA: Diagnosis not present

## 2021-04-11 DIAGNOSIS — E890 Postprocedural hypothyroidism: Secondary | ICD-10-CM

## 2021-04-11 DIAGNOSIS — Z6832 Body mass index (BMI) 32.0-32.9, adult: Secondary | ICD-10-CM

## 2021-04-11 LAB — COMPREHENSIVE METABOLIC PANEL
ALT: 10 U/L (ref 0–53)
AST: 12 U/L (ref 0–37)
Albumin: 4.3 g/dL (ref 3.5–5.2)
Alkaline Phosphatase: 59 U/L (ref 39–117)
BUN: 18 mg/dL (ref 6–23)
CO2: 30 mEq/L (ref 19–32)
Calcium: 9.2 mg/dL (ref 8.4–10.5)
Chloride: 106 mEq/L (ref 96–112)
Creatinine, Ser: 0.88 mg/dL (ref 0.40–1.50)
GFR: 95.03 mL/min (ref >60.00)
Glucose, Bld: 90 mg/dL (ref 70–99)
Potassium: 3.7 mEq/L (ref 3.5–5.1)
Sodium: 144 mEq/L (ref 135–145)
Total Bilirubin: 0.8 mg/dL (ref 0.2–1.2)
Total Protein: 6.9 g/dL (ref 6.0–8.3)

## 2021-04-11 LAB — LIPID PANEL
Cholesterol: 143 mg/dL (ref 0–200)
HDL: 51.9 mg/dL (ref >39.00)
LDL Cholesterol: 78 mg/dL (ref 0–99)
NonHDL: 91.36
Total CHOL/HDL Ratio: 3
Triglycerides: 67 mg/dL (ref 0.0–149.0)
VLDL: 13.4 mg/dL (ref 0.0–40.0)

## 2021-04-11 LAB — URINALYSIS, ROUTINE W REFLEX MICROSCOPIC
Bilirubin Urine: NEGATIVE
Hgb urine dipstick: NEGATIVE
Ketones, ur: NEGATIVE
Leukocytes,Ua: NEGATIVE
Nitrite: NEGATIVE
RBC / HPF: NONE SEEN (ref 0–?)
Specific Gravity, Urine: 1.025 (ref 1.000–1.030)
Total Protein, Urine: NEGATIVE
Urine Glucose: NEGATIVE
Urobilinogen, UA: 0.2 (ref 0.0–1.0)
pH: 6 (ref 5.0–8.0)

## 2021-04-11 LAB — CBC
HCT: 41.4 % (ref 39.0–52.0)
Hemoglobin: 13.8 g/dL (ref 13.0–17.0)
MCHC: 33.2 g/dL (ref 30.0–36.0)
MCV: 83.8 fl (ref 78.0–100.0)
Platelets: 221 10*3/uL (ref 150.0–400.0)
RBC: 4.94 Mil/uL (ref 4.22–5.81)
RDW: 14.6 % (ref 11.5–15.5)
WBC: 5.4 10*3/uL (ref 4.0–10.5)

## 2021-04-11 LAB — PSA: PSA: 0.65 ng/mL (ref 0.10–4.00)

## 2021-04-11 LAB — TSH: TSH: 1.34 u[IU]/mL (ref 0.35–5.50)

## 2021-04-11 LAB — URIC ACID: Uric Acid, Serum: 5.7 mg/dL (ref 4.0–7.8)

## 2021-04-11 LAB — HEMOGLOBIN A1C: Hgb A1c MFr Bld: 5 % (ref 4.6–6.5)

## 2021-04-11 MED ORDER — TADALAFIL 5 MG PO TABS: 5.0000 mg | ORAL_TABLET | Freq: Every day | ORAL | 11 refills | Status: DC

## 2021-04-11 NOTE — Assessment & Plan Note (Signed)
Continue management per dermatology.  

## 2021-04-11 NOTE — Assessment & Plan Note (Signed)
No recent flares.  Check uric acid.  Continue allopurinol 100 mg daily.

## 2021-04-11 NOTE — Assessment & Plan Note (Signed)
Check labs.  He has lost about 20 pounds in the last few months.  Congratulated patient on weight loss.

## 2021-04-11 NOTE — Patient Instructions (Signed)
It was very nice to see you today!  We will check blood work and a urine sample.  Please continue working on diet and exercise.  Keep an eye on your blood pressure and let me know if it is persistently 140/90 or higher.  Please get your flu shot soon.  We will start Cialis.  Please send me a message in a few weeks to let me know how this is working for you.  We will see back in year for your physical.  Come back sooner if needed.  Take care, Dr Jerline Pain  PLEASE NOTE:  If you had any lab tests please let us know if you have not heard back within a few days. You may see your results on mychart before we have a chance to review them but we will give you a call once they are reviewed by [place]. If we ordered any referrals today, please let us know if you have not heard from their office within the next week.   Please try these tips to maintain a healthy lifestyle:  Eat at least 3 REAL meals and 1-2 snacks per day.  Aim for no more than 5 hours between eating.  If you eat breakfast, please do so within one hour of getting up.   Each meal should contain half fruits/vegetables, one quarter protein, and one quarter carbs (no bigger than a computer mouse)  Cut down on sweet beverages. This includes juice, soda, and sweet tea.   Drink at least 1 glass of water with each meal and aim for at least 8 glasses per day  Exercise at least 150 minutes every week.    Preventive Care 58-36 Years Old, Male Preventive care refers to lifestyle choices and visits with your health care provider that can promote health and wellness. Preventive care visits are also called wellness exams. What can I expect for my preventive care visit? Counseling During your preventive care visit, your health care provider may ask about your: Medical history, including: Past medical problems. Family medical history. Current health, including: Emotional well-being. Home life and relationship well-being. Sexual  activity. Lifestyle, including: Alcohol, nicotine or tobacco, and drug use. Access to firearms. Diet, exercise, and sleep habits. Safety issues such as seatbelt and bike helmet use. Sunscreen use. Work and work Statistician. Physical exam Your health care provider will check your: Height and weight. These may be used to calculate your BMI (body mass index). BMI is a measurement that tells if you are at a healthy weight. Waist circumference. This measures the distance around your waistline. This measurement also tells if you are at a healthy weight and may help predict your risk of certain diseases, such as type 2 diabetes and high blood pressure. Heart rate and blood pressure. Body temperature. Skin for abnormal spots. What immunizations do I need? Vaccines are usually given at various ages, according to a schedule. Your health care provider will recommend vaccines for you based on your age, medical history, and lifestyle or other factors, such as travel or where you work. What tests do I need? Screening Your health care provider may recommend screening tests for certain conditions. This may include: Lipid and cholesterol levels. Diabetes screening. This is done by checking your blood sugar (glucose) after you have not eaten for a while (fasting). Hepatitis B test. Hepatitis C test. HIV (human immunodeficiency virus) test. STI (sexually transmitted infection) testing, if you are at risk. Lung cancer screening. Prostate cancer screening. Colorectal cancer screening. Talk with your  health care provider about your test results, treatment options, and if necessary, the need for more tests. Follow these instructions at home: Eating and drinking  Eat a diet that includes fresh fruits and vegetables, whole grains, lean protein, and low-fat dairy products. Take vitamin and mineral supplements as recommended by your health care provider. Do not drink alcohol if your health care provider  tells you not to drink. If you drink alcohol: Limit how much you have to 0-2 drinks a day. Know how much alcohol is in your drink. In the [place] one drink equals one 12 oz bottle of beer (355 mL), one 5 oz glass of wine (148 mL), or one 1 oz glass of hard liquor (44 mL). Lifestyle Brush your teeth every morning and night with fluoride toothpaste. Floss one time each day. Exercise for at least 30 minutes 5 or more days each week. Do not use any products that contain nicotine or tobacco. These products include cigarettes, chewing tobacco, and vaping devices, such as e-cigarettes. If you need help quitting, ask your health care provider. Do not use drugs. If you are sexually active, practice safe sex. Use a condom or other form of protection to prevent STIs. Take aspirin only as told by your health care provider. Make sure that you understand how much to take and what form to take. Work with your health care provider to find out whether it is safe and beneficial for you to take aspirin daily. Find healthy ways to manage stress, such as: Meditation, yoga, or listening to music. Journaling. Talking to a trusted person. Spending time with friends and family. Minimize exposure to UV radiation to reduce your risk of skin cancer. Safety Always wear your seat belt while driving or riding in a vehicle. Do not drive: If you have been drinking alcohol. Do not ride with someone who has been drinking. When you are tired or distracted. While texting. If you have been using any mind-altering substances or drugs. Wear a helmet and other protective equipment during sports activities. If you have firearms in your house, make sure you follow all gun safety procedures. What's next? Go to your health care provider once a year for an annual wellness visit. Ask your health care provider how often you should have your eyes and teeth checked. Stay up to date on all vaccines. This information is not intended to  replace advice given to you by your health care provider. Make sure you discuss any questions you have with your health care provider. Document Revised: 10/11/2020 Document Reviewed: 10/11/2020 Elsevier Patient Education  [place].

## 2021-04-11 NOTE — Assessment & Plan Note (Signed)
Slightly above goal though typically well controlled.  Continue losartan-HCTZ 100-25 once daily.  Discussed home blood pressure monitoring goal 140/90 or lower.

## 2021-04-11 NOTE — Progress Notes (Signed)
Chief Complaint:  Alexis Norris is a 60 y.o. male who presents today for his annual comprehensive physical exam.    Assessment/Plan:  Chronic Problems Addressed Today: Erectile dysfunction Discussed treatment options with patient.  We will start Cialis 5 mg daily.  Discussed potential side effects.  He will check with me in 1 to 2 weeks to let me know how this is working.  Essential hypertension Slightly above goal though typically well controlled.  Continue losartan-HCTZ 100-25 once daily.  Discussed home blood pressure monitoring goal 140/90 or lower.  Dyslipidemia Check labs.  He has lost about 20 pounds in the last few months.  Congratulated patient on weight loss.  Hypothyroidism following radioiodine therapy Continue management per endocrinology.  He is on Synthroid 250 mcg daily.  We will check TSH.  Diffuse cutaneous mucinosis Continue management per dermatology.  Gout No recent flares.  Check uric acid.  Continue allopurinol 100 mg daily.  Body mass index is 32.82 kg/m. / Obese  BMI Metric Follow Up - 04/11/21 1012       BMI Metric Follow Up-Please document annually   BMI Metric Follow Up Education provided             Preventative Healthcare: Discussed flu shot.  He will get next week.  Due for colonoscopy in 5 years.  We will check labs.  Patient Counseling(The following topics were reviewed and/or handout was given):  -Nutrition: Stressed importance of moderation in sodium/caffeine intake, saturated fat and cholesterol, caloric balance, sufficient intake of fresh fruits, vegetables, and fiber.  -Stressed the importance of regular exercise.   -Substance Abuse: Discussed cessation/primary prevention of tobacco, alcohol, or other drug use; driving or other dangerous activities under the influence; availability of treatment for abuse.   -Injury prevention: Discussed safety belts, safety helmets, smoke detector, smoking near bedding or upholstery.    -Sexuality: Discussed sexually transmitted diseases, partner selection, use of condoms, avoidance of unintended pregnancy and contraceptive alternatives.   -Dental health: Discussed importance of regular tooth brushing, flossing, and dental visits.  -Health maintenance and immunizations reviewed. Please refer to Health maintenance section.  Return to care in 1 year for next preventative visit.     Subjective:  HPI:  He has no acute complaints today.  See A/P for status of chronic conditions.  He has been following with dermatology and endocrinology since our last visit.  His reactive mucinosis is improving.  Thyroid is under good control.  He has had some intermittent issues with erectile dysfunction for the last several months.  Seems to be worsening.  Lifestyle Diet: Cutting out sugar.  Exercise: Busy with work at the [employer].   Depression screen PHQ 2/9 04/11/2021  Decreased Interest 0  Down, Depressed, Hopeless 0  PHQ - 2 Score 0    Health Maintenance Due  Topic Date Due   HIV Screening  Never done   Zoster Vaccines- Shingrix (1 of 2) Never done   COVID-19 Vaccine (3 - Booster for Moderna series) 10/04/2019     ROS: Per HPI, otherwise a complete review of systems was negative.   PMH:  The following were reviewed and entered/updated in epic: Past Medical History:  Diagnosis Date   DYSLIPIDEMIA 01/13/2007   no medicines    HYPERTENSION 01/13/2007   HYPOTHYROIDISM, 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   Patient Active Problem List   Diagnosis Date Noted   Erectile dysfunction 04/11/2021   Diffuse cutaneous mucinosis 04/10/2020   Tinea pedis of both feet 03/23/2018   Stasis  dermatitis 08/29/2017   Lower extremity edema 08/29/2017   Gout 02/06/2016   Rt inguinal pain 01/20/2013   Knee pain 12/15/2012   Allergic rhinitis, cause unspecified 08/26/2011   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   Dyslipidemia 01/13/2007   Essential hypertension 01/13/2007    Past Surgical History:  Procedure Laterality Date   I-131 therapy  09/1997   maybe 2000-2001   INGUINAL HERNIA REPAIR Right 09/28/2013   Procedure: LAPAROSCOPIC RIGHT  INGUINAL HERNIA;  Surgeon: Ralene Ok, MD;  Location: [facility];  Service: General;  Laterality: Right;   INSERTION OF MESH Right 09/28/2013   Procedure: INSERTION OF MESH;  Surgeon: Ralene Ok, MD;  Location: [facility];  Service: General;  Laterality: Right;   right knuckle     pin placed in high school    Family History  Problem Relation Age of Onset   Thyroid cancer Sister    Heart attack Father    Cancer Mother    Colon cancer Neg Hx    Colon polyps Neg Hx     Medications- reviewed and updated Current Outpatient Medications  Medication Sig Dispense Refill   allopurinol (ZYLOPRIM) 100 MG tablet TAKE 1 TABLET DAILY 90 tablet 3   clobetasol ointment (TEMOVATE) 5.68 % APPLY 1 APPLICATION TO AFFECTED AREA TWICE A DAY FOR 14 DAYS THEN ONCE DAILY M F 60 g 2   levothyroxine (SYNTHROID) 125 MCG tablet TAKE 2 TABLETS DAILY BEFOREBREAKFAST 180 tablet 0   losartan-hydrochlorothiazide (HYZAAR) 100-25 MG tablet TAKE 1 TABLET DAILY 90 tablet 3   tadalafil (CIALIS) 5 MG tablet Take 1 tablet (5 mg total) by mouth daily. 30 tablet 11   Colchicine 0.6 MG CAPS Day 1: Take 2 caps, then 1 cap an hour later. Day 2 and beyond: 1 cap daily (Patient not taking: Reported on 04/11/2021) 30 capsule 0   No current facility-administered medications for this visit.    Allergies-reviewed and updated Allergies  Allergen Reactions   Simvastatin Nausea Only    REACTION: didn't feel well    Social History   Socioeconomic History   Marital status: Married    Spouse name: Not on file   Number of children: Not on file   Years of education: Not on file   Highest education level: Not on file  Occupational History   Occupation: Tour manager  Tobacco Use   Smoking status: Never   Smokeless tobacco: Never  Vaping Use   Vaping Use:  Never used  Substance and Sexual Activity   Alcohol use: Yes    Alcohol/week: 0.0 standard drinks    Comment: infrequently   Drug use: No   Sexual activity: Not on file  Other Topics Concern   Not on file  Social History Narrative   Works Marine scientist   Social Determinants of Radio broadcast assistant Strain: Not on file  Food Insecurity: Not on file  Transportation Needs: Not on file  Physical Activity: Not on file  Stress: Not on file  Social Connections: Not on file        Objective:  Physical Exam: BP (!) 143/80    Pulse 77    Temp 98 F (36.7 C) (Temporal)    Ht 6\' 4"  (1.93 m)    Wt 269 lb 9.6 oz (122.3 kg)    SpO2 97%    BMI 32.82 kg/m   Body mass index is 32.82 kg/m. Wt Readings from Last 3 Encounters:  04/11/21 269 lb 9.6 oz (122.3 kg)  04/10/20  290 lb 6.4 oz (131.7 kg)  03/29/19 278 lb 12.8 oz (126.5 kg)   Gen: NAD, resting comfortably HEENT: TMs normal bilaterally. OP clear. No thyromegaly noted.  CV: RRR with no murmurs appreciated Pulm: NWOB, CTAB with no crackles, wheezes, or rhonchi GI: Normal bowel sounds present. Soft, Nontender, Nondistended. MSK: no edema, cyanosis, or clubbing noted Skin: warm, dry Neuro: CN2-12 grossly intact. Strength 5/5 in upper and lower extremities. Reflexes symmetric and intact bilaterally.  Psych: Normal affect and thought content     Karalyn Kadel M. Jerline Pain, MD 04/11/2021 10:12 AM

## 2021-04-11 NOTE — Assessment & Plan Note (Signed)
Continue management per endocrinology.  He is on Synthroid 250 mcg daily.  We will check TSH.

## 2021-04-11 NOTE — Assessment & Plan Note (Signed)
Discussed treatment options with patient.  We will start Cialis 5 mg daily.  Discussed potential side effects.  He will check with me in 1 to 2 weeks to let me know how this is working.

## 2021-04-13 NOTE — Progress Notes (Signed)
Please inform patient of the following:  Good news! Labs are all NORMAL. Do not need to make any changes to his treatment plan at this time. Would like for him to keep up the good work and we can recheck in a year.  Alexis Norris. Jerline Pain, MD 04/13/2021 8:03 AM

## 2021-04-17 ENCOUNTER — Ambulatory Visit: Payer: 59

## 2021-04-17 ENCOUNTER — Telehealth (INDEPENDENT_AMBULATORY_CARE_PROVIDER_SITE_OTHER): Payer: 59 | Admitting: Physician Assistant

## 2021-04-17 DIAGNOSIS — R6889 Other general symptoms and signs: Secondary | ICD-10-CM | POA: Diagnosis not present

## 2021-04-17 MED ORDER — OSELTAMIVIR PHOSPHATE 75 MG PO CAPS: 75.0000 mg | ORAL_CAPSULE | Freq: Two times a day (BID) | ORAL | 0 refills | Status: DC

## 2021-04-17 NOTE — Progress Notes (Signed)
Virtual Visit via Video Note  I connected with  Alexis Norris  on 04/17/21 at  1:30 PM EST by a video enabled telemedicine application and verified that I am speaking with the correct person using two identifiers.  Location: Patient: home Provider: Therapist, music at [place] present: Patient and myself   I discussed the limitations of evaluation and management by telemedicine and the availability of in person appointments. The patient expressed understanding and agreed to proceed.   History of Present Illness:  Chief complaint: Flu-like symptoms Symptom onset: Two days ago came on suddenly Pertinent positives: ST, coughing, body aches and chills, fatigue Pertinent negatives: Fever, nausea, SOB, CP, V/D Treatments tried: Nyquil, Dayquil, Airborne Vitamin C Vaccine status: He is not UTD on flu or COVID vaccines Sick exposure: Recent travel on airplane last week   Home COVID-19 test was negative last night     Observations/Objective:   Gen: Awake, alert, no acute distress; general malaise, lying down during virtual visit Resp: Breathing is even and non-labored Psych: calm/pleasant demeanor Neuro: Alert and Oriented x 3, + facial symmetry, speech is clear.   Assessment and Plan:  1. Flu-like symptoms -Home COVID-19 test negative -With patient's recent travel and sudden onset of symptoms, I have strong suspicion for influenza -Plan to start patient on Tamiflu 75 mg twice daily for 5 days -He is also going to recheck a home COVID test tomorrow afternoon and let us know if anything changes -Encouraged him to continue use of Tylenol or ibuprofen as needed, rest, push fluids -Also offered recheck in person later this week if needed -Work note provided for him to be out this week.   Follow Up Instructions:    I discussed the assessment and treatment plan with the patient. The patient was provided an opportunity to ask questions and all were answered.  The patient agreed with the plan and demonstrated an understanding of the instructions.   The patient was advised to call back or seek an in-person evaluation if the symptoms worsen or if the condition fails to improve as anticipated.  Brennin Durfee M Katrenia Alkins, PA-C

## 2021-04-18 ENCOUNTER — Telehealth: Payer: Self-pay

## 2021-04-18 NOTE — Telephone Encounter (Signed)
Patient states he tested positive today for COVID.  States he now has  diarrhea.  Was seen by [facility] yesterday 04/17/21.   Would like to know if there is anything different he needs to do.

## 2021-04-19 NOTE — Telephone Encounter (Signed)
Patient notified he voices understanding

## 2021-04-24 ENCOUNTER — Ambulatory Visit: Payer: 59

## 2021-04-24 ENCOUNTER — Telehealth: Payer: Self-pay

## 2021-04-24 NOTE — Telephone Encounter (Signed)
Please see message. °

## 2021-04-24 NOTE — Telephone Encounter (Signed)
Patient has cancelled appt today for flu vac due to having flu and COVID.   Patient wants to know if Dr. Jerline Pain recommends him to get the flu vac now that he has had both flu and COVID.

## 2021-04-25 ENCOUNTER — Ambulatory Visit: Payer: 59 | Admitting: Family Medicine

## 2021-04-25 MED ORDER — BENZONATATE 200 MG PO CAPS: 200.0000 mg | ORAL_CAPSULE | Freq: Two times a day (BID) | ORAL | 0 refills | Status: DC | PRN

## 2021-04-25 NOTE — Telephone Encounter (Signed)
Yes it is still a good idea for him to get the flu vaccine.  Algis Greenhouse. Jerline Pain, MD 04/25/2021 7:47 AM

## 2021-04-25 NOTE — Telephone Encounter (Signed)
Spoke with pt verified DOB, pt was advised recommendation of getting flu shot. Pt is asking if maybe there is something that could be prescribed for congestion and cough. Please advise

## 2021-04-25 NOTE — Telephone Encounter (Signed)
Ok to send in tessalon 200mg  tablets take bid prn cough. Dispense #30 with no refills.  Needs office visit if still not improving.  Algis Greenhouse. Jerline Pain, MD 04/25/2021 9:46 AM

## 2021-04-25 NOTE — Telephone Encounter (Signed)
Spoke to pt told him Dr. Jerline Pain said okay to send in Tessalon capsules for cough. If no improvement needs an appt. Pt verbalized understanding and asked how long should I give it. Told pt if not better with in another week need to make an appt. Pt verbalized understanding.

## 2021-09-19 ENCOUNTER — Telehealth: Payer: 59 | Admitting: Physician Assistant

## 2021-09-19 VITALS — Temp 99.9°F | Ht 76.0 in | Wt 269.9 lb

## 2021-09-19 DIAGNOSIS — R52 Pain, unspecified: Secondary | ICD-10-CM

## 2021-09-19 NOTE — Progress Notes (Signed)
   Virtual Visit via Video Note  I connected with  Alexis Norris  on 09/19/21 at  1:00 PM EDT by a video enabled telemedicine application and verified that I am speaking with the correct person using two identifiers.  Location: Patient: home Provider: Therapist, music at [place] present: Patient and myself   I discussed the limitations of evaluation and management by telemedicine and the availability of in person appointments. The patient expressed understanding and agreed to proceed.   History of Present Illness:  Chief complaint: Woke up with body aches today Symptom onset: 3 days ago Pertinent positives: ST resolving, sinus congestion, temp 99.9 F, fatigue, occ sneezing, coughing fits  Pertinent negatives: SOB, CP, n/v/d, abd pain  Treatments tried: Nyquil, Advil Cold & sinus, both have helped  Sick exposure: No sick contacts he's aware of  COVID-19 test at home was negative this morning   [place]    Observations/Objective:   Gen: Awake, alert, no acute distress, some congestion Resp: Breathing is even and non-labored, slight cough Psych: calm/pleasant demeanor Neuro: Alert and Oriented x 3, + facial symmetry, speech is clear.   Assessment and Plan:  1. Generalized body aches Reassured patient that symptoms sound most consistent with viral syndrome.  We may have false negative COVID test.  Encouraged him to repeat testing tomorrow and let me know the results.  No findings on virtual exam or in his history that indicate need for antibiotics at this time.  Plan for conservative care with DayQuil and NyQuil as he has been doing, rest, fluids.  Patient to let me know how he is doing tomorrow and I can provide work note as needed.   Follow Up Instructions:    I discussed the assessment and treatment plan with the patient. The patient was provided an opportunity to ask questions and all were answered. The patient agreed  with the plan and demonstrated an understanding of the instructions.   The patient was advised to call back or seek an in-person evaluation if the symptoms worsen or if the condition fails to improve as anticipated.  Florentine Diekman M Adaiah Morken, PA-C

## 2022-01-21 ENCOUNTER — Encounter: Payer: Self-pay | Admitting: *Deleted

## 2022-02-22 ENCOUNTER — Other Ambulatory Visit: Payer: Self-pay | Admitting: Family Medicine

## 2022-03-24 ENCOUNTER — Other Ambulatory Visit: Payer: Self-pay | Admitting: Family Medicine

## 2022-04-11 ENCOUNTER — Encounter: Payer: Self-pay | Admitting: *Deleted

## 2022-04-17 ENCOUNTER — Ambulatory Visit (INDEPENDENT_AMBULATORY_CARE_PROVIDER_SITE_OTHER): Payer: 59 | Admitting: Family Medicine

## 2022-04-17 ENCOUNTER — Encounter: Payer: Self-pay | Admitting: Family Medicine

## 2022-04-17 VITALS — BP 135/77 | HR 81 | Temp 97.5°F | Ht 76.0 in | Wt 286.4 lb

## 2022-04-17 DIAGNOSIS — R739 Hyperglycemia, unspecified: Secondary | ICD-10-CM | POA: Diagnosis not present

## 2022-04-17 DIAGNOSIS — K219 Gastro-esophageal reflux disease without esophagitis: Secondary | ICD-10-CM

## 2022-04-17 DIAGNOSIS — Z0001 Encounter for general adult medical examination with abnormal findings: Secondary | ICD-10-CM | POA: Diagnosis not present

## 2022-04-17 DIAGNOSIS — M109 Gout, unspecified: Secondary | ICD-10-CM

## 2022-04-17 DIAGNOSIS — N529 Male erectile dysfunction, unspecified: Secondary | ICD-10-CM

## 2022-04-17 DIAGNOSIS — E785 Hyperlipidemia, unspecified: Secondary | ICD-10-CM

## 2022-04-17 DIAGNOSIS — K5790 Diverticulosis of intestine, part unspecified, without perforation or abscess without bleeding: Secondary | ICD-10-CM

## 2022-04-17 DIAGNOSIS — Z125 Encounter for screening for malignant neoplasm of prostate: Secondary | ICD-10-CM

## 2022-04-17 DIAGNOSIS — L985 Mucinosis of the skin: Secondary | ICD-10-CM | POA: Diagnosis not present

## 2022-04-17 DIAGNOSIS — I10 Essential (primary) hypertension: Secondary | ICD-10-CM

## 2022-04-17 DIAGNOSIS — E890 Postprocedural hypothyroidism: Secondary | ICD-10-CM

## 2022-04-17 LAB — COMPREHENSIVE METABOLIC PANEL
ALT: 10 U/L (ref 0–53)
AST: 12 U/L (ref 0–37)
Albumin: 4.3 g/dL (ref 3.5–5.2)
Alkaline Phosphatase: 59 U/L (ref 39–117)
BUN: 15 mg/dL (ref 6–23)
CO2: 28 mEq/L (ref 19–32)
Calcium: 9.3 mg/dL (ref 8.4–10.5)
Chloride: 104 mEq/L (ref 96–112)
Creatinine, Ser: 0.91 mg/dL (ref 0.40–1.50)
GFR: 92.48 mL/min (ref >60.00)
Glucose, Bld: 93 mg/dL (ref 70–99)
Potassium: 3.3 mEq/L — ABNORMAL LOW (ref 3.5–5.1)
Sodium: 142 mEq/L (ref 135–145)
Total Bilirubin: 0.7 mg/dL (ref 0.2–1.2)
Total Protein: 6.7 g/dL (ref 6.0–8.3)

## 2022-04-17 LAB — PSA: PSA: 1.08 ng/mL (ref 0.10–4.00)

## 2022-04-17 LAB — CBC
HCT: 39 % (ref 39.0–52.0)
Hemoglobin: 13.5 g/dL (ref 13.0–17.0)
MCHC: 34.8 g/dL (ref 30.0–36.0)
MCV: 82.6 fl (ref 78.0–100.0)
Platelets: 222 10*3/uL (ref 150.0–400.0)
RBC: 4.72 Mil/uL (ref 4.22–5.81)
RDW: 14.3 % (ref 11.5–15.5)
WBC: 7.4 10*3/uL (ref 4.0–10.5)

## 2022-04-17 LAB — HEMOGLOBIN A1C: Hgb A1c MFr Bld: 5 % (ref 4.6–6.5)

## 2022-04-17 LAB — LIPID PANEL
Cholesterol: 161 mg/dL (ref 0–200)
HDL: 55.5 mg/dL (ref >39.00)
LDL Cholesterol: 88 mg/dL (ref 0–99)
NonHDL: 105.04
Total CHOL/HDL Ratio: 3
Triglycerides: 87 mg/dL (ref 0.0–149.0)
VLDL: 17.4 mg/dL (ref 0.0–40.0)

## 2022-04-17 LAB — URIC ACID: Uric Acid, Serum: 5 mg/dL (ref 4.0–7.8)

## 2022-04-17 NOTE — Assessment & Plan Note (Signed)
On prednisone per dermatology.  Recently decreased from 10 mg to 5 mg daily.  Symptoms seem to be improving.

## 2022-04-17 NOTE — Progress Notes (Signed)
Chief Complaint:  Alexis Norris is a 61 y.o. male who presents today for his annual comprehensive physical exam.    Assessment/Plan:  Chronic Problems Addressed Today: Hypothyroidism following radioiodine therapy Follows with endocrinology.  On Synthroid to 50 mcg daily.  Will defer checking TSH to endocrinology.  Dyslipidemia Check lipids.  Discussed lifestyle modifications.  Essential hypertension At goal today on losartan-HCTZ 100-25 once daily.  Check labs.  Gout Stable.  No recent flares.  On allopurinol 100 mg daily.  Check uric acid level.  Diffuse cutaneous mucinosis On prednisone per dermatology.  Recently decreased from 10 mg to 5 mg daily.  Symptoms seem to be improving.  Erectile dysfunction On Cialis 5 mg daily as needed.  Diverticulosis Patient does note that he has occasional flareup when eating peanuts.  This is not particularly bothersome.  He has not had any signs of persistent bleeding or abdominal pain.  We discussed reasons to return to care.  He will try to avoid triggering foods.  GERD (gastroesophageal reflux disease) Overall symptoms are stable.  Usually worse at night when lying flat on his back.  Does note that raising the head of his bed helps.  We did discuss starting medication however he declined.  Symptoms are currently manageable.  He will let me know if symptoms worsen.  We discussed reasons return to care.   Preventative Healthcare: Check labs.  Vaccines declined.  Up-to-date on colon cancer screening.  Patient Counseling(The following topics were reviewed and/or handout was given):  -Nutrition: Stressed importance of moderation in sodium/caffeine intake, saturated fat and cholesterol, caloric balance, sufficient intake of fresh fruits, vegetables, and fiber.  -Stressed the importance of regular exercise.   -Substance Abuse: Discussed cessation/primary prevention of tobacco, alcohol, or other drug use; driving or other dangerous activities  under the influence; availability of treatment for abuse.   -Injury prevention: Discussed safety belts, safety helmets, smoke detector, smoking near bedding or upholstery.   -Sexuality: Discussed sexually transmitted diseases, partner selection, use of condoms, avoidance of unintended pregnancy and contraceptive alternatives.   -Dental health: Discussed importance of regular tooth brushing, flossing, and dental visits.  -Health maintenance and immunizations reviewed. Please refer to Health maintenance section.  Return to care in 1 year for next preventative visit.     Subjective:  HPI:  He has no acute complaints today. See A/p for status of chronic conditions.   Lifestyle Diet: Working on cutting down on sugar.  Exercise: None specific.      04/17/2022    7:44 AM  Depression screen PHQ 2/9  Decreased Interest 0  Down, Depressed, Hopeless 0  PHQ - 2 Score 0    Health Maintenance Due  Topic Date Due   HIV Screening  Never done     ROS: Per HPI, otherwise a complete review of systems was negative.   PMH:  The following were reviewed and entered/updated in epic: Past Medical History:  Diagnosis Date   DYSLIPIDEMIA 01/13/2007   no medicines    HYPERTENSION 01/13/2007   HYPOTHYROIDISM, 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   Patient Active Problem List   Diagnosis Date Noted   Diverticulosis 04/17/2022   GERD (gastroesophageal reflux disease) 04/17/2022   Erectile dysfunction 04/11/2021   Diffuse cutaneous mucinosis 04/10/2020   Gout 02/06/2016   Allergic rhinitis, cause unspecified 08/26/2011   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   Dyslipidemia 01/13/2007   Essential hypertension 01/13/2007   Past Surgical History:  Procedure Laterality Date   I-131 therapy  09/1997  maybe 2000-2001   INGUINAL HERNIA REPAIR Right 09/28/2013   Procedure: LAPAROSCOPIC RIGHT  INGUINAL HERNIA;  Surgeon: Ralene Ok, MD;  Location: [facility];  Service: General;  Laterality:  Right;   INSERTION OF MESH Right 09/28/2013   Procedure: INSERTION OF MESH;  Surgeon: Ralene Ok, MD;  Location: [facility];  Service: General;  Laterality: Right;   right knuckle     pin placed in high school    Family History  Problem Relation Age of Onset   Thyroid cancer Sister    Heart attack Father    Cancer Mother    Colon cancer Neg Hx    Colon polyps Neg Hx     Medications- reviewed and updated Current Outpatient Medications  Medication Sig Dispense Refill   allopurinol (ZYLOPRIM) 100 MG tablet TAKE 1 TABLET DAILY 90 tablet 3   Colchicine 0.6 MG CAPS Day 1: Take 2 caps, then 1 cap an hour later. Day 2 and beyond: 1 cap daily 30 capsule 0   levothyroxine (SYNTHROID) 125 MCG tablet TAKE 2 TABLETS DAILY BEFOREBREAKFAST 180 tablet 0   losartan-hydrochlorothiazide (HYZAAR) 100-25 MG tablet TAKE 1 TABLET DAILY 90 tablet 3   predniSONE (DELTASONE) 10 MG tablet Take 5 mg by mouth daily.     tadalafil (CIALIS) 5 MG tablet TAKE 1 TABLET DAILY 30 tablet 0   No current facility-administered medications for this visit.    Allergies-reviewed and updated Allergies  Allergen Reactions   Simvastatin Nausea Only    REACTION: didn't feel well    Social History   Socioeconomic History   Marital status: Married    Spouse name: Not on file   Number of children: Not on file   Years of education: Not on file   Highest education level: Not on file  Occupational History   Occupation: Tour manager  Tobacco Use   Smoking status: Never   Smokeless tobacco: Never  Vaping Use   Vaping Use: Never used  Substance and Sexual Activity   Alcohol use: Yes    Alcohol/week: 0.0 standard drinks of alcohol    Comment: infrequently   Drug use: No   Sexual activity: Not on file  Other Topics Concern   Not on file  Social History Narrative   Works Marine scientist   Social Determinants of Radio broadcast assistant Strain: Not on file  Food Insecurity: Not on file  Transportation Needs: Not  on file  Physical Activity: Not on file  Stress: Not on file  Social Connections: Not on file        Objective:  Physical Exam: BP 135/77   Pulse 81   Temp (!) 97.5 F (36.4 C) (Temporal)   Ht '6\' 4"'$  (1.93 m)   Wt 286 lb 6.4 oz (129.9 kg)   SpO2 97%   BMI 34.86 kg/m   Body mass index is 34.86 kg/m. Wt Readings from Last 3 Encounters:  04/17/22 286 lb 6.4 oz (129.9 kg)  09/19/21 269 lb 14.4 oz (122.4 kg)  04/11/21 269 lb 9.6 oz (122.3 kg)   Gen: NAD, resting comfortably HEENT: TMs normal bilaterally. OP clear. No thyromegaly noted.  CV: RRR with no murmurs appreciated Pulm: NWOB, CTAB with no crackles, wheezes, or rhonchi GI: Normal bowel sounds present. Soft, Nontender, Nondistended. MSK: no edema, cyanosis, or clubbing noted Skin: warm, dry.  Bilateral lower extremities with erythematous hyperpigmented nodular rash improved compared to previous exams. Neuro: CN2-12 grossly intact. Strength 5/5 in upper and lower extremities. Reflexes symmetric  and intact bilaterally.  Psych: Normal affect and thought content     Montrell Cessna M. Jerline Pain, MD 04/17/2022 8:16 AM

## 2022-04-17 NOTE — Assessment & Plan Note (Signed)
Stable.  No recent flares.  On allopurinol 100 mg daily.  Check uric acid level.

## 2022-04-17 NOTE — Assessment & Plan Note (Signed)
At goal today on losartan-HCTZ 100-25 once daily.  Check labs.

## 2022-04-17 NOTE — Assessment & Plan Note (Signed)
On Cialis 5 mg daily as needed.

## 2022-04-17 NOTE — Assessment & Plan Note (Signed)
Patient does note that he has occasional flareup when eating peanuts.  This is not particularly bothersome.  He has not had any signs of persistent bleeding or abdominal pain.  We discussed reasons to return to care.  He will try to avoid triggering foods.

## 2022-04-17 NOTE — Assessment & Plan Note (Signed)
Follows with endocrinology.  On Synthroid to 50 mcg daily.  Will defer checking TSH to endocrinology.

## 2022-04-17 NOTE — Assessment & Plan Note (Signed)
Check lipids. Discussed lifestyle modifications.  

## 2022-04-17 NOTE — Patient Instructions (Signed)
It was very nice to see you today!  We will check blood work today.  Please continue to work on your diet and exercise.  No medication changes today.  We will see back in year for your next physical.  Please come back to see Alexis Norris sooner if needed.  Take care, Dr Jerline Pain  PLEASE NOTE:  If you had any lab tests, please let Alexis Norris know if you have not heard back within a few days. You may see your results on mychart before we have a chance to review them but we will give you a call once they are reviewed by Alexis Norris.   If we ordered any referrals today, please let Alexis Norris know if you have not heard from their office within the next week.   If you had any urgent prescriptions sent in today, please check with the pharmacy within an hour of our visit to make sure the prescription was transmitted appropriately.   Please try these tips to maintain a healthy lifestyle:  Eat at least 3 REAL meals and 1-2 snacks per day.  Aim for no more than 5 hours between eating.  If you eat breakfast, please do so within one hour of getting up.   Each meal should contain half fruits/vegetables, one quarter protein, and one quarter carbs (no bigger than a computer mouse)  Cut down on sweet beverages. This includes juice, soda, and sweet tea.   Drink at least 1 glass of water with each meal and aim for at least 8 glasses per day  Exercise at least 150 minutes every week.    Preventive Care 59-32 Years Old, Male Preventive care refers to lifestyle choices and visits with your health care provider that can promote health and wellness. Preventive care visits are also called wellness exams. What can I expect for my preventive care visit? Counseling During your preventive care visit, your health care provider may ask about your: Medical history, including: Past medical problems. Family medical history. Current health, including: Emotional well-being. Home life and relationship well-being. Sexual activity. Lifestyle,  including: Alcohol, nicotine or tobacco, and drug use. Access to firearms. Diet, exercise, and sleep habits. Safety issues such as seatbelt and bike helmet use. Sunscreen use. Work and work Statistician. Physical exam Your health care provider will check your: Height and weight. These may be used to calculate your BMI (body mass index). BMI is a measurement that tells if you are at a healthy weight. Waist circumference. This measures the distance around your waistline. This measurement also tells if you are at a healthy weight and may help predict your risk of certain diseases, such as type 2 diabetes and high blood pressure. Heart rate and blood pressure. Body temperature. Skin for abnormal spots. What immunizations do I need?  Vaccines are usually given at various ages, according to a schedule. Your health care provider will recommend vaccines for you based on your age, medical history, and lifestyle or other factors, such as travel or where you work. What tests do I need? Screening Your health care provider may recommend screening tests for certain conditions. This may include: Lipid and cholesterol levels. Diabetes screening. This is done by checking your blood sugar (glucose) after you have not eaten for a while (fasting). Hepatitis B test. Hepatitis C test. HIV (human immunodeficiency virus) test. STI (sexually transmitted infection) testing, if you are at risk. Lung cancer screening. Prostate cancer screening. Colorectal cancer screening. Talk with your health care provider about your test results, treatment options,  and if necessary, the need for more tests. Follow these instructions at home: Eating and drinking  Eat a diet that includes fresh fruits and vegetables, whole grains, lean protein, and low-fat dairy products. Take vitamin and mineral supplements as recommended by your health care provider. Do not drink alcohol if your health care provider tells you not to  drink. If you drink alcohol: Limit how much you have to 0-2 drinks a day. Know how much alcohol is in your drink. In the [place] one drink equals one 12 oz bottle of beer (355 mL), one 5 oz glass of wine (148 mL), or one 1 oz glass of hard liquor (44 mL). Lifestyle Brush your teeth every morning and night with fluoride toothpaste. Floss one time each day. Exercise for at least 30 minutes 5 or more days each week. Do not use any products that contain nicotine or tobacco. These products include cigarettes, chewing tobacco, and vaping devices, such as e-cigarettes. If you need help quitting, ask your health care provider. Do not use drugs. If you are sexually active, practice safe sex. Use a condom or other form of protection to prevent STIs. Take aspirin only as told by your health care provider. Make sure that you understand how much to take and what form to take. Work with your health care provider to find out whether it is safe and beneficial for you to take aspirin daily. Find healthy ways to manage stress, such as: Meditation, yoga, or listening to music. Journaling. Talking to a trusted person. Spending time with friends and family. Minimize exposure to UV radiation to reduce your risk of skin cancer. Safety Always wear your seat belt while driving or riding in a vehicle. Do not drive: If you have been drinking alcohol. Do not ride with someone who has been drinking. When you are tired or distracted. While texting. If you have been using any mind-altering substances or drugs. Wear a helmet and other protective equipment during sports activities. If you have firearms in your house, make sure you follow all gun safety procedures. What's next? Go to your health care provider once a year for an annual wellness visit. Ask your health care provider how often you should have your eyes and teeth checked. Stay up to date on all vaccines. This information is not intended to replace advice  given to you by your health care provider. Make sure you discuss any questions you have with your health care provider. Document Revised: 10/11/2020 Document Reviewed: 10/11/2020 Elsevier Patient Education  [place].

## 2022-04-17 NOTE — Assessment & Plan Note (Signed)
Overall symptoms are stable.  Usually worse at night when lying flat on his back.  Does note that raising the head of his bed helps.  We did discuss starting medication however he declined.  Symptoms are currently manageable.  He will let me know if symptoms worsen.  We discussed reasons return to care.

## 2022-04-23 NOTE — Progress Notes (Signed)
Please inform patient of the following:  Potassium is a little low but everything else is stable compared to previous years.  Please have him come back to recheck potassium in 1 to 2 weeks.  We can recheck everything else in year.

## 2022-04-24 ENCOUNTER — Other Ambulatory Visit: Payer: Self-pay | Admitting: *Deleted

## 2022-04-24 DIAGNOSIS — E876 Hypokalemia: Secondary | ICD-10-CM

## 2022-05-01 ENCOUNTER — Other Ambulatory Visit (INDEPENDENT_AMBULATORY_CARE_PROVIDER_SITE_OTHER): Payer: 59

## 2022-05-01 DIAGNOSIS — E876 Hypokalemia: Secondary | ICD-10-CM | POA: Diagnosis not present

## 2022-05-01 LAB — COMPREHENSIVE METABOLIC PANEL
ALT: 9 U/L (ref 0–53)
AST: 12 U/L (ref 0–37)
Albumin: 4.3 g/dL (ref 3.5–5.2)
Alkaline Phosphatase: 56 U/L (ref 39–117)
BUN: 15 mg/dL (ref 6–23)
CO2: 29 mEq/L (ref 19–32)
Calcium: 8.9 mg/dL (ref 8.4–10.5)
Chloride: 105 mEq/L (ref 96–112)
Creatinine, Ser: 0.89 mg/dL (ref 0.40–1.50)
GFR: 94.01 mL/min (ref >60.00)
Glucose, Bld: 91 mg/dL (ref 70–99)
Potassium: 3.3 mEq/L — ABNORMAL LOW (ref 3.5–5.1)
Sodium: 144 mEq/L (ref 135–145)
Total Bilirubin: 0.6 mg/dL (ref 0.2–1.2)
Total Protein: 6.7 g/dL (ref 6.0–8.3)

## 2022-05-02 NOTE — Progress Notes (Signed)
Please inform patient of the following:  Potassium is still low. This is probably due to his blood pressure medication. Recommend that he stop his combination losartan-hctz. Please send in losartan '100mg'$  daily which should replace his combination medication. I would like to see him back in the office within 1-2 weeks to recheck his blood pressure and make sure his potassium is improving.

## 2022-05-06 ENCOUNTER — Other Ambulatory Visit: Payer: Self-pay | Admitting: *Deleted

## 2022-05-06 MED ORDER — LOSARTAN POTASSIUM 100 MG PO TABS: 100.0000 mg | ORAL_TABLET | Freq: Every day | ORAL | 0 refills | Status: DC

## 2022-05-15 ENCOUNTER — Ambulatory Visit: Payer: 59 | Admitting: Family Medicine

## 2022-05-15 ENCOUNTER — Encounter: Payer: Self-pay | Admitting: Family Medicine

## 2022-05-15 VITALS — BP 134/78 | HR 75 | Temp 98.2°F | Ht 76.0 in | Wt 288.0 lb

## 2022-05-15 DIAGNOSIS — N529 Male erectile dysfunction, unspecified: Secondary | ICD-10-CM | POA: Diagnosis not present

## 2022-05-15 DIAGNOSIS — L985 Mucinosis of the skin: Secondary | ICD-10-CM | POA: Diagnosis not present

## 2022-05-15 DIAGNOSIS — I10 Essential (primary) hypertension: Secondary | ICD-10-CM

## 2022-05-15 LAB — BASIC METABOLIC PANEL
BUN: 13 mg/dL (ref 6–23)
CO2: 30 mEq/L (ref 19–32)
Calcium: 8.8 mg/dL (ref 8.4–10.5)
Chloride: 108 mEq/L (ref 96–112)
Creatinine, Ser: 0.91 mg/dL (ref 0.40–1.50)
GFR: 92.43 mL/min (ref >60.00)
Glucose, Bld: 92 mg/dL (ref 70–99)
Potassium: 3.6 mEq/L (ref 3.5–5.1)
Sodium: 145 mEq/L (ref 135–145)

## 2022-05-15 MED ORDER — TADALAFIL 5 MG PO TABS: 5.0000 mg | ORAL_TABLET | Freq: Every day | ORAL | 3 refills | Status: DC

## 2022-05-15 MED ORDER — LOSARTAN POTASSIUM 100 MG PO TABS: 100.0000 mg | ORAL_TABLET | Freq: Every day | ORAL | 3 refills | Status: DC

## 2022-05-15 NOTE — Assessment & Plan Note (Signed)
Blood pressure at goal today off HCTZ.  Will continue losartan 100 mg daily.  He will continue to monitor at home.

## 2022-05-15 NOTE — Progress Notes (Signed)
   Alexis Norris is a 62 y.o. male who presents today for an office visit.  Assessment/Plan:  New/Acute Problems: Low Back Pain No red flags.  Likely has some underlying degenerative changes that was uncovered when going from 10 mg of prednisone to 5 mg.  History consistent with possible muscular strain.  Has a reassuring exam today.  He can continue using over-the-counter meds and heating pad to the area as needed.  We did discuss referral to PT sports medicine however he deferred.  We discussed reasons to return to care.  Hypokalemia Likely secondary to HCTZ use.  Will recheck be met today.  Chronic Problems Addressed Today: Essential hypertension Blood pressure at goal today off HCTZ.  Will continue losartan 100 mg daily.  He will continue to monitor at home.  Diffuse cutaneous mucinosis Recently saw dermatology for this.  They have been weaning down on prednisone from 10 mg daily to 5 mg daily.  Erectile dysfunction Stable on Cialis 5 mg daily.  Will refill today.     Subjective:  HPI:  See A/p for status of chronic conditions.  Patient was seen here a few weeks ago for annual physical.  At that visit was doing well.  Was incidentally found to have low potassium.  This was confirmed on repeat.  We stopped his HCTZ.  Continued his losartan 100 mg daily.  Blood pressure was initially elevated but seems to be doing better lately.  He has been doing well with this medication change.  His dermatologist also has recently decreased his prednisone from 10 mg daily to 5 mg daily.  Since doing this he has noticed worsening low back pain.  Usually occurs first thing in the morning.  Feels like a muscle spasm.  Usually resolves with heat and taking over-the-counter anti-inflammatories.  No reported bowel or bladder incontinence.  No reported weakness or numbness.  No tingling.       Objective:  Physical Exam: BP 134/78   Pulse 75   Temp 98.2 F (36.8 C) (Temporal)   Ht '6\' 4"'$  (1.93  m)   Wt 288 lb (130.6 kg)   SpO2 98%   BMI 35.06 kg/m   Gen: No acute distress, resting comfortably CV: Regular rate and rhythm with no murmurs appreciated Pulm: Normal work of breathing, clear to auscultation bilaterally with no crackles, wheezes, or rhonchi MSK:  - Back: No deformities.  Nontender to palpation.  Neurovascular intact distally. Neuro: Grossly normal, moves all extremities Psych: Normal affect and thought content      Tahliyah Anagnos M. Jerline Pain, MD 05/15/2022 9:12 AM

## 2022-05-15 NOTE — Patient Instructions (Signed)
It was very nice to see you today!  We will check blood work today.  I am glad your blood pressure looks good.  Please keep an eye on this and let us know if it is persistently 140/90 or higher.  You are probably having a muscle spasm in your back.  Let us know if this worsens.  We will see back when you are due for your annual physical.  Please come back to see [place] sooner if needed.  Take care, Dr Jerline Pain  PLEASE NOTE:  If you had any lab tests, please let us know if you have not heard back within a few days. You may see your results on mychart before we have a chance to review them but we will give you a call once they are reviewed by [place].   If we ordered any referrals today, please let us know if you have not heard from their office within the next week.   If you had any urgent prescriptions sent in today, please check with the pharmacy within an hour of our visit to make sure the prescription was transmitted appropriately.   Please try these tips to maintain a healthy lifestyle:  Eat at least 3 REAL meals and 1-2 snacks per day.  Aim for no more than 5 hours between eating.  If you eat breakfast, please do so within one hour of getting up.   Each meal should contain half fruits/vegetables, one quarter protein, and one quarter carbs (no bigger than a computer mouse)  Cut down on sweet beverages. This includes juice, soda, and sweet tea.   Drink at least 1 glass of water with each meal and aim for at least 8 glasses per day  Exercise at least 150 minutes every week.

## 2022-05-15 NOTE — Assessment & Plan Note (Signed)
Recently saw dermatology for this.  They have been weaning down on prednisone from 10 mg daily to 5 mg daily.

## 2022-05-15 NOTE — Assessment & Plan Note (Signed)
Stable on Cialis 5 mg daily.  Will refill today.

## 2022-05-17 NOTE — Progress Notes (Signed)
Please inform patient of the following:  Potassium back to normal.  Do not need to do any other testing at this point.  He can continue with his current dose of losartan and he should let us know if he has any changes in his home blood pressure readings.

## 2022-06-17 ENCOUNTER — Telehealth: Payer: Self-pay | Admitting: Family Medicine

## 2022-06-17 NOTE — Telephone Encounter (Signed)
He needs to be on just losartan 136m once daily.   They should remove the losartan-hctz from their system.  CAlgis Greenhouse PJerline Pain MD 06/17/2022 3:24 PM

## 2022-06-17 NOTE — Telephone Encounter (Signed)
Patient requests to be called to be advised of which Losartan he needs to be taking:  Patient requests a 90 day supply RX for either the losartan-hydrochlorothiazide 100-25 MG tablet  or the  losartan (COZAAR) 100 MG tablet  be sent to  [place], [place] to [facility] [place] [facility] Phone: [phone]  Fax: [phone]     However, patient requests to discuss if he should take his old RX of Losartan while waiting for Mail Delivery service from [place] states he is totally out of high blood pressure medication.   And has been taking his old RX

## 2022-06-17 NOTE — Telephone Encounter (Signed)
Please advise 

## 2022-06-18 NOTE — Telephone Encounter (Signed)
Left message to return call to our office at their convenience.  

## 2022-06-18 NOTE — Telephone Encounter (Signed)
Pt returning call to cma. States has been taking old Losartan rx due to being out at this time. Asking to call [facility] mail order with correct rx.

## 2022-06-19 ENCOUNTER — Other Ambulatory Visit: Payer: Self-pay | Admitting: *Deleted

## 2022-06-19 MED ORDER — LOSARTAN POTASSIUM 100 MG PO TABS: 100.0000 mg | ORAL_TABLET | Freq: Every day | ORAL | 0 refills | Status: DC

## 2022-06-19 MED ORDER — LOSARTAN POTASSIUM 100 MG PO TABS: 100.0000 mg | ORAL_TABLET | Freq: Every day | ORAL | 3 refills | Status: DC

## 2022-06-19 NOTE — Telephone Encounter (Signed)
Rx send to [facility] mail order pharmacy

## 2022-06-27 ENCOUNTER — Other Ambulatory Visit: Payer: Self-pay | Admitting: Family Medicine

## 2022-06-27 NOTE — Telephone Encounter (Signed)
He is only on losartan 100 mg daily.  We stopped his HCTZ several months ago.  Please remember the combination pill from the [facility].

## 2022-06-27 NOTE — Telephone Encounter (Signed)
Pharmacy comment: please verify current therapy for your patient. LOSARTAN '100MG'$  TAB OR LOSARTAN/HCTZ 100-'25MG'$  TAB ? PATIENT IS CURRENTLY TAKING LOSARTAN/HCTZ 100-'25MG'$ . please respond with appropriate changes or comment to pharmacy. please verify current therapy for your patient. LOSARTAN '100MG'$  TAB OR LOSARTAN/HCTZ 100-'25MG'$  TAB ? PATIENT IS CURRENTLY TAKING LOSARTAN/HCTZ 100-'25MG'$ . please respond with appropriate changes or comment to pharm.

## 2022-07-01 ENCOUNTER — Other Ambulatory Visit: Payer: Self-pay | Admitting: *Deleted

## 2022-07-01 ENCOUNTER — Telehealth: Payer: Self-pay | Admitting: Family Medicine

## 2022-07-01 DIAGNOSIS — M545 Low back pain, unspecified: Secondary | ICD-10-CM

## 2022-07-01 NOTE — Telephone Encounter (Signed)
Referral placed.

## 2022-07-01 NOTE — Telephone Encounter (Signed)
Patient states he would like to be referred to specialist as suggested by PCP following OV on 05/15/22.

## 2022-07-02 NOTE — Progress Notes (Unsigned)
    Benito Mccreedy D.[place] Phone: [phone]   Assessment and Plan:     There are no diagnoses linked to this encounter.  ***   Pertinent previous records reviewed include ***   Follow Up: ***     Subjective:   I, Talley Casco, am serving as a Education administrator for Doctor Glennon Mac  Chief Complaint: low back pain   HPI:   07/03/2022 Patient is a 62 year old male complaining of low back pain. Patient states  Relevant Historical Information: ***  Additional pertinent review of systems negative.   Current Outpatient Medications:    allopurinol (ZYLOPRIM) 100 MG tablet, TAKE 1 TABLET DAILY, Disp: 90 tablet, Rfl: 3   Colchicine 0.6 MG CAPS, Day 1: Take 2 caps, then 1 cap an hour later. Day 2 and beyond: 1 cap daily, Disp: 30 capsule, Rfl: 0   levothyroxine (SYNTHROID) 125 MCG tablet, TAKE 2 TABLETS DAILY BEFOREBREAKFAST, Disp: 180 tablet, Rfl: 0   losartan (COZAAR) 100 MG tablet, TAKE 1 TABLET DAILY., Disp: 100 tablet, Rfl: 3   predniSONE (DELTASONE) 5 MG tablet, Take 5 mg by mouth daily., Disp: , Rfl:    tadalafil (CIALIS) 5 MG tablet, Take 1 tablet (5 mg total) by mouth daily., Disp: 90 tablet, Rfl: 3   Objective:     There were no vitals filed for this visit.    There is no height or weight on file to calculate BMI.    Physical Exam:    ***   Electronically signed by:  Benito Mccreedy D.[facility] 3:17 PM 07/02/22

## 2022-07-03 ENCOUNTER — Ambulatory Visit (INDEPENDENT_AMBULATORY_CARE_PROVIDER_SITE_OTHER): Payer: 59

## 2022-07-03 ENCOUNTER — Ambulatory Visit: Payer: 59 | Admitting: Sports Medicine

## 2022-07-03 VITALS — BP 130/82 | HR 97 | Ht 76.0 in | Wt 288.0 lb

## 2022-07-03 DIAGNOSIS — M5136 Other intervertebral disc degeneration, lumbar region: Secondary | ICD-10-CM | POA: Diagnosis not present

## 2022-07-03 DIAGNOSIS — G8929 Other chronic pain: Secondary | ICD-10-CM

## 2022-07-03 DIAGNOSIS — M545 Low back pain, unspecified: Secondary | ICD-10-CM

## 2022-07-03 DIAGNOSIS — M109 Gout, unspecified: Secondary | ICD-10-CM | POA: Diagnosis not present

## 2022-07-03 LAB — COMPREHENSIVE METABOLIC PANEL
ALT: 13 U/L (ref 0–53)
AST: 14 U/L (ref 0–37)
Albumin: 4.4 g/dL (ref 3.5–5.2)
Alkaline Phosphatase: 69 U/L (ref 39–117)
BUN: 14 mg/dL (ref 6–23)
CO2: 26 mEq/L (ref 19–32)
Calcium: 9.4 mg/dL (ref 8.4–10.5)
Chloride: 103 mEq/L (ref 96–112)
Creatinine, Ser: 0.92 mg/dL (ref 0.40–1.50)
GFR: 91.14 mL/min (ref >60.00)
Glucose, Bld: 84 mg/dL (ref 70–99)
Potassium: 3.1 mEq/L — ABNORMAL LOW (ref 3.5–5.1)
Sodium: 140 mEq/L (ref 135–145)
Total Bilirubin: 0.7 mg/dL (ref 0.2–1.2)
Total Protein: 7.7 g/dL (ref 6.0–8.3)

## 2022-07-03 LAB — CBC WITH DIFFERENTIAL/PLATELET
Basophils Absolute: 0.1 10*3/uL (ref 0.0–0.1)
Basophils Relative: 0.6 % (ref 0.0–3.0)
Eosinophils Absolute: 0.3 10*3/uL (ref 0.0–0.7)
Eosinophils Relative: 2.7 % (ref 0.0–5.0)
HCT: 41.1 % (ref 39.0–52.0)
Hemoglobin: 14.2 g/dL (ref 13.0–17.0)
Lymphocytes Relative: 17.7 % (ref 12.0–46.0)
Lymphs Abs: 1.7 10*3/uL (ref 0.7–4.0)
MCHC: 34.5 g/dL (ref 30.0–36.0)
MCV: 81.3 fl (ref 78.0–100.0)
Monocytes Absolute: 0.6 10*3/uL (ref 0.1–1.0)
Monocytes Relative: 5.8 % (ref 3.0–12.0)
Neutro Abs: 7 10*3/uL (ref 1.4–7.7)
Neutrophils Relative %: 73.2 % (ref 43.0–77.0)
Platelets: 250 10*3/uL (ref 150.0–400.0)
RBC: 5.06 Mil/uL (ref 4.22–5.81)
RDW: 14.4 % (ref 11.5–15.5)
WBC: 9.5 10*3/uL (ref 4.0–10.5)

## 2022-07-03 LAB — URIC ACID: Uric Acid, Serum: 5.3 mg/dL (ref 4.0–7.8)

## 2022-07-03 LAB — VITAMIN D 25 HYDROXY (VIT D DEFICIENCY, FRACTURES): VITD: 7.93 ng/mL — ABNORMAL LOW (ref 30.00–100.00)

## 2022-07-03 LAB — SEDIMENTATION RATE: Sed Rate: 44 mm/hr — ABNORMAL HIGH (ref 0–20)

## 2022-07-03 LAB — C-REACTIVE PROTEIN: CRP: <1 mg/dL (ref 0.5–20.0)

## 2022-07-03 LAB — TSH: TSH: 5.94 u[IU]/mL — ABNORMAL HIGH (ref 0.35–5.50)

## 2022-07-03 LAB — FERRITIN: Ferritin: 42 ng/mL (ref 22.0–322.0)

## 2022-07-03 MED ORDER — MELOXICAM 15 MG PO TABS: 15.0000 mg | ORAL_TABLET | Freq: Every day | ORAL | 0 refills | Status: DC

## 2022-07-03 MED ORDER — CYCLOBENZAPRINE HCL 5 MG PO TABS: 5.0000 mg | ORAL_TABLET | Freq: Every day | ORAL | 0 refills | Status: DC

## 2022-07-03 NOTE — Patient Instructions (Addendum)
Good to see you - Start meloxicam 15 mg daily x2 weeks.  If still having pain after 2 weeks, complete 3rd-week of meloxicam. May use remaining meloxicam as needed once daily for pain control.  Do not to use additional NSAIDs while taking meloxicam.  May use Tylenol [phone] mg 2 to 3 times a day for breakthrough pain. Labs on the way out Stop taking allopurinol  Flexeril 5-10 mg nightly as needed for muscle spasms PT referral  Low back HEP 3-4 week follow up

## 2022-07-06 LAB — RHEUMATOID FACTOR: Rheumatoid fact SerPl-aCnc: <14 IU/mL (ref <14)

## 2022-07-06 LAB — CYCLIC CITRUL PEPTIDE ANTIBODY, IGG: Cyclic Citrullin Peptide Ab: <16 UNITS

## 2022-07-06 LAB — ANA: Anti Nuclear Antibody (ANA): NEGATIVE

## 2022-07-06 LAB — HLA-B27 ANTIGEN: HLA-B27 Antigen: POSITIVE — AB

## 2022-07-09 ENCOUNTER — Other Ambulatory Visit: Payer: Self-pay | Admitting: Sports Medicine

## 2022-07-09 DIAGNOSIS — M109 Gout, unspecified: Secondary | ICD-10-CM

## 2022-07-09 DIAGNOSIS — G8929 Other chronic pain: Secondary | ICD-10-CM

## 2022-07-09 DIAGNOSIS — M5136 Other intervertebral disc degeneration, lumbar region: Secondary | ICD-10-CM

## 2022-07-09 DIAGNOSIS — R7989 Other specified abnormal findings of blood chemistry: Secondary | ICD-10-CM

## 2022-07-09 NOTE — Progress Notes (Unsigned)
could we contact patient to make him aware of his results. He had multiple findings including a low potassium and elevated TSH. Recommend discussing these further with his primary care physician. He had a low vitamin D level. Can start vitamin D 400 to 600 IU daily. 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 This could contribute to back pain and can be seen in a condition called ankylosing spondylitis. Recommend further evaluation with Rheumatology. Can we please place a rheumatology referral. I would be, happy to discuss results in more detail At patients follow up visit.

## 2022-07-10 ENCOUNTER — Telehealth: Payer: Self-pay | Admitting: Family Medicine

## 2022-07-10 NOTE — Telephone Encounter (Signed)
Pt would like a call back with lab results. 

## 2022-07-11 NOTE — Telephone Encounter (Signed)
See note

## 2022-07-11 NOTE — Telephone Encounter (Signed)
We did not order labs - this needs to go to the sports medicine office.  Alexis Norris. Jerline Pain, MD 07/11/2022 3:19 PM

## 2022-07-11 NOTE — Telephone Encounter (Signed)
Spoke with patient, stated has OV with sport medicine Dr  Dixie Dials for PCP to be aware of results, will schedule OV with PCP if needed

## 2022-07-12 ENCOUNTER — Encounter: Payer: Self-pay | Admitting: Physical Therapy

## 2022-07-12 ENCOUNTER — Ambulatory Visit: Payer: 59 | Attending: Sports Medicine | Admitting: Physical Therapy

## 2022-07-12 ENCOUNTER — Other Ambulatory Visit: Payer: Self-pay

## 2022-07-12 DIAGNOSIS — M6281 Muscle weakness (generalized): Secondary | ICD-10-CM

## 2022-07-12 DIAGNOSIS — M5459 Other low back pain: Secondary | ICD-10-CM

## 2022-07-12 DIAGNOSIS — M545 Low back pain, unspecified: Secondary | ICD-10-CM | POA: Insufficient documentation

## 2022-07-12 DIAGNOSIS — G8929 Other chronic pain: Secondary | ICD-10-CM | POA: Insufficient documentation

## 2022-07-12 NOTE — Patient Instructions (Signed)
Access Code: [email or web address] URL: [email or web address] Date: 07/12/2022 Prepared by: Hilda Blades  Exercises - Supine Lower Trunk Rotation  - 2 x daily - 10 reps - Supine Piriformis Stretch with Foot on Ground  - 2 x daily - 3 reps - 30 seconds hold - Bridge  - 1 x daily - 3 sets - 5 reps - 1-2 seconds hold - Straight Leg Raise  - 1 x daily - 3 sets - 5 reps - Seated Hamstring Stretch  - 2 x daily - 3 reps - 30 seconds hold

## 2022-07-12 NOTE — Therapy (Signed)
OUTPATIENT PHYSICAL THERAPY EVALUATION   Patient Name: Alexis Norris MRN: [id number] DOB:11-30-1960, 62 y.o., male Today's Date: 07/12/2022   END OF SESSION:  PT End of Session - 07/12/22 1239     Visit Number 1    Number of Visits 9    Date for PT Re-Evaluation 09/06/22    Authorization Type [employer]    PT Start Time 1230    PT Stop Time 1314    PT Time Calculation (min) 44 min    Activity Tolerance Patient tolerated treatment well    Behavior During Therapy [facility] for tasks assessed/performed             Past Medical History:  Diagnosis Date   DYSLIPIDEMIA 01/13/2007   no medicines    HYPERTENSION 01/13/2007   HYPOTHYROIDISM, 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   Past Surgical History:  Procedure Laterality Date   I-131 therapy  09/1997   maybe 2000-2001   INGUINAL HERNIA REPAIR Right 09/28/2013   Procedure: LAPAROSCOPIC RIGHT  INGUINAL HERNIA;  Surgeon: Ralene Ok, MD;  Location: [place];  Service: General;  Laterality: Right;   INSERTION OF MESH Right 09/28/2013   Procedure: INSERTION OF MESH;  Surgeon: Ralene Ok, MD;  Location: [place];  Service: General;  Laterality: Right;   right knuckle     pin placed in high school   Patient Active Problem List   Diagnosis Date Noted   Diverticulosis 04/17/2022   GERD (gastroesophageal reflux disease) 04/17/2022   Erectile dysfunction 04/11/2021   Diffuse cutaneous mucinosis 04/10/2020   Gout 02/06/2016   Allergic rhinitis, cause unspecified 08/26/2011   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   Dyslipidemia 01/13/2007   Essential hypertension 01/13/2007    PCP: Vivi Barrack, MD  REFERRING PROVIDER: Glennon Mac, DO  REFERRING DIAG: Low back pain, unspecified back pain laterality, unspecified chronicity, unspecified whether sciatica present  Rationale for Evaluation and Treatment: Rehabilitation  THERAPY DIAG:  Other low back pain  Muscle weakness (generalized)  ONSET DATE: November-December  of 2023   SUBJECTIVE:                                                                                                                                                                                          SUBJECTIVE STATEMENT: Patient reports low back pain that began in late November-December of 2023 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 He states the pain continued to get worse so went and saw the doctor who provided him medication which has helped. He does continue to have pain but is no longer having muscle spasms. 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 Sitting or driving will cause low back pain, and he has to careful how he moves in the morning as well as getting out of bed.   PERTINENT HISTORY:  See PMH above  PAIN:  Are you having pain? Yes:  NPRS scale: 2-3/10 Pain location: Low back Pain description: Ache, spasm Aggravating factors: Sitting, driving, moving in the morning Relieving factors: Tylenol  PRECAUTIONS: None  WEIGHT BEARING RESTRICTIONS: No  FALLS:  Has patient fallen in last 6 months? No  OCCUPATION: Mail carrier, mostly driving in truck, getting in/out of truck, carry packages, standing on concrete  PLOF: Independent  PATIENT GOALS: Pain relief   OBJECTIVE:  DIAGNOSTIC FINDINGS:  X-ray lumbar 3/6/2024L FINDINGS: There is no evidence of lumbar spine fracture. Alignment is normal. Intervertebral disc spaces are maintained. Large amount of osteophyte formation is noted extending from L2-3 to L5-S1. IMPRESSION: Multilevel degenerative changes as described above. No acute abnormality seen.  PATIENT SURVEYS:  FOTO 52% functional status  SCREENING FOR RED FLAGS: Negative  COGNITION: Overall cognitive status: Within functional limits for tasks  assessed     SENSATION: WFL  MUSCLE LENGTH: Flexibility deficit of bilateral hamstrings and hips  POSTURE:   Loss of lumbar lordosis  PALPATION: Mildly tender to lumbar paraspinals, CPA hypomobility and discomfort  LUMBAR ROM:   AROM eval  Flexion 50% - hamstring tightness  Extension < 25%  Right lateral flexion 25%  Left lateral flexion 50%  Right rotation 75%  Left rotation 50%   (Blank rows = not tested)  LOWER EXTREMITY MMT:     MMT  Right eval Left eval  Hip flexion 4 4  Hip extension 3 3  Hip abduction 3 3  Hip adduction    Hip internal rotation    Hip external rotation    Knee flexion 5 5  Knee extension 5 5  Ankle dorsiflexion    Ankle plantarflexion    Ankle inversion    Ankle eversion     (Blank rows = not tested)  LUMBAR SPECIAL TESTS:  Lumbar radicular testing negative  FUNCTIONAL TESTS:  Unable to hold DLLT due to core weakness  GAIT: Assistive device utilized: None Level of assistance: Complete Independence Comments: WFL   TODAY'S TREATMENT:     [facility] Adult PT Treatment:                                                DATE: 07/12/2022 Therapeutic Exercise: LTR x 5 Piriformis stretch 2 x 30 sec each Bridge x 5 SLR with abdominal engagement x 5 each Seated hamstring stretch 2 x 30 sec each  PATIENT EDUCATION:  Education details: Exam findings, POC, HEP Person educated: Patient Education method: Explanation, Demonstration, Tactile cues, Verbal cues, and Handouts Education comprehension: verbalized understanding, returned demonstration, verbal cues required, tactile cues required, and needs further education  HOME EXERCISE PROGRAM: Access Code: [email or web address]    ASSESSMENT: CLINICAL IMPRESSION: Patient is a 62 y.o. male who was seen today for physical therapy evaluation and treatment for chronic low back pain without radiculopathy. He demonstrates limitations in lumbar mobility and motion, with flexibility deficit of the hamstring and  hips, and gross strength deficit of the  core and hip musculature likely contributing to pain and impacting his functional ability.    OBJECTIVE IMPAIRMENTS: decreased activity tolerance, decreased ROM, decreased strength, impaired flexibility, postural dysfunction, and pain.   ACTIVITY LIMITATIONS: carrying, lifting, bending, sitting, standing, bed mobility, and locomotion level  PARTICIPATION LIMITATIONS: driving and occupation  PERSONAL FACTORS: Fitness, Past/current experiences, and Time since onset of injury/illness/exacerbation are also affecting patient's functional outcome.   REHAB POTENTIAL: Good  CLINICAL DECISION MAKING: Stable/uncomplicated  EVALUATION COMPLEXITY: Low   GOALS: Goals reviewed with patient? Yes  SHORT TERM GOALS: Target date: 08/09/2022  Patient will be I with initial HEP in order to progress with therapy. Baseline: HEP provided at eval Goal status: INITIAL  2.  PT will review FOTO with patient by 3rd visit in order to understand expected progress and outcome with therapy. Baseline:  Goal status: INITIAL  LONG TERM GOALS: Target date: 09/06/2022  Patient will be I with final HEP to maintain progress from PT. Baseline: HEP provided at eval Goal status: INITIAL  2.  Patient will report >/= 64% status on FOTO to indicate improved functional ability. Baseline: 52% functional status Goal status: INITIAL  3.  Patient will demonstrate DLLT to 45 deg in order to indicate improve core strength and lumbopelvic control to improve lifting ability and decrease pain Baseline: unable to perform DLLT Goal status: INITIAL  4.  Patient will demonstrate hip strength grossly >/= 4/5 MMT in order to improve tolerance for standing and getting in/out of work truck Baseline: 3/5 MMT Goal status: INITIAL  5. Patient will report low back pain </= 1/10 pain in order to reduce functional limitations with work related tasks  Baseline: 2-3/10 pain  Goal status:  INITIAL  6. Patient will exhibit a 25% improvement in lumbar AROM in order to improve bed mobility and reduce tension with movement in the morning  Baseline: see limitations in lumbar AROM above  Goal status: INITIAL   PLAN: PT FREQUENCY: 1x/week  PT DURATION: 8 weeks  PLANNED INTERVENTIONS: Therapeutic exercises, Therapeutic activity, Neuromuscular re-education, Balance training, Gait training, Patient/Family education, Self Care, Joint mobilization, Joint manipulation, Aquatic Therapy, Dry Needling, Electrical stimulation, Spinal manipulation, Spinal mobilization, Cryotherapy, Moist heat, Manual therapy, and Re-evaluation.  PLAN FOR NEXT SESSION: Review HEP and progress PRN, manual/dry needling for lumbar paraspinals, continue with lumbar and hip/hamstring stretching, progress core stabilization and hip strengthening, lifting mechanics   Hilda Blades, PT, DPT, LAT, ATC 07/12/22  2:29 PM Phone: [phone] Fax: [phone]

## 2022-07-23 NOTE — Progress Notes (Unsigned)
    Benito Mccreedy D.[facility] Phone: [phone]   Assessment and Plan:     There are no diagnoses linked to this encounter.  ***   Pertinent previous records reviewed include ***   Follow Up: ***     Subjective:   I, Yakir Wenke, am serving as a Education administrator for Doctor Glennon Mac   Chief Complaint: low back pain    HPI:    07/03/2022 Patient is a 62 year old male complaining of low back pain. Patient states that pain started in December , felt like his lower back would spasm, pain has progressively gotten worst, advil helps, pain is radiating down his hamstring and butt, he feels the pain in his spine, no numbness or tingling, intermittent pain when he walks ,   07/24/2022 Patient states   Relevant Historical Information: Hypertension, GERD, hypothyroidism  Additional pertinent review of systems negative.   Current Outpatient Medications:    allopurinol (ZYLOPRIM) 100 MG tablet, TAKE 1 TABLET DAILY, Disp: 90 tablet, Rfl: 3   Colchicine 0.6 MG CAPS, Day 1: Take 2 caps, then 1 cap an hour later. Day 2 and beyond: 1 cap daily, Disp: 30 capsule, Rfl: 0   cyclobenzaprine (FLEXERIL) 5 MG tablet, Take 1 tablet (5 mg total) by mouth at bedtime., Disp: 30 tablet, Rfl: 0   levothyroxine (SYNTHROID) 125 MCG tablet, TAKE 2 TABLETS DAILY BEFOREBREAKFAST, Disp: 180 tablet, Rfl: 0   losartan (COZAAR) 100 MG tablet, TAKE 1 TABLET DAILY., Disp: 100 tablet, Rfl: 3   meloxicam (MOBIC) 15 MG tablet, Take 1 tablet (15 mg total) by mouth daily., Disp: 30 tablet, Rfl: 0   predniSONE (DELTASONE) 5 MG tablet, Take 5 mg by mouth daily., Disp: , Rfl:    tadalafil (CIALIS) 5 MG tablet, Take 1 tablet (5 mg total) by mouth daily., Disp: 90 tablet, Rfl: 3   Objective:     There were no vitals filed for this visit.    There is no height or weight on file to calculate BMI.    Physical Exam:    ***   Electronically  signed by:  Benito Mccreedy D.[facility] 7:27 AM 07/23/22

## 2022-07-24 ENCOUNTER — Ambulatory Visit: Payer: 59 | Admitting: Sports Medicine

## 2022-07-24 ENCOUNTER — Telehealth: Payer: Self-pay | Admitting: Family Medicine

## 2022-07-24 ENCOUNTER — Ambulatory Visit: Payer: 59

## 2022-07-24 VITALS — BP 142/82 | HR 90 | Ht 76.0 in | Wt 289.0 lb

## 2022-07-24 DIAGNOSIS — M545 Low back pain, unspecified: Secondary | ICD-10-CM | POA: Diagnosis not present

## 2022-07-24 DIAGNOSIS — Z1589 Genetic susceptibility to other disease: Secondary | ICD-10-CM | POA: Diagnosis not present

## 2022-07-24 DIAGNOSIS — M5459 Other low back pain: Secondary | ICD-10-CM | POA: Diagnosis not present

## 2022-07-24 DIAGNOSIS — E876 Hypokalemia: Secondary | ICD-10-CM

## 2022-07-24 DIAGNOSIS — G8929 Other chronic pain: Secondary | ICD-10-CM

## 2022-07-24 DIAGNOSIS — M5136 Other intervertebral disc degeneration, lumbar region: Secondary | ICD-10-CM | POA: Diagnosis not present

## 2022-07-24 DIAGNOSIS — E890 Postprocedural hypothyroidism: Secondary | ICD-10-CM

## 2022-07-24 DIAGNOSIS — M6281 Muscle weakness (generalized): Secondary | ICD-10-CM

## 2022-07-24 NOTE — Telephone Encounter (Signed)
Patient states he is due for Colonoscopy. Requests to be advised.

## 2022-07-24 NOTE — Patient Instructions (Signed)
Good to see you Tylenol [phone] mg 2-3 times a day for pain relief  Discontinue daily meloxicam use remainder as needed Continue HEP  4-6 week follow up

## 2022-07-24 NOTE — Therapy (Signed)
OUTPATIENT PHYSICAL THERAPY TREATMENT NOTE   Patient Name: Alexis Norris MRN: [id number] DOB:[birth year], 62 y.o., male Today's Date: 07/24/2022  PCP: Vivi Barrack, MD  REFERRING PROVIDER: Glennon Mac, DO   END OF SESSION:   PT End of Session - 07/24/22 1734     Visit Number 2    Number of Visits 9    Date for PT Re-Evaluation 09/06/22    Authorization Type [employer]    PT Start Time 1745    PT Stop Time 1825    PT Time Calculation (min) 40 min             Past Medical History:  Diagnosis Date   DYSLIPIDEMIA 01/13/2007   no medicines    HYPERTENSION 01/13/2007   HYPOTHYROIDISM, 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   Past Surgical History:  Procedure Laterality Date   I-131 therapy  09/1997   maybe 2000-2001   INGUINAL HERNIA REPAIR Right 09/28/2013   Procedure: LAPAROSCOPIC RIGHT  INGUINAL HERNIA;  Surgeon: Ralene Ok, MD;  Location: [place];  Service: General;  Laterality: Right;   INSERTION OF MESH Right 09/28/2013   Procedure: INSERTION OF MESH;  Surgeon: Ralene Ok, MD;  Location: [place];  Service: General;  Laterality: Right;   right knuckle     pin placed in high school   Patient Active Problem List   Diagnosis Date Noted   Diverticulosis 04/17/2022   GERD (gastroesophageal reflux disease) 04/17/2022   Erectile dysfunction 04/11/2021   Diffuse cutaneous mucinosis 04/10/2020   Gout 02/06/2016   Allergic rhinitis, cause unspecified 08/26/2011   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   Dyslipidemia 01/13/2007   Essential hypertension 01/13/2007    REFERRING DIAG: Low back pain, unspecified back pain laterality, unspecified chronicity, unspecified whether sciatica present   THERAPY DIAG:  Other low back pain  Muscle weakness (generalized)  Rationale for Evaluation and Treatment Rehabilitation  PERTINENT HISTORY: See PMH above   PRECAUTIONS: None  SUBJECTIVE:                                                                                                                                                                                       SUBJECTIVE STATEMENT:  Patient reports that his pain is lower today in his lower back.   PAIN:  Are you having pain? Yes:  NPRS scale: 1-2/10 Pain location: Low back Pain description: Ache, spasm Aggravating factors: Sitting, driving, moving in the morning Relieving factors: Tylenol   OBJECTIVE: (objective measures completed at initial evaluation unless otherwise dated)   DIAGNOSTIC FINDINGS:  X-ray lumbar 3/6/2024L FINDINGS: There is no evidence of lumbar spine fracture. Alignment is normal. Intervertebral disc spaces are maintained. Large  amount of osteophyte formation is noted extending from L2-3 to L5-S1. IMPRESSION: Multilevel degenerative changes as described above. No acute abnormality seen.   PATIENT SURVEYS:  FOTO 52% functional status   SCREENING FOR RED FLAGS: Negative   COGNITION: Overall cognitive status: Within functional limits for tasks assessed                          SENSATION: WFL   MUSCLE LENGTH: Flexibility deficit of bilateral hamstrings and hips   POSTURE:             Loss of lumbar lordosis   PALPATION: Mildly tender to lumbar paraspinals, CPA hypomobility and discomfort   LUMBAR ROM:    AROM eval  Flexion 50% - hamstring tightness  Extension < 25%  Right lateral flexion 25%  Left lateral flexion 50%  Right rotation 75%  Left rotation 50%   (Blank rows = not tested)   LOWER EXTREMITY MMT:      MMT  Right eval Left eval  Hip flexion 4 4  Hip extension 3 3  Hip abduction 3 3  Hip adduction      Hip internal rotation      Hip external rotation      Knee flexion 5 5  Knee extension 5 5  Ankle dorsiflexion      Ankle plantarflexion      Ankle inversion      Ankle eversion       (Blank rows = not tested)   LUMBAR SPECIAL TESTS:  Lumbar radicular testing negative   FUNCTIONAL TESTS:  Unable to hold DLLT due to  core weakness   GAIT: Assistive device utilized: None Level of assistance: Complete Independence Comments: WFL     TODAY'S TREATMENT:     [facility] Adult PT Treatment:                                                DATE: 07/24/22 Therapeutic Exercise: Nustep level 5 x 5 mins Palloff press 10# 2x10 BIL Sidestepping at counter YTB at ankles x4 laps  Standing hip abduction/extension YTB at ankles 2x10 BIL Supine 90/90 isometric hold 3x10" Supine figure 4 piriformis stretch 2x30" BIL Prone on elbows x2' Prone press ups 2x10  LTR x10 Sidelying openbooks x10 BIL    [facility] Adult PT Treatment:                                                DATE: 07/12/2022 Therapeutic Exercise: LTR x 5 Piriformis stretch 2 x 30 sec each Bridge x 5 SLR with abdominal engagement x 5 each Seated hamstring stretch 2 x 30 sec each   PATIENT EDUCATION:  Education details: Exam findings, POC, HEP Person educated: Patient Education method: Explanation, Demonstration, Tactile cues, Verbal cues, and Handouts Education comprehension: verbalized understanding, returned demonstration, verbal cues required, tactile cues required, and needs further education   HOME EXERCISE PROGRAM: Access Code: [email or web address]      ASSESSMENT: CLINICAL IMPRESSION: Patient presents to first follow up PT session reporting continued, though lessened, lower back pain and states he has not yet been compliant with his HEP. Session today focused on core and proximal hip strengthening as well as  lumbar mobility. He is mostly limited in lumbar extension, prone on elbows more challenging. Patient was able to tolerate all prescribed exercises with no adverse effects. Patient continues to benefit from skilled PT services and should be progressed as able to improve functional independence.       OBJECTIVE IMPAIRMENTS: decreased activity tolerance, decreased ROM, decreased strength, impaired flexibility, postural dysfunction, and pain.    ACTIVITY  LIMITATIONS: carrying, lifting, bending, sitting, standing, bed mobility, and locomotion level   PARTICIPATION LIMITATIONS: driving and occupation   PERSONAL FACTORS: Fitness, Past/current experiences, and Time since onset of injury/illness/exacerbation are also affecting patient's functional outcome.    REHAB POTENTIAL: Good   CLINICAL DECISION MAKING: Stable/uncomplicated   EVALUATION COMPLEXITY: Low     GOALS: Goals reviewed with patient? Yes   SHORT TERM GOALS: Target date: 08/09/2022   Patient will be I with initial HEP in order to progress with therapy. Baseline: HEP provided at eval Goal status: INITIAL   2.  PT will review FOTO with patient by 3rd visit in order to understand expected progress and outcome with therapy. Baseline:  Goal status: INITIAL   LONG TERM GOALS: Target date: 09/06/2022   Patient will be I with final HEP to maintain progress from PT. Baseline: HEP provided at eval Goal status: INITIAL   2.  Patient will report >/= 64% status on FOTO to indicate improved functional ability. Baseline: 52% functional status Goal status: INITIAL   3.  Patient will demonstrate DLLT to 45 deg in order to indicate improve core strength and lumbopelvic control to improve lifting ability and decrease pain Baseline: unable to perform DLLT Goal status: INITIAL   4.  Patient will demonstrate hip strength grossly >/= 4/5 MMT in order to improve tolerance for standing and getting in/out of work truck Baseline: 3/5 MMT Goal status: INITIAL   5. Patient will report low back pain </= 1/10 pain in order to reduce functional limitations with work related tasks            Baseline: 2-3/10 pain            Goal status: INITIAL   6. Patient will exhibit a 25% improvement in lumbar AROM in order to improve bed mobility and reduce tension with movement in the morning            Baseline: see limitations in lumbar AROM above            Goal status: INITIAL     PLAN: PT  FREQUENCY: 1x/week   PT DURATION: 8 weeks   PLANNED INTERVENTIONS: Therapeutic exercises, Therapeutic activity, Neuromuscular re-education, Balance training, Gait training, Patient/Family education, Self Care, Joint mobilization, Joint manipulation, Aquatic Therapy, Dry Needling, Electrical stimulation, Spinal manipulation, Spinal mobilization, Cryotherapy, Moist heat, Manual therapy, and Re-evaluation.   PLAN FOR NEXT SESSION: Review HEP and progress PRN, manual/dry needling for lumbar paraspinals, continue with lumbar and hip/hamstring stretching, progress core stabilization and hip strengthening, lifting mechanics   Margarette Canada, PTA 07/24/2022, 5:35 PM

## 2022-07-25 NOTE — Telephone Encounter (Signed)
Pt scheduled for appt on 08/01/22

## 2022-07-30 NOTE — Therapy (Signed)
OUTPATIENT PHYSICAL THERAPY TREATMENT NOTE   Patient Name: Alexis Norris MRN: [id number] DOB:Feb 14, 1961, 62 y.o., male Today's Date: 07/31/2022   PCP: Vivi Barrack, MD  REFERRING PROVIDER: Glennon Mac, DO    END OF SESSION:   PT End of Session - 07/31/22 1314     Visit Number 3    Number of Visits 9    Date for PT Re-Evaluation 09/06/22    Authorization Type [employer]    PT Start Time 1315    PT Stop Time 1400    PT Time Calculation (min) 45 min    Activity Tolerance Patient tolerated treatment well    Behavior During Therapy [facility] for tasks assessed/performed              Past Medical History:  Diagnosis Date   DYSLIPIDEMIA 01/13/2007   no medicines    HYPERTENSION 01/13/2007   HYPOTHYROIDISM, 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   Past Surgical History:  Procedure Laterality Date   I-131 therapy  09/1997   maybe 2000-2001   INGUINAL HERNIA REPAIR Right 09/28/2013   Procedure: LAPAROSCOPIC RIGHT  INGUINAL HERNIA;  Surgeon: Ralene Ok, MD;  Location: [place];  Service: General;  Laterality: Right;   INSERTION OF MESH Right 09/28/2013   Procedure: INSERTION OF MESH;  Surgeon: Ralene Ok, MD;  Location: [place];  Service: General;  Laterality: Right;   right knuckle     pin placed in high school   Patient Active Problem List   Diagnosis Date Noted   Diverticulosis 04/17/2022   GERD (gastroesophageal reflux disease) 04/17/2022   Erectile dysfunction 04/11/2021   Diffuse cutaneous mucinosis 04/10/2020   Gout 02/06/2016   Allergic rhinitis, cause unspecified 08/26/2011   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   Dyslipidemia 01/13/2007   Essential hypertension 01/13/2007    REFERRING DIAG: Low back pain, unspecified back pain laterality, unspecified chronicity, unspecified whether sciatica present   THERAPY DIAG:  Other low back pain  Muscle weakness (generalized)  Rationale for Evaluation and Treatment Rehabilitation  PERTINENT HISTORY:  See PMH above   PRECAUTIONS: None   SUBJECTIVE:                                                                                                                                                                                     SUBJECTIVE STATEMENT:  Patient reports it is going good, he can tell if he does a sudden movement then he will get a pull in his back. He is not working today so has not had to push it. He did take a Tylenol earlier today.  PAIN:  Are you having pain? Yes:  NPRS scale: 1-2/10 Pain location: Low back Pain  description: Ache, spasm Aggravating factors: Sitting, driving, moving in the morning Relieving factors: Tylenol   OBJECTIVE: (objective measures completed at initial evaluation unless otherwise dated) DIAGNOSTIC FINDINGS:  X-ray lumbar 3/6/2024L FINDINGS: There is no evidence of lumbar spine fracture. Alignment is normal. Intervertebral disc spaces are maintained. Large amount of osteophyte formation is noted extending from L2-3 to L5-S1. IMPRESSION: Multilevel degenerative changes as described above. No acute abnormality seen.   PATIENT SURVEYS:  FOTO 52% functional status   MUSCLE LENGTH: Flexibility deficit of bilateral hamstrings and hips   POSTURE:             Loss of lumbar lordosis   PALPATION: Mildly tender to lumbar paraspinals, CPA hypomobility and discomfort   LUMBAR ROM:    AROM eval  Flexion 50% - hamstring tightness  Extension < 25%  Right lateral flexion 25%  Left lateral flexion 50%  Right rotation 75%  Left rotation 50%   (Blank rows = not tested)   LOWER EXTREMITY MMT:      MMT  Right eval Left eval Rt / Lt 07/31/2022  Hip flexion 4 4   Hip extension 3 3   Hip abduction 3 3 3+ / 3+  Hip adduction       Hip internal rotation       Hip external rotation       Knee flexion 5 5   Knee extension 5 5   Ankle dorsiflexion       Ankle plantarflexion       Ankle inversion       Ankle eversion        (Blank rows = not  tested)   LUMBAR SPECIAL TESTS:  Lumbar radicular testing negative   FUNCTIONAL TESTS:  Unable to hold DLLT due to core weakness   GAIT: Assistive device utilized: None Level of assistance: Complete Independence Comments: WFL     TODAY'S TREATMENT:     [facility] Adult PT Treatment:                                                DATE: 07/31/22 Therapeutic Exercise: NuStep L7 x 5 min with UE/LE while taking subjective Seated hamstring stretch 2 x 20 sec each Supine piriformis stretch 2 x 20 sec LTR x 10 Modified thomas stretch 2 x 30 sec each Bridge 2 x 10 Hooklying abdominal activation with march x 10 Hooklying abdominal activation with alternating leg extension x 10 90-90 alternating bent knee lift with hold x 10 Sit to stand hold 10# with focus on hip hinge 2 x 10   [facility] Adult PT Treatment:                                                DATE: 07/24/22 Therapeutic Exercise: Nustep level 5 x 5 mins Palloff press 10# 2x10 BIL Sidestepping at counter YTB at ankles x4 laps  Standing hip abduction/extension YTB at ankles 2x10 BIL Supine 90/90 isometric hold 3x10" Supine figure 4 piriformis stretch 2x30" BIL Prone on elbows x2' Prone press ups 2x10  LTR x10 Sidelying openbooks x10 BIL  [facility] Adult PT Treatment:  DATE: 07/12/2022 Therapeutic Exercise: LTR x 5 Piriformis stretch 2 x 30 sec each Bridge x 5 SLR with abdominal engagement x 5 each Seated hamstring stretch 2 x 30 sec each   PATIENT EDUCATION:  Education details: HEP update Person educated: Patient Education method: Explanation, Demonstration, Tactile cues, Verbal cues, Handout Education comprehension: verbalized understanding, returned demonstration, verbal cues required, tactile cues required, and needs further education   HOME EXERCISE PROGRAM: Access Code: [email or web address]      ASSESSMENT: CLINICAL IMPRESSION: Patient tolerated therapy well with no adverse effects.  Therapy continues to focus on improving flexibility and progressing core stabilization and hip strengthening. He does continue to demonstrate diastasis recti with any abdominal exercises so focused on TA activation with better tolerance. Practiced hip hinge technique with sit to stand and patient able to perform correctly without pain. Updated his HEP to progress strengthening. Patient would benefit from continued skilled PT to progress his mobility and strength in order to reduce pain and maximize functional ability.      OBJECTIVE IMPAIRMENTS: decreased activity tolerance, decreased ROM, decreased strength, impaired flexibility, postural dysfunction, and pain.    ACTIVITY LIMITATIONS: carrying, lifting, bending, sitting, standing, bed mobility, and locomotion level   PARTICIPATION LIMITATIONS: driving and occupation   PERSONAL FACTORS: Fitness, Past/current experiences, and Time since onset of injury/illness/exacerbation are also affecting patient's functional outcome.      GOALS: Goals reviewed with patient? Yes   SHORT TERM GOALS: Target date: 08/09/2022   Patient will be I with initial HEP in order to progress with therapy. Baseline: HEP provided at eval Goal status: INITIAL   2.  PT will review FOTO with patient by 3rd visit in order to understand expected progress and outcome with therapy. Baseline:  Goal status: INITIAL   LONG TERM GOALS: Target date: 09/06/2022   Patient will be I with final HEP to maintain progress from PT. Baseline: HEP provided at eval Goal status: INITIAL   2.  Patient will report >/= 64% status on FOTO to indicate improved functional ability. Baseline: 52% functional status Goal status: INITIAL   3.  Patient will demonstrate DLLT to 45 deg in order to indicate improve core strength and lumbopelvic control to improve lifting ability and decrease pain Baseline: unable to perform DLLT Goal status: INITIAL   4.  Patient will demonstrate hip strength  grossly >/= 4/5 MMT in order to improve tolerance for standing and getting in/out of work truck Baseline: 3/5 MMT Goal status: INITIAL   5. Patient will report low back pain </= 1/10 pain in order to reduce functional limitations with work related tasks            Baseline: 2-3/10 pain            Goal status: INITIAL   6. Patient will exhibit a 25% improvement in lumbar AROM in order to improve bed mobility and reduce tension with movement in the morning            Baseline: see limitations in lumbar AROM above            Goal status: INITIAL     PLAN: PT FREQUENCY: 1x/week   PT DURATION: 8 weeks   PLANNED INTERVENTIONS: Therapeutic exercises, Therapeutic activity, Neuromuscular re-education, Balance training, Gait training, Patient/Family education, Self Care, Joint mobilization, Joint manipulation, Aquatic Therapy, Dry Needling, Electrical stimulation, Spinal manipulation, Spinal mobilization, Cryotherapy, Moist heat, Manual therapy, and Re-evaluation.   PLAN FOR NEXT SESSION: Review HEP and progress PRN, manual/dry needling for  lumbar paraspinals, continue with lumbar and hip/hamstring stretching, progress core stabilization and hip strengthening, lifting mechanics   Hilda Blades, PT, DPT, LAT, ATC 07/31/22  2:12 PM Phone: [phone] Fax: [phone]

## 2022-07-31 ENCOUNTER — Encounter: Payer: Self-pay | Admitting: Physical Therapy

## 2022-07-31 ENCOUNTER — Other Ambulatory Visit: Payer: Self-pay

## 2022-07-31 ENCOUNTER — Ambulatory Visit: Payer: 59 | Attending: Sports Medicine | Admitting: Physical Therapy

## 2022-07-31 DIAGNOSIS — M6281 Muscle weakness (generalized): Secondary | ICD-10-CM | POA: Diagnosis present

## 2022-07-31 DIAGNOSIS — M5459 Other low back pain: Secondary | ICD-10-CM | POA: Diagnosis present

## 2022-07-31 NOTE — Patient Instructions (Signed)
Access Code: [email or web address] URL: [email or web address] Date: 07/31/2022 Prepared by: Hilda Blades  Exercises - Supine Lower Trunk Rotation  - 2 x daily - 10 reps - Supine Piriformis Stretch with Foot on Ground  - 2 x daily - 3 reps - 30 seconds hold - Bridge  - 1 x daily - 3 sets - 5 reps - 1-2 seconds hold - Straight Leg Raise  - 1 x daily - 3 sets - 5 reps - Sidelying Hip Abduction  - 1 x daily - 2 sets - 10 reps - Seated Hamstring Stretch  - 2 x daily - 3 reps - 30 seconds hold - Sit to Stand Without Arm Support  - 1 x daily - 7 x weekly - 3 sets - 10 reps

## 2022-08-01 ENCOUNTER — Encounter: Payer: Self-pay | Admitting: Family Medicine

## 2022-08-01 ENCOUNTER — Ambulatory Visit: Payer: 59 | Admitting: Family Medicine

## 2022-08-01 VITALS — BP 129/75 | HR 82 | Temp 97.8°F | Ht 76.0 in | Wt 289.6 lb

## 2022-08-01 DIAGNOSIS — M545 Low back pain, unspecified: Secondary | ICD-10-CM

## 2022-08-01 DIAGNOSIS — M109 Gout, unspecified: Secondary | ICD-10-CM

## 2022-08-01 DIAGNOSIS — I10 Essential (primary) hypertension: Secondary | ICD-10-CM

## 2022-08-01 DIAGNOSIS — E559 Vitamin D deficiency, unspecified: Secondary | ICD-10-CM | POA: Diagnosis not present

## 2022-08-01 DIAGNOSIS — Z1211 Encounter for screening for malignant neoplasm of colon: Secondary | ICD-10-CM

## 2022-08-01 DIAGNOSIS — K5790 Diverticulosis of intestine, part unspecified, without perforation or abscess without bleeding: Secondary | ICD-10-CM | POA: Diagnosis not present

## 2022-08-01 DIAGNOSIS — E890 Postprocedural hypothyroidism: Secondary | ICD-10-CM

## 2022-08-01 NOTE — Assessment & Plan Note (Signed)
Follow-up with sports medicine for this.  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  Pain currently manageable.  Taking Tylenol as needed.  No longer on NSAIDs.

## 2022-08-01 NOTE — Assessment & Plan Note (Signed)
Doing well losartan 100 mg daily.

## 2022-08-01 NOTE — Assessment & Plan Note (Signed)
Recommend he start 5000 IUs daily.  We can recheck this in 3 to 6 months.

## 2022-08-01 NOTE — Patient Instructions (Signed)
It was very nice to see you today!  I will refer you for your colonoscopy.  Please make sure that you are taking 5000 international units of vitamin D daily.  Please keep your appointment with rheumatology.  Let us know if your symptoms change.  Take care, Dr Jerline Pain  PLEASE NOTE:  If you had any lab tests, please let us know if you have not heard back within a few days. You may see your results on mychart before we have a chance to review them but we will give you a call once they are reviewed by [place].   If we ordered any referrals today, please let us know if you have not heard from their office within the next week.   If you had any urgent prescriptions sent in today, please check with the pharmacy within an hour of our visit to make sure the prescription was transmitted appropriately.   Please try these tips to maintain a healthy lifestyle:  Eat at least 3 REAL meals and 1-2 snacks per day.  Aim for no more than 5 hours between eating.  If you eat breakfast, please do so within one hour of getting up.   Each meal should contain half fruits/vegetables, one quarter protein, and one quarter carbs (no bigger than a computer mouse)  Cut down on sweet beverages. This includes juice, soda, and sweet tea.   Drink at least 1 glass of water with each meal and aim for at least 8 glasses per day  Exercise at least 150 minutes every week.

## 2022-08-01 NOTE — Assessment & Plan Note (Addendum)
Still has had occasional flareups with small amount of bright red blood per rectum depending on what he eats.  Some intermittent constipation as well.  Will place referral to GI. Last colonoscopy was 7 years ago however given his intermittent rectal bleeding would be reasonable to repeat this.

## 2022-08-01 NOTE — Assessment & Plan Note (Signed)
TSH checked by sports medicine borderline at 5.9.  He feels well.  No fatigue.  He will follow-up with endocrinology soon to discuss ongoing management.

## 2022-08-01 NOTE — Assessment & Plan Note (Signed)
No longer on allopurinol or colchicine per recommendation from sports medicine.  It is not clear if he has ever had actual gout.  He will let us know if he has any flareups consistent with gout.

## 2022-08-01 NOTE — Progress Notes (Signed)
   Alexis Norris is a 62 y.o. male who presents today for an office visit.  Assessment/Plan:  Chronic Problems Addressed Today: Low back pain Follow-up with sports medicine for this.  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  Pain currently manageable.  Taking Tylenol as needed.  No longer on NSAIDs.  Essential hypertension Doing well losartan 100 mg daily.  Vitamin D deficiency Recommend he start 5000 IUs daily.  We can recheck this in 3 to 6 months.  Hypothyroidism following radioiodine therapy TSH checked by sports medicine borderline at 5.9.  He feels well.  No fatigue.  He will follow-up with endocrinology soon to discuss ongoing management.  Gout No longer on allopurinol or colchicine per recommendation from sports medicine.  It is not clear if he has ever had actual gout.  He will let us know if he has any flareups consistent with gout.  Diverticulosis Still has had occasional flareups with small amount of bright red blood per rectum depending on what he eats.  Some intermittent constipation as well.  Will place referral to GI. Last colonoscopy was 7 years ago however given his intermittent rectal bleeding would be reasonable to repeat this.      Subjective:  HPI:  See A/p for status of chronic conditions.  At our last visit he was having ongoing issues with recurrent low back. We had initially discussed referral to sports medicine or PT however he declined.  Symptoms progressed and he ended up seeing sports medicine a couple of months after our visit.  Ended up getting further workup including imaging and labs.  This was notable for positive HLA-B27.  Also found to have low B12 and borderline TSH.  He was started on meloxicam and physical therapy.  Also started on home exercise program.  He has pain has improved significantly.  He is known low on meloxicam.  Now on Tylenol 2 times daily which works well.  He was referred to rheumatologist and has  upcoming appointment with them in a couple of months.  He has not yet started any vitamin D supplementation.  This sports medicine physician also told him that his history was not consistent with gout and recommended that he stop the allopurinol and colchicine.  He has done this and seems to be doing well without any obvious recurrence or flares.  He has also had ongoing issues with intermittent rectal bleeding depending on what he eats.  Predominantly happens after eating peanuts.  Does have a known history of diverticulosis and has attributed to this.  He has had more constipation issues as well recently.  He would like to have a colonoscopy.       Objective:  Physical Exam: BP 129/75   Pulse 82   Temp 97.8 F (36.6 C) (Temporal)   Ht 6\' 4"  (1.93 m)   Wt 289 lb 9.6 oz (131.4 kg)   SpO2 98%   BMI 35.25 kg/m   Gen: No acute distress, resting comfortably CV: Regular rate and rhythm with no murmurs appreciated Pulm: Normal work of breathing, clear to auscultation bilaterally with no crackles, wheezes, or rhonchi Neuro: Grossly normal, moves all extremities Psych: Normal affect and thought content      Becky Berberian M. Jerline Pain, MD 08/01/2022 12:08 PM

## 2022-08-07 ENCOUNTER — Ambulatory Visit: Payer: 59

## 2022-08-07 DIAGNOSIS — M5459 Other low back pain: Secondary | ICD-10-CM

## 2022-08-07 DIAGNOSIS — M6281 Muscle weakness (generalized): Secondary | ICD-10-CM

## 2022-08-07 NOTE — Therapy (Signed)
OUTPATIENT PHYSICAL THERAPY TREATMENT NOTE   Patient Name: Alexis Norris MRN: [id number] DOB:Dec 29, 1960, 62 y.o., male Today's Date: 08/07/2022   PCP: Ardith Dark, MD  REFERRING PROVIDER: Richardean Sale, DO    END OF SESSION:   PT End of Session - 08/07/22 1302     Visit Number 4    Number of Visits 9    Date for PT Re-Evaluation 09/06/22    Authorization Type [employer]    PT Start Time 1301    PT Stop Time 1341    PT Time Calculation (min) 40 min    Activity Tolerance Patient tolerated treatment well    Behavior During Therapy [facility] for tasks assessed/performed               Past Medical History:  Diagnosis Date   DYSLIPIDEMIA 01/13/2007   no medicines    HYPERTENSION 01/13/2007   HYPOTHYROIDISM, 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   Past Surgical History:  Procedure Laterality Date   I-131 therapy  09/1997   maybe 2000-2001   INGUINAL HERNIA REPAIR Right 09/28/2013   Procedure: LAPAROSCOPIC RIGHT  INGUINAL HERNIA;  Surgeon: Axel Filler, MD;  Location: [facility] OR;  Service: General;  Laterality: Right;   INSERTION OF MESH Right 09/28/2013   Procedure: INSERTION OF MESH;  Surgeon: Axel Filler, MD;  Location: [facility] OR;  Service: General;  Laterality: Right;   right knuckle     pin placed in high school   Patient Active Problem List   Diagnosis Date Noted   Low back pain 08/01/2022   Vitamin D deficiency 08/01/2022   Diverticulosis 04/17/2022   GERD (gastroesophageal reflux disease) 04/17/2022   Erectile dysfunction 04/11/2021   Diffuse cutaneous mucinosis 04/10/2020   Gout 02/06/2016   Allergic rhinitis, cause unspecified 08/26/2011   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   Dyslipidemia 01/13/2007   Essential hypertension 01/13/2007    REFERRING DIAG: Low back pain, unspecified back pain laterality, unspecified chronicity, unspecified whether sciatica present   THERAPY DIAG:  Other low back pain  Muscle weakness (generalized)  Rationale  for Evaluation and Treatment Rehabilitation  PERTINENT HISTORY: See PMH above   PRECAUTIONS: None   SUBJECTIVE:                                                                                                                                                                                     SUBJECTIVE STATEMENT: Patient reports slightly increased pain today, he can tell if he does a sudden movement then he will get a pull in his back. He is not working today so has not had to push it. He did take a Tylenol earlier today.  PAIN:  Are you  having pain? Yes:  NPRS scale: 3-4/10 Pain location: Low back Pain description: Ache, spasm Aggravating factors: Sitting, driving, moving in the morning Relieving factors: Tylenol   OBJECTIVE: (objective measures completed at initial evaluation unless otherwise dated) DIAGNOSTIC FINDINGS:  X-ray lumbar 3/6/2024L FINDINGS: There is no evidence of lumbar spine fracture. Alignment is normal. Intervertebral disc spaces are maintained. Large amount of osteophyte formation is noted extending from L2-3 to L5-S1. IMPRESSION: Multilevel degenerative changes as described above. No acute abnormality seen.   PATIENT SURVEYS:  FOTO 52% functional status   MUSCLE LENGTH: Flexibility deficit of bilateral hamstrings and hips   POSTURE:             Loss of lumbar lordosis   PALPATION: Mildly tender to lumbar paraspinals, CPA hypomobility and discomfort   LUMBAR ROM:    AROM eval  Flexion 50% - hamstring tightness  Extension < 25%  Right lateral flexion 25%  Left lateral flexion 50%  Right rotation 75%  Left rotation 50%   (Blank rows = not tested)   LOWER EXTREMITY MMT:      MMT  Right eval Left eval Rt / Lt 07/31/2022  Hip flexion 4 4   Hip extension 3 3   Hip abduction 3 3 3+ / 3+  Hip adduction       Hip internal rotation       Hip external rotation       Knee flexion 5 5   Knee extension 5 5   Ankle dorsiflexion       Ankle  plantarflexion       Ankle inversion       Ankle eversion        (Blank rows = not tested)   LUMBAR SPECIAL TESTS:  Lumbar radicular testing negative   FUNCTIONAL TESTS:  Unable to hold DLLT due to core weakness   GAIT: Assistive device utilized: None Level of assistance: Complete Independence Comments: WFL     TODAY'S TREATMENT:     [facility] Adult PT Treatment:                                                DATE: 08/07/22 Therapeutic Exercise: NuStep L7 x 5 min with UE/LE while taking subjective Seated hamstring stretch 2 x 30 sec each Supine piriformis stretch 2 x 30 sec LTR x 10 Bridge 2 x 10 Hooklying abdominal activation with march x 10 Hooklying abdominal activation with alternating leg extension x 10 90-90 alternating bent knee lift with hold x 10 Sit to stand hold 10# with focus on hip hinge 2 x 10   [facility] Adult PT Treatment:                                                DATE: 07/31/22 Therapeutic Exercise: NuStep L7 x 5 min with UE/LE while taking subjective Seated hamstring stretch 2 x 20 sec each Supine piriformis stretch 2 x 20 sec LTR x 10 Modified thomas stretch 2 x 30 sec each Bridge 2 x 10 Hooklying abdominal activation with march x 10 Hooklying abdominal activation with alternating leg extension x 10 90-90 alternating bent knee lift with hold x 10 Sit to stand hold 10# with focus on hip  hinge 2 x 10   [facility] Adult PT Treatment:                                                DATE: 07/24/22 Therapeutic Exercise: Nustep level 5 x 5 mins Palloff press 10# 2x10 BIL Sidestepping at counter YTB at ankles x4 laps  Standing hip abduction/extension YTB at ankles 2x10 BIL Supine 90/90 isometric hold 3x10" Supine figure 4 piriformis stretch 2x30" BIL Prone on elbows x2' Prone press ups 2x10  LTR x10 Sidelying openbooks x10 BIL    PATIENT EDUCATION:  Education details: HEP update Person educated: Patient Education method: Explanation, Demonstration, Tactile  cues, Verbal cues, Handout Education comprehension: verbalized understanding, returned demonstration, verbal cues required, tactile cues required, and needs further education   HOME EXERCISE PROGRAM: Access Code: [email or web address]      ASSESSMENT: CLINICAL IMPRESSION: Patient presents to PT with reports of increased pain today, though minimal. He states he did have some soreness after last session, but that it did not linger. He did not want to repeat the modified thomas stretch this session due to fear of falling. Session today continued to focus on core and proximal hip strengthening as well as lumbar mobility. Patient was able to tolerate all prescribed exercises with no adverse effects. Patient continues to benefit from skilled PT services and should be progressed as able to improve functional independence.      OBJECTIVE IMPAIRMENTS: decreased activity tolerance, decreased ROM, decreased strength, impaired flexibility, postural dysfunction, and pain.    ACTIVITY LIMITATIONS: carrying, lifting, bending, sitting, standing, bed mobility, and locomotion level   PARTICIPATION LIMITATIONS: driving and occupation   PERSONAL FACTORS: Fitness, Past/current experiences, and Time since onset of injury/illness/exacerbation are also affecting patient's functional outcome.      GOALS: Goals reviewed with patient? Yes   SHORT TERM GOALS: Target date: 08/09/2022   Patient will be I with initial HEP in order to progress with therapy. Baseline: HEP provided at eval Goal status: Met Pt reports adherence 08/07/22   2.  PT will review FOTO with patient by 3rd visit in order to understand expected progress and outcome with therapy. Baseline:  Goal status: INITIAL   LONG TERM GOALS: Target date: 09/06/2022   Patient will be I with final HEP to maintain progress from PT. Baseline: HEP provided at eval Goal status: INITIAL   2.  Patient will report >/= 64% status on FOTO to indicate improved functional  ability. Baseline: 52% functional status Goal status: INITIAL   3.  Patient will demonstrate DLLT to 45 deg in order to indicate improve core strength and lumbopelvic control to improve lifting ability and decrease pain Baseline: unable to perform DLLT Goal status: INITIAL   4.  Patient will demonstrate hip strength grossly >/= 4/5 MMT in order to improve tolerance for standing and getting in/out of work truck Baseline: 3/5 MMT Goal status: INITIAL   5. Patient will report low back pain </= 1/10 pain in order to reduce functional limitations with work related tasks            Baseline: 2-3/10 pain            Goal status: INITIAL   6. Patient will exhibit a 25% improvement in lumbar AROM in order to improve bed mobility and reduce tension with movement in the morning  Baseline: see limitations in lumbar AROM above            Goal status: INITIAL     PLAN: PT FREQUENCY: 1x/week   PT DURATION: 8 weeks   PLANNED INTERVENTIONS: Therapeutic exercises, Therapeutic activity, Neuromuscular re-education, Balance training, Gait training, Patient/Family education, Self Care, Joint mobilization, Joint manipulation, Aquatic Therapy, Dry Needling, Electrical stimulation, Spinal manipulation, Spinal mobilization, Cryotherapy, Moist heat, Manual therapy, and Re-evaluation.   PLAN FOR NEXT SESSION: Review HEP and progress PRN, manual/dry needling for lumbar paraspinals, continue with lumbar and hip/hamstring stretching, progress core stabilization and hip strengthening, lifting mechanics   Berta Minor PTA 08/07/22  1:47 PM

## 2022-08-13 NOTE — Therapy (Signed)
OUTPATIENT PHYSICAL THERAPY TREATMENT NOTE   Patient Name: Alexis Norris MRN: [id number] DOB:1960-10-31, 62 y.o., male Today's Date: 08/14/2022   PCP: Ardith Dark, MD  REFERRING PROVIDER: Richardean Sale, DO    END OF SESSION:   PT End of Session - 08/14/22 1311     Visit Number 5    Number of Visits 9    Date for PT Re-Evaluation 09/06/22    Authorization Type [employer]    PT Start Time 1315    PT Stop Time 1355    PT Time Calculation (min) 40 min    Activity Tolerance Patient tolerated treatment well    Behavior During Therapy [facility] for tasks assessed/performed                Past Medical History:  Diagnosis Date   DYSLIPIDEMIA 01/13/2007   no medicines    HYPERTENSION 01/13/2007   HYPOTHYROIDISM, 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   Past Surgical History:  Procedure Laterality Date   I-131 therapy  09/1997   maybe 2000-2001   INGUINAL HERNIA REPAIR Right 09/28/2013   Procedure: LAPAROSCOPIC RIGHT  INGUINAL HERNIA;  Surgeon: Axel Filler, MD;  Location: [facility] OR;  Service: General;  Laterality: Right;   INSERTION OF MESH Right 09/28/2013   Procedure: INSERTION OF MESH;  Surgeon: Axel Filler, MD;  Location: [facility] OR;  Service: General;  Laterality: Right;   right knuckle     pin placed in high school   Patient Active Problem List   Diagnosis Date Noted   Low back pain 08/01/2022   Vitamin D deficiency 08/01/2022   Diverticulosis 04/17/2022   GERD (gastroesophageal reflux disease) 04/17/2022   Erectile dysfunction 04/11/2021   Diffuse cutaneous mucinosis 04/10/2020   Gout 02/06/2016   Allergic rhinitis, cause unspecified 08/26/2011   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   Dyslipidemia 01/13/2007   Essential hypertension 01/13/2007    REFERRING DIAG: Low back pain, unspecified back pain laterality, unspecified chronicity, unspecified whether sciatica present   THERAPY DIAG:  Other low back pain  Muscle weakness (generalized)  Rationale  for Evaluation and Treatment Rehabilitation  PERTINENT HISTORY: See PMH above   PRECAUTIONS: None   SUBJECTIVE:                                                                                                                                                                                     SUBJECTIVE STATEMENT: Patient reports he has good days and bad days, yesterday he had some pain while at work but today he has no pain at all.  PAIN:  Are you having pain? Yes:  NPRS scale: 0/10 Pain location: Low back Pain description: Ache, spasm Aggravating factors:  Sitting, driving, moving in the morning Relieving factors: Tylenol   OBJECTIVE: (objective measures completed at initial evaluation unless otherwise dated) DIAGNOSTIC FINDINGS:  X-ray lumbar 3/6/2024L FINDINGS: There is no evidence of lumbar spine fracture. Alignment is normal. Intervertebral disc spaces are maintained. Large amount of osteophyte formation is noted extending from L2-3 to L5-S1. IMPRESSION: Multilevel degenerative changes as described above. No acute abnormality seen.   PATIENT SURVEYS:  FOTO 52% functional status   MUSCLE LENGTH: Flexibility deficit of bilateral hamstrings and hips   POSTURE:             Loss of lumbar lordosis   PALPATION: Mildly tender to lumbar paraspinals, CPA hypomobility and discomfort   LUMBAR ROM:    AROM eval  Flexion 50% - hamstring tightness  Extension < 25%  Right lateral flexion 25%  Left lateral flexion 50%  Right rotation 75%  Left rotation 50%   (Blank rows = not tested)   LOWER EXTREMITY MMT:      MMT  Right eval Left eval Rt / Lt 07/31/2022  Hip flexion 4 4   Hip extension 3 3   Hip abduction 3 3 3+ / 3+  Hip adduction       Hip internal rotation       Hip external rotation       Knee flexion 5 5   Knee extension 5 5   Ankle dorsiflexion       Ankle plantarflexion       Ankle inversion       Ankle eversion        (Blank rows = not tested)    LUMBAR SPECIAL TESTS:  Lumbar radicular testing negative   FUNCTIONAL TESTS:  Unable to hold DLLT due to core weakness   GAIT: Assistive device utilized: None Level of assistance: Complete Independence Comments: WFL     TODAY'S TREATMENT:     [facility] Adult PT Treatment:                                                DATE: 08/14/22 Therapeutic Exercise: NuStep L7 x 5 min with UE/LE while taking subjective Seated hamstring stretch 2 x 20 sec each LTR x 10 Supine piriformis stretch 2 x 20 sec Bridge 2 x 10 90-90 alternating foot tap 2 x 10 Side clamshell with red 2 x 15 each Sidelying thoracolumbar rotation x 10 each Pallof press FM 13# 2 x 10 each Deadlift with 30# from 4" box 2 x 10   [facility] Adult PT Treatment:                                                DATE: 08/07/22 Therapeutic Exercise: NuStep L7 x 5 min with UE/LE while taking subjective Seated hamstring stretch 2 x 30 sec each Supine piriformis stretch 2 x 30 sec LTR x 10 Bridge 2 x 10 Hooklying abdominal activation with march x 10 Hooklying abdominal activation with alternating leg extension x 10 90-90 alternating bent knee lift with hold x 10 Sit to stand hold 10# with focus on hip hinge 2 x 10  [facility] Adult PT Treatment:  DATE: 07/31/22 Therapeutic Exercise: NuStep L7 x 5 min with UE/LE while taking subjective Seated hamstring stretch 2 x 20 sec each Supine piriformis stretch 2 x 20 sec LTR x 10 Modified thomas stretch 2 x 30 sec each Bridge 2 x 10 Hooklying abdominal activation with march x 10 Hooklying abdominal activation with alternating leg extension x 10 90-90 alternating bent knee lift with hold x 10 Sit to stand hold 10# with focus on hip hinge 2 x 10   PATIENT EDUCATION:  Education details: HEP update Person educated: Patient Education method: Explanation, Demonstration, Tactile cues, Verbal cues, Handout Education comprehension: verbalized understanding,  returned demonstration, verbal cues required, tactile cues required, and needs further education   HOME EXERCISE PROGRAM: Access Code: [email or web address]      ASSESSMENT: CLINICAL IMPRESSION: Patient tolerated therapy well with no adverse effects. Therapy continues to focus primarily on improve lumbar mobility and progressing core and hip strengthening. He does continue to exhibit gross flexibility deficits and limitations in lumbar motion. He was able to progress with lifting and more challenging core exercises but does exhibit diastasis recti with supine core exercises that patient reports reports causes some low back pulling. He reports greater tightness on the right hamstring and hip region this visit. Updated HEP with good tolerance. Patient would benefit from continued skilled PT to progress his mobility and strength in order to reduce pain and maximize functional ability.      OBJECTIVE IMPAIRMENTS: decreased activity tolerance, decreased ROM, decreased strength, impaired flexibility, postural dysfunction, and pain.    ACTIVITY LIMITATIONS: carrying, lifting, bending, sitting, standing, bed mobility, and locomotion level   PARTICIPATION LIMITATIONS: driving and occupation   PERSONAL FACTORS: Fitness, Past/current experiences, and Time since onset of injury/illness/exacerbation are also affecting patient's functional outcome.      GOALS: Goals reviewed with patient? Yes   SHORT TERM GOALS: Target date: 08/09/2022   Patient will be I with initial HEP in order to progress with therapy. Baseline: HEP provided at eval Goal status: MET Pt reports adherence 08/07/22   2.  PT will review FOTO with patient by 3rd visit in order to understand expected progress and outcome with therapy. Baseline:  08/14/2022: reviewed Goal status: MET   LONG TERM GOALS: Target date: 09/06/2022   Patient will be I with final HEP to maintain progress from PT. Baseline: HEP provided at eval Goal status: INITIAL    2.  Patient will report >/= 64% status on FOTO to indicate improved functional ability. Baseline: 52% functional status Goal status: INITIAL   3.  Patient will demonstrate DLLT to 45 deg in order to indicate improve core strength and lumbopelvic control to improve lifting ability and decrease pain Baseline: unable to perform DLLT Goal status: INITIAL   4.  Patient will demonstrate hip strength grossly >/= 4/5 MMT in order to improve tolerance for standing and getting in/out of work truck Baseline: 3/5 MMT Goal status: INITIAL   5. Patient will report low back pain </= 1/10 pain in order to reduce functional limitations with work related tasks            Baseline: 2-3/10 pain            Goal status: INITIAL   6. Patient will exhibit a 25% improvement in lumbar AROM in order to improve bed mobility and reduce tension with movement in the morning            Baseline: see limitations in lumbar AROM above  Goal status: INITIAL     PLAN: PT FREQUENCY: 1x/week   PT DURATION: 8 weeks   PLANNED INTERVENTIONS: Therapeutic exercises, Therapeutic activity, Neuromuscular re-education, Balance training, Gait training, Patient/Family education, Self Care, Joint mobilization, Joint manipulation, Aquatic Therapy, Dry Needling, Electrical stimulation, Spinal manipulation, Spinal mobilization, Cryotherapy, Moist heat, Manual therapy, and Re-evaluation.   PLAN FOR NEXT SESSION: Review HEP and progress PRN, manual/dry needling for lumbar paraspinals, continue with lumbar and hip/hamstring stretching, progress core stabilization and hip strengthening, lifting mechanics   Rosana Hoes, PT, DPT, LAT, ATC 08/14/22  2:28 PM Phone: [phone] Fax: [phone]

## 2022-08-14 ENCOUNTER — Other Ambulatory Visit: Payer: Self-pay

## 2022-08-14 ENCOUNTER — Encounter: Payer: Self-pay | Admitting: Physical Therapy

## 2022-08-14 ENCOUNTER — Ambulatory Visit: Payer: 59 | Admitting: Physical Therapy

## 2022-08-14 DIAGNOSIS — M5459 Other low back pain: Secondary | ICD-10-CM

## 2022-08-14 DIAGNOSIS — M6281 Muscle weakness (generalized): Secondary | ICD-10-CM

## 2022-08-14 NOTE — Patient Instructions (Signed)
Access Code: [email or web address] URL: [email or web address] Date: 08/14/2022 Prepared by: Rosana Hoes  Exercises - Supine Lower Trunk Rotation  - 1 x daily - 10 reps - Supine Piriformis Stretch with Foot on Ground  - 1 x daily - 3 reps - 30 seconds hold - Bridge  - 1 x daily - 2 sets - 10 reps - 1-2 seconds hold - Straight Leg Raise  - 1 x daily - 2 sets - 10 reps - Clam with Resistance  - 1 x daily - 2 sets - 15 reps - Sidelying Thoracic Lumbar Rotation  - 1 x daily - 10 reps - Seated Hamstring Stretch  - 2 x daily - 3 reps - 30 seconds hold - Sit to Stand Without Arm Support  - 1 x daily - 7 x weekly - 3 sets - 10 reps

## 2022-08-20 NOTE — Therapy (Signed)
OUTPATIENT PHYSICAL THERAPY TREATMENT NOTE   Patient Name: Alexis Norris MRN: [id number] DOB:1960/07/08, 62 y.o., male Today's Date: 08/21/2022   PCP: Ardith Dark, MD  REFERRING PROVIDER: Richardean Sale, DO    END OF SESSION:   PT End of Session - 08/21/22 1621     Visit Number 6    Number of Visits 9    Date for PT Re-Evaluation 09/06/22    Authorization Type [employer]    PT Start Time 1615    PT Stop Time 1655    PT Time Calculation (min) 40 min    Activity Tolerance Patient tolerated treatment well    Behavior During Therapy [facility] for tasks assessed/performed                 Past Medical History:  Diagnosis Date   DYSLIPIDEMIA 01/13/2007   no medicines    HYPERTENSION 01/13/2007   HYPOTHYROIDISM, 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   Past Surgical History:  Procedure Laterality Date   I-131 therapy  09/1997   maybe 2000-2001   INGUINAL HERNIA REPAIR Right 09/28/2013   Procedure: LAPAROSCOPIC RIGHT  INGUINAL HERNIA;  Surgeon: Axel Filler, MD;  Location: [facility] OR;  Service: General;  Laterality: Right;   INSERTION OF MESH Right 09/28/2013   Procedure: INSERTION OF MESH;  Surgeon: Axel Filler, MD;  Location: [facility] OR;  Service: General;  Laterality: Right;   right knuckle     pin placed in high school   Patient Active Problem List   Diagnosis Date Noted   Low back pain 08/01/2022   Vitamin D deficiency 08/01/2022   Diverticulosis 04/17/2022   GERD (gastroesophageal reflux disease) 04/17/2022   Erectile dysfunction 04/11/2021   Diffuse cutaneous mucinosis 04/10/2020   Gout 02/06/2016   Allergic rhinitis, cause unspecified 08/26/2011   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   Dyslipidemia 01/13/2007   Essential hypertension 01/13/2007    REFERRING DIAG: Low back pain, unspecified back pain laterality, unspecified chronicity, unspecified whether sciatica present   THERAPY DIAG:  Other low back pain  Muscle weakness  (generalized)  Rationale for Evaluation and Treatment Rehabilitation  PERTINENT HISTORY: See PMH above   PRECAUTIONS: None   SUBJECTIVE:                                                                                                                                                                                     SUBJECTIVE STATEMENT: Patient reports it has been a good week, his back has been feeling pretty good.  PAIN:  Are you having pain? Yes:  NPRS scale: 0/10 Pain location: Low back Pain description: Ache, spasm Aggravating factors: Sitting, driving, moving in the morning Relieving factors: Tylenol  OBJECTIVE: (objective measures completed at initial evaluation unless otherwise dated) DIAGNOSTIC FINDINGS:  X-ray lumbar 3/6/2024L FINDINGS: There is no evidence of lumbar spine fracture. Alignment is normal. Intervertebral disc spaces are maintained. Large amount of osteophyte formation is noted extending from L2-3 to L5-S1. IMPRESSION: Multilevel degenerative changes as described above. No acute abnormality seen.   PATIENT SURVEYS:  FOTO 52% functional status  08/21/2022: 54%   MUSCLE LENGTH: Flexibility deficit of bilateral hamstrings and hips   POSTURE:             Loss of lumbar lordosis   PALPATION: Mildly tender to lumbar paraspinals, CPA hypomobility and discomfort   LUMBAR ROM:    AROM eval  Flexion 50% - hamstring tightness  Extension < 25%  Right lateral flexion 25%  Left lateral flexion 50%  Right rotation 75%  Left rotation 50%   (Blank rows = not tested)   LOWER EXTREMITY MMT:      MMT  Right eval Left eval Rt / Lt 07/31/2022  Hip flexion 4 4   Hip extension 3 3   Hip abduction 3 3 3+ / 3+  Hip adduction       Hip internal rotation       Hip external rotation       Knee flexion 5 5   Knee extension 5 5   Ankle dorsiflexion       Ankle plantarflexion       Ankle inversion       Ankle eversion        (Blank rows = not tested)    LUMBAR SPECIAL TESTS:  Lumbar radicular testing negative   FUNCTIONAL TESTS:  Unable to hold DLLT due to core weakness   GAIT: Assistive device utilized: None Level of assistance: Complete Independence Comments: WFL     TODAY'S TREATMENT:     [facility] Adult PT Treatment:                                                DATE: 08/21/22 Therapeutic Exercise: NuStep L7 x 5 min with UE/LE while taking subjective Seated hamstring stretch 2 x 30 sec each Supine piriformis stretch push / pull 2 x 15 sec LTR x 10 Bridge 2 x 10 Side clamshell with green 2 x 15 each Sidelying thoracolumbar rotation x 10 each Cat cow x 5 Quadruped alternating hip extension with forearms on bolster x 10 Pallof press FM 13# 2 x 10 each Deadlift with 45# from 6" box 2 x 10   [facility] Adult PT Treatment:                                                DATE: 08/14/22 Therapeutic Exercise: NuStep L7 x 5 min with UE/LE while taking subjective Seated hamstring stretch 2 x 20 sec each LTR x 10 Supine piriformis stretch 2 x 20 sec Bridge 2 x 10 90-90 alternating foot tap 2 x 10 Side clamshell with red 2 x 15 each Sidelying thoracolumbar rotation x 10 each Pallof press FM 13# 2 x 10 each Deadlift with 30# from 4" box 2 x 10  [facility] Adult PT Treatment:  DATE: 08/07/22 Therapeutic Exercise: NuStep L7 x 5 min with UE/LE while taking subjective Seated hamstring stretch 2 x 30 sec each Supine piriformis stretch 2 x 30 sec LTR x 10 Bridge 2 x 10 Hooklying abdominal activation with march x 10 Hooklying abdominal activation with alternating leg extension x 10 90-90 alternating bent knee lift with hold x 10 Sit to stand hold 10# with focus on hip hinge 2 x 10  [facility] Adult PT Treatment:                                                DATE: 07/31/22 Therapeutic Exercise: NuStep L7 x 5 min with UE/LE while taking subjective Seated hamstring stretch 2 x 20 sec each Supine piriformis  stretch 2 x 20 sec LTR x 10 Modified thomas stretch 2 x 30 sec each Bridge 2 x 10 Hooklying abdominal activation with march x 10 Hooklying abdominal activation with alternating leg extension x 10 90-90 alternating bent knee lift with hold x 10 Sit to stand hold 10# with focus on hip hinge 2 x 10   PATIENT EDUCATION:  Education details: HEP Person educated: Patient Education method: Programmer, multimedia, Demonstration, Tactile cues, Verbal cues Education comprehension: verbalized understanding, returned demonstration, verbal cues required, tactile cues required, and needs further education   HOME EXERCISE PROGRAM: Access Code: [email or web address]      ASSESSMENT: CLINICAL IMPRESSION: Patient tolerated therapy well with no adverse effects. Therapy continues to focus on progressing spinal mobility and core stabilization and hip strength with good tolerance. He does report an improvement in his functional ability on FOTO this visit. He is progressing his lifting and tolerating increased weight with good lifting technique. Trialed quadruped exercises this visit and he does exhibit difficulty performing cat cow due to limitations with spinal extension. No changes to HEP this visit. Patient would benefit from continued skilled PT to progress his mobility and strength in order to reduce pain and maximize functional ability.      OBJECTIVE IMPAIRMENTS: decreased activity tolerance, decreased ROM, decreased strength, impaired flexibility, postural dysfunction, and pain.    ACTIVITY LIMITATIONS: carrying, lifting, bending, sitting, standing, bed mobility, and locomotion level   PARTICIPATION LIMITATIONS: driving and occupation   PERSONAL FACTORS: Fitness, Past/current experiences, and Time since onset of injury/illness/exacerbation are also affecting patient's functional outcome.      GOALS: Goals reviewed with patient? Yes   SHORT TERM GOALS: Target date: 08/09/2022   Patient will be I with initial HEP in  order to progress with therapy. Baseline: HEP provided at eval Goal status: MET Pt reports adherence 08/07/22   2.  PT will review FOTO with patient by 3rd visit in order to understand expected progress and outcome with therapy. Baseline:  08/14/2022: reviewed Goal status: MET   LONG TERM GOALS: Target date: 09/06/2022   Patient will be I with final HEP to maintain progress from PT. Baseline: HEP provided at eval Goal status: INITIAL   2.  Patient will report >/= 64% status on FOTO to indicate improved functional ability. Baseline: 52% functional status 08/21/2022: 54% Goal status: INITIAL   3.  Patient will demonstrate DLLT to 45 deg in order to indicate improve core strength and lumbopelvic control to improve lifting ability and decrease pain Baseline: unable to perform DLLT Goal status: INITIAL   4.  Patient will demonstrate hip strength grossly >/= 4/5  MMT in order to improve tolerance for standing and getting in/out of work truck Baseline: 3/5 MMT Goal status: INITIAL   5. Patient will report low back pain </= 1/10 pain in order to reduce functional limitations with work related tasks            Baseline: 2-3/10 pain            Goal status: INITIAL   6. Patient will exhibit a 25% improvement in lumbar AROM in order to improve bed mobility and reduce tension with movement in the morning            Baseline: see limitations in lumbar AROM above            Goal status: INITIAL     PLAN: PT FREQUENCY: 1x/week   PT DURATION: 8 weeks   PLANNED INTERVENTIONS: Therapeutic exercises, Therapeutic activity, Neuromuscular re-education, Balance training, Gait training, Patient/Family education, Self Care, Joint mobilization, Joint manipulation, Aquatic Therapy, Dry Needling, Electrical stimulation, Spinal manipulation, Spinal mobilization, Cryotherapy, Moist heat, Manual therapy, and Re-evaluation.   PLAN FOR NEXT SESSION: Review HEP and progress PRN, manual/dry needling for lumbar  paraspinals, continue with lumbar and hip/hamstring stretching, progress core stabilization and hip strengthening, lifting mechanics   Rosana Hoes, PT, DPT, LAT, ATC 08/21/22  5:01 PM Phone: [phone] Fax: [phone]

## 2022-08-21 ENCOUNTER — Other Ambulatory Visit: Payer: Self-pay

## 2022-08-21 ENCOUNTER — Encounter: Payer: Self-pay | Admitting: Physical Therapy

## 2022-08-21 ENCOUNTER — Ambulatory Visit: Payer: 59 | Admitting: Physical Therapy

## 2022-08-21 DIAGNOSIS — M5459 Other low back pain: Secondary | ICD-10-CM

## 2022-08-21 DIAGNOSIS — M6281 Muscle weakness (generalized): Secondary | ICD-10-CM

## 2022-08-27 NOTE — Therapy (Signed)
OUTPATIENT PHYSICAL THERAPY TREATMENT NOTE   Patient Name: Alexis Norris MRN: [id number] DOB:07/11/1960, 62 y.o., male Today's Date: 08/28/2022   PCP: Ardith Dark, MD  REFERRING PROVIDER: Richardean Sale, DO    END OF SESSION:   PT End of Session - 08/28/22 1510     Visit Number 7    Number of Visits 12    Date for PT Re-Evaluation 10/09/22    Authorization Type [employer]    PT Start Time 1445    PT Stop Time 1530    PT Time Calculation (min) 45 min    Activity Tolerance Patient tolerated treatment well    Behavior During Therapy [facility] for tasks assessed/performed                  Past Medical History:  Diagnosis Date   DYSLIPIDEMIA 01/13/2007   no medicines    HYPERTENSION 01/13/2007   HYPOTHYROIDISM, 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   Past Surgical History:  Procedure Laterality Date   I-131 therapy  09/1997   maybe 2000-2001   INGUINAL HERNIA REPAIR Right 09/28/2013   Procedure: LAPAROSCOPIC RIGHT  INGUINAL HERNIA;  Surgeon: Axel Filler, MD;  Location: [facility] OR;  Service: General;  Laterality: Right;   INSERTION OF MESH Right 09/28/2013   Procedure: INSERTION OF MESH;  Surgeon: Axel Filler, MD;  Location: [facility] OR;  Service: General;  Laterality: Right;   right knuckle     pin placed in high school   Patient Active Problem List   Diagnosis Date Noted   Low back pain 08/01/2022   Vitamin D deficiency 08/01/2022   Diverticulosis 04/17/2022   GERD (gastroesophageal reflux disease) 04/17/2022   Erectile dysfunction 04/11/2021   Diffuse cutaneous mucinosis 04/10/2020   Gout 02/06/2016   Allergic rhinitis, cause unspecified 08/26/2011   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   Dyslipidemia 01/13/2007   Essential hypertension 01/13/2007    REFERRING DIAG: Low back pain, unspecified back pain laterality, unspecified chronicity, unspecified whether sciatica present   THERAPY DIAG:  Other low back pain  Muscle weakness  (generalized)  Rationale for Evaluation and Treatment Rehabilitation  PERTINENT HISTORY: See PMH above   PRECAUTIONS: None   SUBJECTIVE:                                                                                                                                                                                     SUBJECTIVE STATEMENT: Patient reports his back has been doing well the past week. He does notice if he falls asleep in the recliner his back with be back and sore but once he gets up and moves it will loosen back up. He is having some pain  in the left heel.   PAIN:  Are you having pain? Yes:  NPRS scale: 0/10 Pain location: Low back Pain description: Ache, spasm Aggravating factors: Sitting, driving, moving in the morning Relieving factors: Tylenol   OBJECTIVE: (objective measures completed at initial evaluation unless otherwise dated) DIAGNOSTIC FINDINGS:  X-ray lumbar 3/6/2024L FINDINGS: There is no evidence of lumbar spine fracture. Alignment is normal. Intervertebral disc spaces are maintained. Large amount of osteophyte formation is noted extending from L2-3 to L5-S1. IMPRESSION: Multilevel degenerative changes as described above. No acute abnormality seen.   PATIENT SURVEYS:  FOTO 52% functional status  08/21/2022: 54%   MUSCLE LENGTH: Flexibility deficit of bilateral hamstrings and hips   POSTURE:             Loss of lumbar lordosis   PALPATION: Mildly tender to lumbar paraspinals, CPA hypomobility and discomfort   LUMBAR ROM:    AROM eval 08/28/2022  Flexion 50% - hamstring tightness 75%  Extension < 25% 25%  Right lateral flexion 25% 50%  Left lateral flexion 50% 50%  Right rotation 75% 75%  Left rotation 50% 75%   (Blank rows = not tested)   LOWER EXTREMITY MMT:      MMT  Right eval Left eval Rt / Lt 07/31/2022 Rt / Lt 08/28/2022  Hip flexion 4 4    Hip extension 3 3  4- / 4-  Hip abduction 3 3 3+ / 3+ 4- / 4-  Hip adduction        Hip  internal rotation        Hip external rotation        Knee flexion 5 5    Knee extension 5 5    Ankle dorsiflexion        Ankle plantarflexion        Ankle inversion        Ankle eversion         (Blank rows = not tested)   LUMBAR SPECIAL TESTS:  Lumbar radicular testing negative   FUNCTIONAL TESTS:  Unable to hold DLLT due to core weakness   GAIT: Assistive device utilized: None Level of assistance: Complete Independence Comments: WFL     TODAY'S TREATMENT:     [facility] Adult PT Treatment:                                                DATE: 08/28/22 Therapeutic Exercise: NuStep L7 x 5 min with UE/LE while taking subjective Seated hamstring stretch 3 x 20 sec each Supine piriformis stretch push / pull 2 x 15 sec each LTR x 10 Bridge 2 x 10 SLR with abdominal engagement 2 x 10 Side clamshell with green 2 x 15 each Sidelying thoracolumbar rotation x 10 each Pallof press FM 13# 2 x 10 each Standing calf stretch 2 x 20 sec each   [facility] Adult PT Treatment:                                                DATE: 08/21/22 Therapeutic Exercise: NuStep L7 x 5 min with UE/LE while taking subjective Seated hamstring stretch 2 x 30 sec each Supine piriformis stretch push / pull 2 x 15 sec LTR x 10  Bridge 2 x 10 Side clamshell with green 2 x 15 each Sidelying thoracolumbar rotation x 10 each Cat cow x 5 Quadruped alternating hip extension with forearms on bolster x 10 Pallof press FM 13# 2 x 10 each Deadlift with 45# from 6" box 2 x 10  [facility] Adult PT Treatment:                                                DATE: 08/14/22 Therapeutic Exercise: NuStep L7 x 5 min with UE/LE while taking subjective Seated hamstring stretch 2 x 20 sec each LTR x 10 Supine piriformis stretch 2 x 20 sec Bridge 2 x 10 90-90 alternating foot tap 2 x 10 Side clamshell with red 2 x 15 each Sidelying thoracolumbar rotation x 10 each Pallof press FM 13# 2 x 10 each Deadlift with 30# from 4" box 2 x 10    PATIENT EDUCATION:  Education details: HEP update Person educated: Patient Education method: Explanation, Demonstration, Tactile cues, Verbal cues Education comprehension: verbalized understanding, returned demonstration, verbal cues required, tactile cues required, and needs further education   HOME EXERCISE PROGRAM: Access Code: [email or web address]      ASSESSMENT: CLINICAL IMPRESSION: Patient tolerated therapy well with no adverse effects. He is reporting improvement in his functional ability and making progress with his strength and mobility. He does continue to exhibit gross core strength deficits with low back pain. Therapy continues to focus on progressing spinal mobility and core stabilization and hip strength with good tolerance. Updated his HEP this visit to progress strengthening for home. Patient would benefit from continued skilled PT to progress his mobility and strength in order to reduce pain and maximize functional ability, so will extend PT POC.      OBJECTIVE IMPAIRMENTS: decreased activity tolerance, decreased ROM, decreased strength, impaired flexibility, postural dysfunction, and pain.    ACTIVITY LIMITATIONS: carrying, lifting, bending, sitting, standing, bed mobility, and locomotion level   PARTICIPATION LIMITATIONS: driving and occupation   PERSONAL FACTORS: Fitness, Past/current experiences, and Time since onset of injury/illness/exacerbation are also affecting patient's functional outcome.      GOALS: Goals reviewed with patient? Yes   SHORT TERM GOALS: Target date: 08/09/2022   Patient will be I with initial HEP in order to progress with therapy. Baseline: HEP provided at eval Goal status: MET Pt reports adherence 08/07/22   2.  PT will review FOTO with patient by 3rd visit in order to understand expected progress and outcome with therapy. Baseline:  08/14/2022: reviewed Goal status: MET   LONG TERM GOALS: Target date: 10/09/2022   Patient will be I with final  HEP to maintain progress from PT. Baseline: HEP provided at eval 08/28/2022: progressing Goal status: ONGOING   2.  Patient will report >/= 64% status on FOTO to indicate improved functional ability. Baseline: 52% functional status 08/21/2022: 54% Goal status: ONGOING   3.  Patient will demonstrate DLLT to 45 deg in order to indicate improve core strength and lumbopelvic control to improve lifting ability and decrease pain Baseline: unable to perform DLLT 08/28/2022: unable and continues to cause pain Goal status: ONGOING   4.  Patient will demonstrate hip strength grossly >/= 4/5 MMT in order to improve tolerance for standing and getting in/out of work truck Baseline: 3/5 MMT 08/28/2022: grossly 4-/5 MMT Goal status: ONGOING   5. Patient will report  low back pain </= 1/10 pain in order to reduce functional limitations with work related tasks            Baseline: 2-3/10 pain 08/28/2022: continues to report pain with activity            Goal status: ONGOING   6. Patient will exhibit a 25% improvement in lumbar AROM in order to improve bed mobility and reduce tension with movement in the morning            Baseline: see limitations in lumbar AROM above 08/28/2022: limitations noted above            Goal status: ONGOING     PLAN: PT FREQUENCY: 1x/week   PT DURATION: 6 weeks   PLANNED INTERVENTIONS: Therapeutic exercises, Therapeutic activity, Neuromuscular re-education, Balance training, Gait training, Patient/Family education, Self Care, Joint mobilization, Joint manipulation, Aquatic Therapy, Dry Needling, Electrical stimulation, Spinal manipulation, Spinal mobilization, Cryotherapy, Moist heat, Manual therapy, and Re-evaluation.   PLAN FOR NEXT SESSION: Review HEP and progress PRN, manual/dry needling for lumbar paraspinals, continue with lumbar and hip/hamstring stretching, progress core stabilization and hip strengthening, lifting mechanics   Rosana Hoes, PT, DPT, LAT,  ATC 08/28/22  5:07 PM Phone: [phone] Fax: [phone]

## 2022-08-27 NOTE — Progress Notes (Unsigned)
    [facility] [address] [place] [address] Phone: [phone]   Assessment and Plan:     There are no diagnoses linked to this encounter.  ***   Pertinent previous records reviewed include ***   Follow Up: ***     Subjective:   I, Alexis Norris, am serving as a Neurosurgeon for Doctor Alexis Norris   Chief Complaint: low back pain    HPI:    07/03/2022 Patient is a 62 year old male complaining of low back pain. Patient states that pain started in December , felt like his lower back would spasm, pain has progressively gotten worst, advil helps, pain is radiating down his hamstring and butt, he feels the pain in his spine, no numbness or tingling, intermittent pain when he walks ,    07/24/2022 Patient states doing better than before. Medicine started helping about 3-4 days in. After 2 weeks tried to stop for 2-3 days and pain came back but not as intense. Takes medication when working. Can still feel discomfort in back, but intensity of pain has definitely decreased  08/28/2022 Patient states      Relevant Historical Information: Hypertension, GERD, hypothyroidism  Additional pertinent review of systems negative.   Current Outpatient Medications:    Colchicine 0.6 MG CAPS, Day 1: Take 2 caps, then 1 cap an hour later. Day 2 and beyond: 1 cap daily (Patient not taking: Reported on 08/01/2022), Disp: 30 capsule, Rfl: 0   cyclobenzaprine (FLEXERIL) 5 MG tablet, Take 1 tablet (5 mg total) by mouth at bedtime., Disp: 30 tablet, Rfl: 0   levothyroxine (SYNTHROID) 125 MCG tablet, TAKE 2 TABLETS DAILY BEFOREBREAKFAST, Disp: 180 tablet, Rfl: 0   losartan (COZAAR) 100 MG tablet, TAKE 1 TABLET DAILY., Disp: 100 tablet, Rfl: 3   meloxicam (MOBIC) 15 MG tablet, Take 1 tablet (15 mg total) by mouth daily., Disp: 30 tablet, Rfl: 0   predniSONE (DELTASONE) 5 MG tablet, Take 5 mg by mouth daily., Disp: , Rfl:    tadalafil (CIALIS) 5 MG  tablet, Take 1 tablet (5 mg total) by mouth daily., Disp: 90 tablet, Rfl: 3   Objective:     There were no vitals filed for this visit.    There is no height or weight on file to calculate BMI.    Physical Exam:    ***   Electronically signed by:  Alexis Norris [facility] 7:18 AM 08/27/22

## 2022-08-28 ENCOUNTER — Other Ambulatory Visit: Payer: Self-pay

## 2022-08-28 ENCOUNTER — Ambulatory Visit: Payer: 59 | Admitting: Sports Medicine

## 2022-08-28 ENCOUNTER — Encounter: Payer: Self-pay | Admitting: Physical Therapy

## 2022-08-28 ENCOUNTER — Ambulatory Visit: Payer: 59 | Attending: Sports Medicine | Admitting: Physical Therapy

## 2022-08-28 VITALS — BP 130/78 | HR 89 | Ht 76.0 in | Wt 285.0 lb

## 2022-08-28 DIAGNOSIS — M5459 Other low back pain: Secondary | ICD-10-CM | POA: Diagnosis present

## 2022-08-28 DIAGNOSIS — G8929 Other chronic pain: Secondary | ICD-10-CM | POA: Diagnosis not present

## 2022-08-28 DIAGNOSIS — M545 Low back pain, unspecified: Secondary | ICD-10-CM

## 2022-08-28 DIAGNOSIS — M6281 Muscle weakness (generalized): Secondary | ICD-10-CM | POA: Insufficient documentation

## 2022-08-28 DIAGNOSIS — Z1589 Genetic susceptibility to other disease: Secondary | ICD-10-CM

## 2022-08-28 DIAGNOSIS — M5136 Other intervertebral disc degeneration, lumbar region: Secondary | ICD-10-CM

## 2022-08-28 NOTE — Patient Instructions (Addendum)
Good to see you  Tylenol [phone] mg 2-3 times a day for pain relief  Continue HEP and PT  As needed follow up  May gradually re start golf and other activities as tolerated

## 2022-08-28 NOTE — Patient Instructions (Signed)
Access Code: [email or web address] URL: [email or web address] Date: 08/28/2022 Prepared by: Crystallynn Noorani  Exercises - Supine Lower Trunk Rotation  - 1 x daily - 10 reps - Supine Piriformis Stretch with Foot on Ground  - 1 x daily - 3 reps - 30 seconds hold - Supine Figure 4 Piriformis Stretch  - 1 x daily - 3 reps - 30 seconds hold - Bridge  - 1 x daily - 2 sets - 10 reps - Straight Leg Raise  - 1 x daily - 2 sets - 10 reps - Clam with Resistance  - 1 x daily - 2 sets - 15 reps - Sidelying Thoracic Lumbar Rotation  - 1 x daily - 10 reps - Seated Hamstring Stretch  - 1 x daily - 3 reps - 30 seconds hold - Sit to Stand Without Arm Support  - 1 x daily - 3 sets - 10 reps - Standing Anti-Rotation Press with Anchored Resistance  - 1 x daily - 2 sets - 10 reps - Standing Gastroc Stretch at Counter  - 1 x daily - 3 reps - 30 seconds hold 

## 2022-09-10 NOTE — Therapy (Signed)
OUTPATIENT PHYSICAL THERAPY TREATMENT NOTE   Patient Name: Alexis Norris MRN: [id number] DOB:21-Jul-1960, 62 y.o., male Today's Date: 09/11/2022   PCP: Ardith Dark, MD  REFERRING PROVIDER: Richardean Sale, DO    END OF SESSION:   PT End of Session - 09/11/22 1623     Visit Number 8    Number of Visits 12    Date for PT Re-Evaluation 10/09/22    Authorization Type [employer]    PT Start Time 1616    PT Stop Time 1655    PT Time Calculation (min) 39 min    Activity Tolerance Patient tolerated treatment well    Behavior During Therapy [facility] for tasks assessed/performed                   Past Medical History:  Diagnosis Date   DYSLIPIDEMIA 01/13/2007   no medicines    HYPERTENSION 01/13/2007   HYPOTHYROIDISM, 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   Past Surgical History:  Procedure Laterality Date   I-131 therapy  09/1997   maybe 2000-2001   INGUINAL HERNIA REPAIR Right 09/28/2013   Procedure: LAPAROSCOPIC RIGHT  INGUINAL HERNIA;  Surgeon: Axel Filler, MD;  Location: [facility] OR;  Service: General;  Laterality: Right;   INSERTION OF MESH Right 09/28/2013   Procedure: INSERTION OF MESH;  Surgeon: Axel Filler, MD;  Location: [facility] OR;  Service: General;  Laterality: Right;   right knuckle     pin placed in high school   Patient Active Problem List   Diagnosis Date Noted   Low back pain 08/01/2022   Vitamin D deficiency 08/01/2022   Diverticulosis 04/17/2022   GERD (gastroesophageal reflux disease) 04/17/2022   Erectile dysfunction 04/11/2021   Diffuse cutaneous mucinosis 04/10/2020   Gout 02/06/2016   Allergic rhinitis, cause unspecified 08/26/2011   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   Dyslipidemia 01/13/2007   Essential hypertension 01/13/2007    REFERRING DIAG: Low back pain, unspecified back pain laterality, unspecified chronicity, unspecified whether sciatica present   THERAPY DIAG:  Other low back pain  Muscle weakness  (generalized)  Rationale for Evaluation and Treatment Rehabilitation  PERTINENT HISTORY: See PMH above   PRECAUTIONS: None   SUBJECTIVE:                                                                                                                                                                                     SUBJECTIVE STATEMENT: Patient reports his back is feeling good now. He does have a right wrist injury that he saw the doctor for today.  PAIN:  Are you having pain? Yes:  NPRS scale: 0/10 Pain location: Low back Pain description: Ache, spasm Aggravating factors:  Sitting, driving, moving in the morning Relieving factors: Tylenol   OBJECTIVE: (objective measures completed at initial evaluation unless otherwise dated) DIAGNOSTIC FINDINGS:  X-ray lumbar 3/6/2024L FINDINGS: There is no evidence of lumbar spine fracture. Alignment is normal. Intervertebral disc spaces are maintained. Large amount of osteophyte formation is noted extending from L2-3 to L5-S1. IMPRESSION: Multilevel degenerative changes as described above. No acute abnormality seen.   PATIENT SURVEYS:  FOTO 52% functional status  08/21/2022: 54%   MUSCLE LENGTH: Flexibility deficit of bilateral hamstrings and hips   POSTURE:             Loss of lumbar lordosis   PALPATION: Mildly tender to lumbar paraspinals, CPA hypomobility and discomfort   LUMBAR ROM:    AROM eval 08/28/2022  Flexion 50% - hamstring tightness 75%  Extension < 25% 25%  Right lateral flexion 25% 50%  Left lateral flexion 50% 50%  Right rotation 75% 75%  Left rotation 50% 75%   (Blank rows = not tested)   LOWER EXTREMITY MMT:      MMT  Right eval Left eval Rt / Lt 07/31/2022 Rt / Lt 08/28/2022  Hip flexion 4 4    Hip extension 3 3  4- / 4-  Hip abduction 3 3 3+ / 3+ 4- / 4-  Hip adduction        Hip internal rotation        Hip external rotation        Knee flexion 5 5    Knee extension 5 5    Ankle dorsiflexion         Ankle plantarflexion        Ankle inversion        Ankle eversion         (Blank rows = not tested)   LUMBAR SPECIAL TESTS:  Lumbar radicular testing negative   FUNCTIONAL TESTS:  Unable to hold DLLT due to core weakness   GAIT: Assistive device utilized: None Level of assistance: Complete Independence Comments: WFL     TODAY'S TREATMENT:     [facility] Adult PT Treatment:                                                DATE: 09/11/22 Therapeutic Exercise: LTR x 10 Supine piriformis stretch push / pull 2 x 15 sec each Seated hamstring stretch 3 x 20 sec each Windshield wiper stretch x 5 Bridge 2 x 10 SLR with abdominal engagement 2 x 10 Hooklying unilateral clamshell with blue 2 x 10 each Side clamshell with green 2 x 10 each Sidelying thoracolumbar rotation x 10 each Sit to stand 2 x 10   [facility] Adult PT Treatment:                                                DATE: 08/28/22 Therapeutic Exercise: NuStep L7 x 5 min with UE/LE while taking subjective Seated hamstring stretch 3 x 20 sec each Supine piriformis stretch push / pull 2 x 15 sec each LTR x 10 Bridge 2 x 10 SLR with abdominal engagement 2 x 10 Side clamshell with green 2 x 15 each Sidelying thoracolumbar rotation x 10 each Pallof press FM 13# 2 x 10  each Standing calf stretch 2 x 20 sec each  [facility] Adult PT Treatment:                                                DATE: 08/21/22 Therapeutic Exercise: NuStep L7 x 5 min with UE/LE while taking subjective Seated hamstring stretch 2 x 30 sec each Supine piriformis stretch push / pull 2 x 15 sec LTR x 10 Bridge 2 x 10 Side clamshell with green 2 x 15 each Sidelying thoracolumbar rotation x 10 each Cat cow x 5 Quadruped alternating hip extension with forearms on bolster x 10 Pallof press FM 13# 2 x 10 each Deadlift with 45# from 6" box 2 x 10   PATIENT EDUCATION:  Education details: HEP Person educated: Patient Education method: Programmer, multimedia, Demonstration,  Tactile cues, Verbal cues Education comprehension: verbalized understanding, returned demonstration, verbal cues required, tactile cues required, and needs further education   HOME EXERCISE PROGRAM: Access Code: [email or web address]      ASSESSMENT: CLINICAL IMPRESSION: Patient tolerated therapy well with no adverse effects. Therapy focused on continued progression of core stabilization and hip strengthening with good tolerance. Due to right wrist injury avoided any exercises that required gripping or putting weight through the wrists. No changes to HEP this visit. Patient would benefit from continued skilled PT to progress his mobility and strength in order to reduce pain and maximize functional ability.     OBJECTIVE IMPAIRMENTS: decreased activity tolerance, decreased ROM, decreased strength, impaired flexibility, postural dysfunction, and pain.    ACTIVITY LIMITATIONS: carrying, lifting, bending, sitting, standing, bed mobility, and locomotion level   PARTICIPATION LIMITATIONS: driving and occupation   PERSONAL FACTORS: Fitness, Past/current experiences, and Time since onset of injury/illness/exacerbation are also affecting patient's functional outcome.      GOALS: Goals reviewed with patient? Yes   SHORT TERM GOALS: Target date: 08/09/2022   Patient will be I with initial HEP in order to progress with therapy. Baseline: HEP provided at eval Goal status: MET Pt reports adherence 08/07/22   2.  PT will review FOTO with patient by 3rd visit in order to understand expected progress and outcome with therapy. Baseline:  08/14/2022: reviewed Goal status: MET   LONG TERM GOALS: Target date: 10/09/2022   Patient will be I with final HEP to maintain progress from PT. Baseline: HEP provided at eval 08/28/2022: progressing Goal status: ONGOING   2.  Patient will report >/= 64% status on FOTO to indicate improved functional ability. Baseline: 52% functional status 08/21/2022: 54% Goal status:  ONGOING   3.  Patient will demonstrate DLLT to 45 deg in order to indicate improve core strength and lumbopelvic control to improve lifting ability and decrease pain Baseline: unable to perform DLLT 08/28/2022: unable and continues to cause pain Goal status: ONGOING   4.  Patient will demonstrate hip strength grossly >/= 4/5 MMT in order to improve tolerance for standing and getting in/out of work truck Baseline: 3/5 MMT 08/28/2022: grossly 4-/5 MMT Goal status: ONGOING   5. Patient will report low back pain </= 1/10 pain in order to reduce functional limitations with work related tasks            Baseline: 2-3/10 pain 08/28/2022: continues to report pain with activity            Goal status: ONGOING   6. Patient will  exhibit a 25% improvement in lumbar AROM in order to improve bed mobility and reduce tension with movement in the morning            Baseline: see limitations in lumbar AROM above 08/28/2022: limitations noted above            Goal status: ONGOING     PLAN: PT FREQUENCY: 1x/week   PT DURATION: 6 weeks   PLANNED INTERVENTIONS: Therapeutic exercises, Therapeutic activity, Neuromuscular re-education, Balance training, Gait training, Patient/Family education, Self Care, Joint mobilization, Joint manipulation, Aquatic Therapy, Dry Needling, Electrical stimulation, Spinal manipulation, Spinal mobilization, Cryotherapy, Moist heat, Manual therapy, and Re-evaluation.   PLAN FOR NEXT SESSION: Review HEP and progress PRN, manual/dry needling for lumbar paraspinals, continue with lumbar and hip/hamstring stretching, progress core stabilization and hip strengthening, lifting mechanics   Rosana Hoes, PT, DPT, LAT, ATC 09/11/22  4:57 PM Phone: [phone] Fax: [phone]

## 2022-09-11 ENCOUNTER — Ambulatory Visit: Payer: 59 | Admitting: Sports Medicine

## 2022-09-11 ENCOUNTER — Other Ambulatory Visit: Payer: Self-pay

## 2022-09-11 ENCOUNTER — Encounter: Payer: Self-pay | Admitting: Physical Therapy

## 2022-09-11 ENCOUNTER — Ambulatory Visit: Payer: 59 | Admitting: Physical Therapy

## 2022-09-11 VITALS — BP 136/80 | HR 76 | Ht 76.0 in | Wt 284.0 lb

## 2022-09-11 DIAGNOSIS — M25531 Pain in right wrist: Secondary | ICD-10-CM

## 2022-09-11 DIAGNOSIS — M6281 Muscle weakness (generalized): Secondary | ICD-10-CM

## 2022-09-11 DIAGNOSIS — M5459 Other low back pain: Secondary | ICD-10-CM | POA: Diagnosis not present

## 2022-09-11 MED ORDER — MELOXICAM 15 MG PO TABS: 15.0000 mg | ORAL_TABLET | Freq: Every day | ORAL | 0 refills | Status: DC

## 2022-09-11 NOTE — Progress Notes (Signed)
[facility] [address] [place] [address] Phone: [phone]   Assessment and Plan:     1. 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  Likely flared due to patient's physical activity at work - Recommend using thumb spica brace to decrease range of motion and flare of pain and thumb.  Recommend using brace for 2 weeks and then gradually discontinuing - Start meloxicam 15 mg daily x2 weeks.  If still having pain after 2 weeks, complete 3rd-week of meloxicam. May use remaining meloxicam as needed once daily for pain control.  Do not to use additional NSAIDs while taking meloxicam.  May use Tylenol [phone] mg 2 to 3 times a day for breakthrough pain. - Start HEP for first and second dorsal wrist compartments  Other orders - meloxicam (MOBIC) 15 MG tablet; Take 1 tablet (15 mg total) by mouth daily.    Pertinent previous records reviewed include none   Follow Up: 3 to 4 weeks for reevaluation.  If no improvement or worsening of symptoms, could consider ultrasound and CSI   Subjective:   I, Alexis Norris, am serving as a Neurosurgeon for Alexis Norris  Chief Complaint: right wrist pain  HPI:   09/11/22 Patient is a 62 year old male complaining of right wrist pain. Patient states that he has had pain for 10 days things he over worked it while at work, no true MOI , pain has been increasing , he has no power steering, the more he uses it swelling happens and decreased ROM and strength, no meds for the pain , by the end of the day his wrist is really inflamed sore and weak, no numbness and tingling, thinks he just sprained , hx of steroid shots on his forearm he looks like he had a reaction to the shot   Relevant Historical Information: Hypertension,  GERD  Additional pertinent review of systems negative.   Current Outpatient Medications:    Colchicine 0.6 MG CAPS, Day 1: Take 2 caps, then 1 cap an hour later. Day 2 and beyond: 1 cap daily, Disp: 30 capsule, Rfl: 0   cyclobenzaprine (FLEXERIL) 5 MG tablet, Take 1 tablet (5 mg total) by mouth at bedtime., Disp: 30 tablet, Rfl: 0   levothyroxine (SYNTHROID) 125 MCG tablet, TAKE 2 TABLETS DAILY BEFOREBREAKFAST, Disp: 180 tablet, Rfl: 0   losartan (COZAAR) 100 MG tablet, TAKE 1 TABLET DAILY., Disp: 100 tablet, Rfl: 3   meloxicam (MOBIC) 15 MG tablet, Take 1 tablet (15 mg total) by mouth daily., Disp: 30 tablet, Rfl: 0   meloxicam (MOBIC) 15 MG tablet, Take 1 tablet (15 mg total) by mouth daily., Disp: 30 tablet, Rfl: 0   predniSONE (DELTASONE) 5 MG tablet, Take 5 mg by mouth daily., Disp: , Rfl:    tadalafil (CIALIS) 5 MG tablet, Take 1 tablet (5 mg total) by mouth daily., Disp: 90 tablet, Rfl: 3   Objective:     Vitals:   09/11/22 1040  BP: 136/80  Pulse: 76  SpO2: 96%  Weight: 284 lb (128.8 kg)  Height: 6\' 4"  (1.93 m)      Body mass index is 34.57 kg/m.    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  No open lesion. Generalized edema along radial side of forearm along first and second dorsal wrist compartments ROM  Ext 90, flexion70, radial/ulnar deviation 30 nttp over the snuff box, dorsal carpals, volar carpals, radial styloid, ulnar styloid, 1st mcp, tfcc Negative Tinel's Positive finklestein Neg tfcc bounce test No pain with resisted ext, flex or deviation    Electronically signed by:  Alexis Norris [facility] 11:08 AM 09/11/22

## 2022-09-11 NOTE — Patient Instructions (Signed)
Recommend getting a thumb spica brace Does not limit wrist motion  - Start meloxicam 15 mg daily x2 weeks.  If still having pain after 2 weeks, complete 3rd-week of meloxicam. May use remaining meloxicam as needed once daily for pain control.  Do not to use additional NSAIDs while taking meloxicam.  May use Tylenol [phone] mg 2 to 3 times a day for breakthrough pain. Wrist HEP  3-4 week follow up

## 2022-09-25 NOTE — Progress Notes (Signed)
[facility] [address] [place] [address] Phone: [phone]   Assessment and Plan:    1. 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  Patient experience benefit when wearing wrist brace and completing course of meloxicam - May discontinue meloxicam at this time and use remainder as needed - May wean off of wrist brace, but may continue to use it if pain flares - May use Tylenol as needed for day-to-day pain relief   Pertinent previous records reviewed include none   Follow Up: As needed if no improvement or worsening of symptoms.  May wean off of physical therapy for low back and follow-up for low back as needed.  As patient increases physical activity if his old knee injury flares up, he may follow-up for new evaluation of knee pain   Subjective:   I, Alexis Norris, am serving as a Neurosurgeon for Doctor Richardean Sale   Chief Complaint: right wrist pain   HPI:    09/11/22 Patient is a 62 year old male complaining of right wrist pain. Patient states that he has had pain for 10 days things he over worked it while at work, no true MOI , pain has been increasing , he has no power steering, the more he uses it swelling happens and decreased ROM and strength, no meds for the pain , by the end of the day his wrist is really inflamed sore and weak, no numbness and tingling, thinks he just sprained , hx of steroid shots on his forearm he looks like he had a reaction to the shot   10/02/2022 Patient states has been wearing his brace for work , wrist is good but thumb had a flare where it goes numb but when he doesn't wear it he is fine, he is having a little ankle pain, but all other body parts are pain free    Relevant Historical Information: Hypertension,  GERD   Additional pertinent review of systems negative.   Current Outpatient Medications:    Colchicine 0.6 MG CAPS, Day 1: Take 2 caps, then 1 cap an hour later. Day 2 and beyond: 1 cap daily, Disp: 30 capsule, Rfl: 0   cyclobenzaprine (FLEXERIL) 5 MG tablet, Take 1 tablet (5 mg total) by mouth at bedtime., Disp: 30 tablet, Rfl: 0   levothyroxine (SYNTHROID) 125 MCG tablet, TAKE 2 TABLETS DAILY BEFOREBREAKFAST, Disp: 180 tablet, Rfl: 0   losartan (COZAAR) 100 MG tablet, TAKE 1 TABLET DAILY., Disp: 100 tablet, Rfl: 3   meloxicam (MOBIC) 15 MG tablet, Take 1 tablet (15 mg total) by mouth daily., Disp: 30 tablet, Rfl: 0   meloxicam (MOBIC) 15 MG tablet, Take 1 tablet (15 mg total) by mouth daily., Disp: 30 tablet, Rfl: 0   predniSONE (DELTASONE) 5 MG tablet, Take 5 mg by mouth daily., Disp: , Rfl:    tadalafil (CIALIS) 5 MG tablet, Take 1 tablet (5 mg total) by mouth daily., Disp: 90 tablet, Rfl: 3   Objective:     Vitals:   10/02/22 1430  BP: 130/80  Pulse: 78  SpO2: 98%  Weight: 287 lb (130.2 kg)  Height: 6\' 4"  (1.93 m)      Body mass index is 34.93 kg/m.    Physical Exam:    General: Appears well, nad, nontoxic and pleasant Neuro:sensation intact,  strength is 5/5 with df/pf/inv/ev, muscle tone wnl Skin:no susupicious lesions or rashes   Right wrist:   No deformity Skin lightening with pinkish base over area where patient states he has had steroid injections in the past from dermatology.  No open lesion. mild edema along radial side of forearm along first and second dorsal wrist compartments ROM  Ext 90, flexion70, radial/ulnar deviation 30 nttp over the snuff box, dorsal carpals, volar carpals, radial styloid, ulnar styloid, 1st mcp, tfcc Negative Tinel's Negative finklestein Neg tfcc bounce test No pain with resisted ext, flex or deviation     Electronically signed by:  Alexis Norris [facility] 2:59 PM 10/02/22

## 2022-09-27 ENCOUNTER — Other Ambulatory Visit: Payer: Self-pay | Admitting: *Deleted

## 2022-09-27 DIAGNOSIS — Z1211 Encounter for screening for malignant neoplasm of colon: Secondary | ICD-10-CM

## 2022-10-01 NOTE — Therapy (Signed)
OUTPATIENT PHYSICAL THERAPY TREATMENT NOTE   Patient Name: Alexis Norris MRN: [id number] DOB:Jun 04, 1960, 62 y.o., male Today's Date: 10/02/2022   PCP: Ardith Dark, MD  REFERRING PROVIDER: Richardean Sale, DO    END OF SESSION:   PT End of Session - 10/02/22 1531     Visit Number 9    Number of Visits 12    Date for PT Re-Evaluation 10/30/22    Authorization Type [employer]    PT Start Time 1531    PT Stop Time 1615    PT Time Calculation (min) 44 min    Activity Tolerance Patient tolerated treatment well    Behavior During Therapy [facility] for tasks assessed/performed                    Past Medical History:  Diagnosis Date   DYSLIPIDEMIA 01/13/2007   no medicines    HYPERTENSION 01/13/2007   HYPOTHYROIDISM, 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   Past Surgical History:  Procedure Laterality Date   I-131 therapy  09/1997   maybe 2000-2001   INGUINAL HERNIA REPAIR Right 09/28/2013   Procedure: LAPAROSCOPIC RIGHT  INGUINAL HERNIA;  Surgeon: Axel Filler, MD;  Location: [facility] OR;  Service: General;  Laterality: Right;   INSERTION OF MESH Right 09/28/2013   Procedure: INSERTION OF MESH;  Surgeon: Axel Filler, MD;  Location: [facility] OR;  Service: General;  Laterality: Right;   right knuckle     pin placed in high school   Patient Active Problem List   Diagnosis Date Noted   Low back pain 08/01/2022   Vitamin D deficiency 08/01/2022   Diverticulosis 04/17/2022   GERD (gastroesophageal reflux disease) 04/17/2022   Erectile dysfunction 04/11/2021   Diffuse cutaneous mucinosis 04/10/2020   Gout 02/06/2016   Allergic rhinitis, cause unspecified 08/26/2011   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   Dyslipidemia 01/13/2007   Essential hypertension 01/13/2007    REFERRING DIAG: Low back pain, unspecified back pain laterality, unspecified chronicity, unspecified whether sciatica present   THERAPY DIAG:  Other low back pain  Muscle weakness  (generalized)  Rationale for Evaluation and Treatment Rehabilitation  PERTINENT HISTORY: See PMH above   PRECAUTIONS: None   SUBJECTIVE:                                                                                                                                                                                     SUBJECTIVE STATEMENT: Patient reports he is doing good. Denies any spasms but is having some soreness in the hips and thighs likely from recent travels. His wrist is feeling better but he is having some pain in his feet.   PAIN:  Are you  having pain? Yes:  NPRS scale: 0/10 Pain location: Low back Pain description: Ache, spasm Aggravating factors: Sitting, driving, moving in the morning Relieving factors: Tylenol   OBJECTIVE: (objective measures completed at initial evaluation unless otherwise dated) DIAGNOSTIC FINDINGS:  X-ray lumbar 3/6/2024L FINDINGS: There is no evidence of lumbar spine fracture. Alignment is normal. Intervertebral disc spaces are maintained. Large amount of osteophyte formation is noted extending from L2-3 to L5-S1. IMPRESSION: Multilevel degenerative changes as described above. No acute abnormality seen.   PATIENT SURVEYS:  FOTO 52% functional status  08/21/2022: 54%  10/02/2022: 59%   MUSCLE LENGTH: Flexibility deficit of bilateral hamstrings and hips   POSTURE:             Loss of lumbar lordosis   PALPATION: Mildly tender to lumbar paraspinals, CPA hypomobility and discomfort   LUMBAR ROM:    AROM eval 08/28/2022 10/02/2022  Flexion 50% - hamstring tightness 75% 75% - hamstring tightness  Extension < 25% 25%   Right lateral flexion 25% 50%   Left lateral flexion 50% 50%   Right rotation 75% 75%   Left rotation 50% 75%    (Blank rows = not tested)   LOWER EXTREMITY MMT:      MMT  Right eval Left eval Rt / Lt 07/31/2022 Rt / Lt 08/28/2022 Rt / Lt 10/02/2022  Hip flexion 4 4     Hip extension 3 3  4- / 4- 4- / 4-  Hip abduction 3  3 3+ / 3+ 4- / 4- 4- / 4-  Hip adduction         Hip internal rotation         Hip external rotation         Knee flexion 5 5     Knee extension 5 5     Ankle dorsiflexion         Ankle plantarflexion         Ankle inversion         Ankle eversion          (Blank rows = not tested)   LUMBAR SPECIAL TESTS:  Lumbar radicular testing negative   FUNCTIONAL TESTS:  Unable to hold DLLT due to core weakness   GAIT: Assistive device utilized: None Level of assistance: Complete Independence Comments: WFL     TODAY'S TREATMENT:     [facility] Adult PT Treatment:                                                DATE: 10/02/22 Therapeutic Exercise: Seated hamstring stretch 2 x 20 sec each LTR x 10 Supine piriformis stretch push / pull 2 x 20 sec each Bridge 2 x 10 SLR with abdominal engagement 2 x 10 Hooklying unilateral clamshell with blue 2 x 10 each Side clamshell with green 2 x 10 each Sidelying thoracolumbar rotation x 10 each Sit to stand 2 x 10 Pallof press with blue 2 x 10 each   [facility] Adult PT Treatment:                                                DATE: 09/11/22 Therapeutic Exercise: LTR x 10 Supine piriformis stretch push / pull 2 x  15 sec each Seated hamstring stretch 3 x 20 sec each Windshield wiper stretch x 5 Bridge 2 x 10 SLR with abdominal engagement 2 x 10 Hooklying unilateral clamshell with blue 2 x 10 each Side clamshell with green 2 x 10 each Sidelying thoracolumbar rotation x 10 each Sit to stand 2 x 10  [facility] Adult PT Treatment:                                                DATE: 08/28/22 Therapeutic Exercise: NuStep L7 x 5 min with UE/LE while taking subjective Seated hamstring stretch 3 x 20 sec each Supine piriformis stretch push / pull 2 x 15 sec each LTR x 10 Bridge 2 x 10 SLR with abdominal engagement 2 x 10 Side clamshell with green 2 x 15 each Sidelying thoracolumbar rotation x 10 each Pallof press FM 13# 2 x 10 each Standing calf stretch 2 x 20  sec each   PATIENT EDUCATION:  Education details: POC extension, HEP Person educated: Patient Education method: Explanation, Demonstration, Tactile cues, Verbal cues Education comprehension: verbalized understanding, returned demonstration, verbal cues required, tactile cues required, and needs further education   HOME EXERCISE PROGRAM: Access Code: [email or web address]      ASSESSMENT: CLINICAL IMPRESSION: Patient tolerated therapy well with no adverse effects. He continues to report improvement in functional ability on FOTO and denies any low back spasms with activity. He does exhibit continue mobility and strength deficits. Therapy continues to focus on lumbar mobility and addressing flexibility deficits, and progressing core and hip strengthening. He tolerates exercises well and denies any low back spasm or pain with exercises. No changes made to HEP this visit. Patient would benefit from continued skilled PT to progress his mobility and strength in order to reduce pain and maximize functional ability, so will extend PT POC for 4 more weeks.     OBJECTIVE IMPAIRMENTS: decreased activity tolerance, decreased ROM, decreased strength, impaired flexibility, postural dysfunction, and pain.    ACTIVITY LIMITATIONS: carrying, lifting, bending, sitting, standing, bed mobility, and locomotion level   PARTICIPATION LIMITATIONS: driving and occupation   PERSONAL FACTORS: Fitness, Past/current experiences, and Time since onset of injury/illness/exacerbation are also affecting patient's functional outcome.      GOALS: Goals reviewed with patient? Yes   SHORT TERM GOALS: Target date: 08/09/2022   Patient will be I with initial HEP in order to progress with therapy. Baseline: HEP provided at eval Goal status: MET Pt reports adherence 08/07/22   2.  PT will review FOTO with patient by 3rd visit in order to understand expected progress and outcome with therapy. Baseline:  08/14/2022: reviewed Goal status:  MET   LONG TERM GOALS: Target date: 10/30/2022   Patient will be I with final HEP to maintain progress from PT. Baseline: HEP provided at eval 08/28/2022: progressing 10/02/2022: ongong Goal status: ONGOING   2.  Patient will report >/= 64% status on FOTO to indicate improved functional ability. Baseline: 52% functional status 08/21/2022: 54% 10/02/2022: 59% Goal status: ONGOING   3.  Patient will demonstrate DLLT to 45 deg in order to indicate improve core strength and lumbopelvic control to improve lifting ability and decrease pain Baseline: unable to perform DLLT 08/28/2022: unable and continues to cause pain Goal status: DEFERRED   4.  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 Patient will report low back pain </= 1/10 pain in order to reduce functional limitations with work related tasks            Baseline: 2-3/10 pain 08/28/2022: continues to report pain with activity 10/02/2022: minimal low back pain with activity, denies spams            Goal status: ONGOING   6. Patient will exhibit a 25% improvement in lumbar AROM in order to improve bed mobility and reduce tension with movement in the morning            Baseline: see limitations in lumbar AROM above 08/28/2022: limitations noted above 10/02/2022: continued limited noted above            Goal status: ONGOING     PLAN: PT FREQUENCY: 1x/week   PT DURATION: 4 weeks   PLANNED INTERVENTIONS: Therapeutic exercises, Therapeutic activity, Neuromuscular re-education, Balance training, Gait training, Patient/Family education, Self Care, Joint mobilization, Joint manipulation, Aquatic Therapy, Dry Needling, Electrical stimulation, Spinal manipulation, Spinal mobilization, Cryotherapy, Moist heat, Manual therapy, and Re-evaluation.   PLAN FOR NEXT  SESSION: Review HEP and progress PRN, manual/dry needling for lumbar paraspinals, continue with lumbar and hip/hamstring stretching, progress core stabilization and hip strengthening, lifting mechanics   Rosana Hoes, PT, DPT, LAT, ATC 10/02/22  4:23 PM Phone: [phone] Fax: [phone]

## 2022-10-02 ENCOUNTER — Other Ambulatory Visit: Payer: Self-pay

## 2022-10-02 ENCOUNTER — Other Ambulatory Visit: Payer: Self-pay | Admitting: *Deleted

## 2022-10-02 ENCOUNTER — Encounter: Payer: Self-pay | Admitting: Physical Therapy

## 2022-10-02 ENCOUNTER — Ambulatory Visit: Payer: 59 | Admitting: Sports Medicine

## 2022-10-02 ENCOUNTER — Ambulatory Visit: Payer: 59 | Attending: Sports Medicine | Admitting: Physical Therapy

## 2022-10-02 VITALS — BP 130/80 | HR 78 | Ht 76.0 in | Wt 287.0 lb

## 2022-10-02 DIAGNOSIS — K5790 Diverticulosis of intestine, part unspecified, without perforation or abscess without bleeding: Secondary | ICD-10-CM

## 2022-10-02 DIAGNOSIS — K625 Hemorrhage of anus and rectum: Secondary | ICD-10-CM

## 2022-10-02 DIAGNOSIS — M5459 Other low back pain: Secondary | ICD-10-CM | POA: Diagnosis present

## 2022-10-02 DIAGNOSIS — M6281 Muscle weakness (generalized): Secondary | ICD-10-CM

## 2022-10-02 DIAGNOSIS — M25531 Pain in right wrist: Secondary | ICD-10-CM

## 2022-10-02 NOTE — Patient Instructions (Signed)
Tylenol [phone] mg 2-3 times a day for pain relief  Discontinue meloxicam and use remainder as needed May ween off and eventually stop PT May ween off of wrist brace as tolerated As needed follow up

## 2022-10-22 NOTE — Therapy (Signed)
OUTPATIENT PHYSICAL THERAPY TREATMENT NOTE  DISCHARGE   Patient Name: Alexis Norris MRN: [id number] DOB:1961-01-15, 62 y.o., male Today's Date: 10/23/2022   PCP: Ardith Dark, MD  REFERRING PROVIDER: Richardean Sale, DO    END OF SESSION:   PT End of Session - 10/23/22 1528     Visit Number 10    Number of Visits 12    Date for PT Re-Evaluation 10/30/22    Authorization Type [employer]    PT Start Time 1530    PT Stop Time 1610    PT Time Calculation (min) 40 min    Activity Tolerance Patient tolerated treatment well    Behavior During Therapy [facility] for tasks assessed/performed                     Past Medical History:  Diagnosis Date   DYSLIPIDEMIA 01/13/2007   no medicines    HYPERTENSION 01/13/2007   HYPOTHYROIDISM, 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   Past Surgical History:  Procedure Laterality Date   I-131 therapy  09/1997   maybe 2000-2001   INGUINAL HERNIA REPAIR Right 09/28/2013   Procedure: LAPAROSCOPIC RIGHT  INGUINAL HERNIA;  Surgeon: Axel Filler, MD;  Location: [facility] OR;  Service: General;  Laterality: Right;   INSERTION OF MESH Right 09/28/2013   Procedure: INSERTION OF MESH;  Surgeon: Axel Filler, MD;  Location: [facility] OR;  Service: General;  Laterality: Right;   right knuckle     pin placed in high school   Patient Active Problem List   Diagnosis Date Noted   Low back pain 08/01/2022   Vitamin D deficiency 08/01/2022   Diverticulosis 04/17/2022   GERD (gastroesophageal reflux disease) 04/17/2022   Erectile dysfunction 04/11/2021   Diffuse cutaneous mucinosis 04/10/2020   Gout 02/06/2016   Allergic rhinitis, cause unspecified 08/26/2011   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   Dyslipidemia 01/13/2007   Essential hypertension 01/13/2007    REFERRING DIAG: Low back pain, unspecified back pain laterality, unspecified chronicity, unspecified whether sciatica present   THERAPY DIAG:  Other low back pain  Muscle weakness  (generalized)  Rationale for Evaluation and Treatment Rehabilitation  PERTINENT HISTORY: See PMH above   PRECAUTIONS: None   SUBJECTIVE:                                                                                                                                                                                     SUBJECTIVE STATEMENT: Patient reports he is doing good regarding his lower back but he does feel like he strained the other side of his right wrist. He sees the rheumatologist tomorrow.  PAIN:  Are you having pain? Yes:  NPRS scale:  0/10 Pain location: Low back Pain description: Ache, spasm Aggravating factors: Sitting, driving, moving in the morning Relieving factors: Tylenol   OBJECTIVE: (objective measures completed at initial evaluation unless otherwise dated) DIAGNOSTIC FINDINGS:  X-ray lumbar 3/6/2024L FINDINGS: There is no evidence of lumbar spine fracture. Alignment is normal. Intervertebral disc spaces are maintained. Large amount of osteophyte formation is noted extending from L2-3 to L5-S1. IMPRESSION: Multilevel degenerative changes as described above. No acute abnormality seen.   PATIENT SURVEYS:  FOTO 52% functional status  08/21/2022: 54%  10/02/2022: 59% 10/23/2022: 72%   MUSCLE LENGTH: Flexibility deficit of bilateral hamstrings and hips   POSTURE:             Loss of lumbar lordosis   PALPATION: Mildly tender to lumbar paraspinals, CPA hypomobility and discomfort   LUMBAR ROM:    AROM eval 08/28/2022 10/02/2022 10/23/2022  Flexion 50% - hamstring tightness 75% 75% - hamstring tightness 75%  Extension < 25% 25%  25%  Right lateral flexion 25% 50%  50%  Left lateral flexion 50% 50%  50%  Right rotation 75% 75%  75%  Left rotation 50% 75%  75%   (Blank rows = not tested)   LOWER EXTREMITY MMT:      MMT  Right eval Left eval Rt / Lt 07/31/2022 Rt / Lt 08/28/2022 Rt / Lt 10/02/2022 Rt / Lt 10/23/2022  Hip flexion 4 4      Hip extension 3 3   4- / 4- 4- / 4- 4- / 4-  Hip abduction 3 3 3+ / 3+ 4- / 4- 4- / 4- 4 / 4  Hip adduction          Hip internal rotation          Hip external rotation          Knee flexion 5 5      Knee extension 5 5      Ankle dorsiflexion          Ankle plantarflexion          Ankle inversion          Ankle eversion           (Blank rows = not tested)   LUMBAR SPECIAL TESTS:  Lumbar radicular testing negative   FUNCTIONAL TESTS:  Unable to hold DLLT due to core weakness   GAIT: Assistive device utilized: None Level of assistance: Complete Independence Comments: WFL     TODAY'S TREATMENT:     [facility] Adult PT Treatment:                                                DATE: 10/23/22 Therapeutic Exercise: NuStep L7 x 5 min with UE/LE while taking subjective Seated hamstring stretch 2 x 20 sec each LTR x 10 Supine piriformis stretch push / pull 2 x 20 sec each Bridge x 10 SLR with abdominal engagement x 10 Side clamshell with green x 15 each Sidelying thoracolumbar rotation x 10 each Sit to stand x 10 Pallof press with blue x 10 each   [facility] Adult PT Treatment:  DATE: 10/02/22 Therapeutic Exercise: Seated hamstring stretch 2 x 20 sec each LTR x 10 Supine piriformis stretch push / pull 2 x 20 sec each Bridge 2 x 10 SLR with abdominal engagement 2 x 10 Hooklying unilateral clamshell with blue 2 x 10 each Side clamshell with green 2 x 10 each Sidelying thoracolumbar rotation x 10 each Sit to stand 2 x 10 Pallof press with blue 2 x 10 each  [facility] Adult PT Treatment:                                                DATE: 09/11/22 Therapeutic Exercise: LTR x 10 Supine piriformis stretch push / pull 2 x 15 sec each Seated hamstring stretch 3 x 20 sec each Windshield wiper stretch x 5 Bridge 2 x 10 SLR with abdominal engagement 2 x 10 Hooklying unilateral clamshell with blue 2 x 10 each Side clamshell with green 2 x 10 each Sidelying thoracolumbar  rotation x 10 each Sit to stand 2 x 10   PATIENT EDUCATION:  Education details: HEP Person educated: Patient Education method: Programmer, multimedia, Demonstration, Actor cues, Verbal cues Education comprehension: verbalized understanding, returned demonstration, verbal cues required, tactile cues required, and needs further education   HOME EXERCISE PROGRAM: Access Code: [email or web address]      ASSESSMENT: CLINICAL IMPRESSION: Patient tolerated therapy well with no adverse effects. He has made great progress in therapy and reports improvement in his functional ability on FOTO. He does continue to exhibit some limitations in his lumbar mobility and core strength but these do not seem to impact his functional ability. He is independent with is HEP so will be formally discharged from physical therapy.     OBJECTIVE IMPAIRMENTS: decreased activity tolerance, decreased ROM, decreased strength, impaired flexibility, postural dysfunction, and pain.    ACTIVITY LIMITATIONS: carrying, lifting, bending, sitting, standing, bed mobility, and locomotion level   PARTICIPATION LIMITATIONS: driving and occupation   PERSONAL FACTORS: Fitness, Past/current experiences, and Time since onset of injury/illness/exacerbation are also affecting patient's functional outcome.      GOALS: Goals reviewed with patient? Yes   SHORT TERM GOALS: Target date: 08/09/2022   Patient will be I with initial HEP in order to progress with therapy. Baseline: HEP provided at eval Goal status: MET Pt reports adherence 08/07/22   2.  PT will review FOTO with patient by 3rd visit in order to understand expected progress and outcome with therapy. Baseline:  08/14/2022: reviewed Goal status: MET   LONG TERM GOALS: Target date: 10/30/2022   Patient will be I with final HEP to maintain progress from PT. Baseline: HEP provided at eval 08/28/2022: progressing 10/02/2022: ongong 10/23/2022: independent Goal status: MET   2.  Patient will  report >/= 64% status on FOTO to indicate improved functional ability. Baseline: 52% functional status 08/21/2022: 54% 10/02/2022: 59% 10/23/2022: 72% Goal status: MET   3.  Patient will demonstrate DLLT to 45 deg in order to indicate improve core strength and lumbopelvic control to improve lifting ability and decrease pain Baseline: unable to perform DLLT 08/28/2022: unable and continues to cause pain Goal status: DEFERRED   4.  Patient will demonstrate hip strength grossly >/= 4/5 MMT in order to improve tolerance for standing and getting in/out of work truck Baseline: 3/5 MMT 08/28/2022: grossly 4-/5 MMT 10/02/2022: grossly 4-/5 MMT 10/23/2022: see above Goal status: PARTIALLY  MET   5. Patient will report low back pain </= 1/10 pain in order to reduce functional limitations with work related tasks            Baseline: 2-3/10 pain 08/28/2022: continues to report pain with activity 10/02/2022: minimal low back pain with activity, denies spasms 10/23/2022: patient reports minimal pain with activity            Goal status: MET   6. Patient will exhibit a 25% improvement in lumbar AROM in order to improve bed mobility and reduce tension with movement in the morning            Baseline: see limitations in lumbar AROM above 08/28/2022: limitations noted above 10/02/2022: continued limited noted above 10/23/2022: see above            Goal status: PARTIALLY MET     PLAN: PT FREQUENCY: 1x/week   PT DURATION: 4 weeks   PLANNED INTERVENTIONS: Therapeutic exercises, Therapeutic activity, Neuromuscular re-education, Balance training, Gait training, Patient/Family education, Self Care, Joint mobilization, Joint manipulation, Aquatic Therapy, Dry Needling, Electrical stimulation, Spinal manipulation, Spinal mobilization, Cryotherapy, Moist heat, Manual therapy, and Re-evaluation.   PLAN FOR NEXT SESSION: NA - discharge   Rosana Hoes, PT, DPT, LAT, ATC 10/23/22  4:13 PM Phone: [phone] Fax:  [phone]   PHYSICAL THERAPY DISCHARGE SUMMARY  Visits from Start of Care: 10  Current functional level related to goals / functional outcomes: See above   Remaining deficits: See above   Education / Equipment: HEP   Patient agrees to discharge. Patient goals were partially met. Patient is being discharged due to being pleased with the current functional level.

## 2022-10-23 ENCOUNTER — Encounter: Payer: Self-pay | Admitting: Physical Therapy

## 2022-10-23 ENCOUNTER — Ambulatory Visit: Payer: 59 | Admitting: Physical Therapy

## 2022-10-23 ENCOUNTER — Other Ambulatory Visit: Payer: Self-pay

## 2022-10-23 DIAGNOSIS — M5459 Other low back pain: Secondary | ICD-10-CM | POA: Diagnosis not present

## 2022-10-23 DIAGNOSIS — M6281 Muscle weakness (generalized): Secondary | ICD-10-CM

## 2022-10-24 ENCOUNTER — Ambulatory Visit (INDEPENDENT_AMBULATORY_CARE_PROVIDER_SITE_OTHER): Payer: 59

## 2022-10-24 ENCOUNTER — Encounter: Payer: Self-pay | Admitting: Internal Medicine

## 2022-10-24 ENCOUNTER — Ambulatory Visit: Payer: 59 | Attending: Internal Medicine | Admitting: Internal Medicine

## 2022-10-24 VITALS — BP 149/80 | HR 66 | Resp 16 | Ht 73.75 in | Wt 280.0 lb

## 2022-10-24 DIAGNOSIS — M545 Low back pain, unspecified: Secondary | ICD-10-CM

## 2022-10-24 DIAGNOSIS — M25531 Pain in right wrist: Secondary | ICD-10-CM | POA: Diagnosis not present

## 2022-10-24 DIAGNOSIS — G8929 Other chronic pain: Secondary | ICD-10-CM | POA: Diagnosis not present

## 2022-10-24 DIAGNOSIS — R700 Elevated erythrocyte sedimentation rate: Secondary | ICD-10-CM

## 2022-10-24 LAB — SEDIMENTATION RATE: Sed Rate: 25 mm/h — ABNORMAL HIGH (ref 0–20)

## 2022-10-24 LAB — CBC WITH DIFFERENTIAL/PLATELET
Basophils Relative: 0.9 %
Eosinophils Absolute: 211 cells/uL (ref 15–500)
Eosinophils Relative: 3.7 %
Monocytes Relative: 6 %
Neutro Abs: 3471 cells/uL (ref 1500–7800)
Total Lymphocyte: 28.5 %

## 2022-10-24 NOTE — Progress Notes (Signed)
Office Visit Note  Patient: Alexis Norris             Date of Birth: 01/23/1961           MRN: [id number]             PCP: Ardith Dark, MD Referring: Richardean Sale, DO Visit Date: 10/24/2022 Occupation: Mail Carrier  Subjective:  New Patient (Initial Visit) (Patient states he is having joint pain in his right wrist, ankles, and feet.)   History of Present Illness: Alexis Norris is a 62 y.o. male here for evaluation of chronic joint pain in multiple areas associated elevated sedimentation rate and positive HLA-B27.  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  He has a chronic history of some pain and swelling affecting feet and ankles that was previously diagnosed as gout more than 5 years ago.  He was on treatment for this including allopurinol and colchicine but without significant impact on symptoms and if you never had any elevated uric acid blood testing monosodium urate crystals.  This treatment was discontinued a few months ago so far without any major flare.  He has had chronic low back pain for years pretty mild not significantly impacting activities or causing frequent nighttime awakening.  This has gotten a little bit worse over time but in the past few months experienced worsening evaluated sports medicine clinic treated with meloxicam and as needed Flexeril.  He noticed a significant benefit in his joint inflammation when on the meloxicam.  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  Oral NSAIDs and wrist brace quickly improve this but since stopping the meloxicam back to regularly as he has increased swelling again.  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  On review of previous chest x-ray imaging from 2018 also  shows pretty extensive thoracic spine anterior osteophyte processes.  Not currently on any specific medication for joint inflammation has a few minutes of stiffness every morning.  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 Had concern reported for some exophthalmos related to Graves' disease was referred to ophthalmology not yet seen the specialist for this.   Activities of Daily Living:  Patient reports morning stiffness for 5-15 minutes.   Patient Denies nocturnal pain.  Difficulty dressing/grooming: Denies Difficulty climbing stairs: Denies Difficulty getting out of chair: Denies Difficulty using hands for taps, buttons, cutlery, and/or writing: Denies  Review of Systems  Constitutional:  Negative for fatigue.  HENT:  Negative for mouth sores and mouth dryness.   Eyes:  Negative for dryness.  Respiratory:  Negative for shortness of breath.   Cardiovascular:  Negative for chest pain and palpitations.  Gastrointestinal:  Positive for blood in stool. Negative for constipation and diarrhea.  Endocrine: Negative for increased urination.  Genitourinary:  Negative for involuntary urination.  Musculoskeletal:  Positive for joint pain, gait problem, joint pain, joint swelling, myalgias, morning stiffness and myalgias. Negative for muscle weakness and muscle tenderness.  Skin:  Negative for color change, rash, hair loss and sensitivity to sunlight.  Allergic/Immunologic: Negative for susceptible to infections.  Neurological:  Negative for dizziness and headaches.  Hematological:  Negative for swollen glands.  Psychiatric/Behavioral:  Negative for depressed mood and sleep disturbance. The patient is not nervous/anxious.  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   Dyslipidemia 01/13/2007   Essential hypertension 01/13/2007    Past Medical History:  Diagnosis Date   DYSLIPIDEMIA 01/13/2007   no medicines    HYPERTENSION 01/13/2007   HYPOTHYROIDISM, 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    Family History  Problem Relation Age of Onset   Cancer Mother    Heart attack Father    Thyroid cancer Sister    Colon cancer Neg Hx    Colon polyps Neg Hx    Past Surgical History:  Procedure Laterality Date   I-131 therapy  09/1997   maybe 2000-2001   INGUINAL HERNIA REPAIR Right 09/28/2013   Procedure: LAPAROSCOPIC RIGHT  INGUINAL HERNIA;  Surgeon: Axel Filler, MD;  Location: [facility] OR;  Service: General;  Laterality: Right;   INSERTION OF MESH Right 09/28/2013   Procedure: INSERTION OF MESH;  Surgeon: Axel Filler, MD;  Location: [facility] OR;  Service: General;  Laterality: Right;   right knuckle     pin placed in high school   Social History   Social History Narrative   Works Lobbyist History  Administered Date(s) Administered   [employer] Vaccination 07/09/2019, 08/09/2019   Td 06/28/2002   Tdap 04/10/2020     Objective: Vital Signs: BP (!) 149/80 (BP Location: Right Arm, Patient Position: Sitting, Cuff Size: Normal)   Pulse 66   Resp 16   Ht 6' 1.75" (1.873 m)   Wt 280 lb (127 kg)   BMI 36.19 kg/m    Physical Exam Constitutional:      Appearance: He is obese.  Eyes:     Conjunctiva/sclera: Conjunctivae normal.  Cardiovascular:     Rate and Rhythm: Normal rate and regular rhythm.  Pulmonary:     Effort: Pulmonary effort is normal.     Breath sounds: Normal breath sounds.  Lymphadenopathy:     Cervical: No cervical adenopathy.  Skin:    General: Skin is warm and dry.     Findings: Rash present.     Comments:  Extensive skin thickening over both feet and ankles and part way up the shin more on anterior side, nonpitting mildly hypopigmented and without any tenderness or erythema  Neurological:     Mental Status: He is alert.  Psychiatric:        Mood and Affect: Mood normal.      Musculoskeletal Exam:  Shoulders full ROM no tenderness or swelling Elbows full ROM no tenderness or swelling Large right wrist swelling on dorsal side involving both the radial and ulnar sides, no warmth or erythema and no significant tenderness to palpation Fingers full ROM no tenderness or swelling No significant midline or paraspinal tenderness to palpation Mild lateral hip pain provoked with internal rotation Wall to occiput tested since about 5 cm Knees full ROM no tenderness or swelling Ankle range of motion appears grossly intact, difficult to assess swelling with extensive overlying skin changes   Investigation: No additional findings.  Imaging: XR Hand 2 View Right  Result Date: 10/24/2022 X-ray right hand 2 views Slight narrowing of radiocarpal joint and cystic appearing change at joint margin.  Carpal bones appear normal.  Minimal degenerative change of first CMC joint.  There is some widening at metacarpal head most prominent first and third digits.  There is mild DIP joint space narrowing without prominent osteophytes.  Small enthesophytes present on several proximal third middle phalanges.  No erosions seen.  Bone mineralization appears normal. Impression Mild degenerative changes, no acute abnormalities, no erosions or demineralization   Recent Labs: Lab Results  Component Value Date   WBC 9.5 07/03/2022   HGB 14.2 07/03/2022   PLT 250.0 07/03/2022   NA 140 07/03/2022   K 3.1 (L) 07/03/2022   CL 103 07/03/2022   CO2 26 07/03/2022   GLUCOSE 84 07/03/2022   BUN 14 07/03/2022   CREATININE 0.92 07/03/2022   BILITOT 0.7 07/03/2022   ALKPHOS 69 07/03/2022   AST 14 07/03/2022   ALT 13 07/03/2022    PROT 7.7 07/03/2022   ALBUMIN 4.4 07/03/2022   CALCIUM 9.4 07/03/2022   GFRAA >90 09/24/2013    Speciality Comments: No specialty comments available.  Procedures:  No procedures performed Allergies: Simvastatin   Assessment / Plan:     Visit Diagnoses: Pain in right wrist - Plan: XR Hand 2 View Right  Prominent right wrist swelling without significant loss of range of motion on exam today.  Checked an x-ray with slightly more extended wrist healing there is no bony abnormality no evidence of erosion cyst or demineralization in the underlying wrist. Appears more likely overuse related versus inflammatory tenosynovitis.  Chronic bilateral low back pain, unspecified whether sciatica present Sedimentation rate elevation - Plan: Sedimentation rate, C-reactive protein, CBC with Differential/Platelet, COMPLETE METABOLIC PANEL WITH GFR  Complicated picture with some other existing autoimmune process at work there is considerable degenerative change in the spine but could have some overlap.  Will recheck the previously elevated serum inflammatory markers.  Also checking CBC and CMP baseline considering addition of DMARD medication.  Initial impression would be to try addition of sulfasalazine which would have some direct anti-inflammatory effect and currently with more non-axial symptoms.  Orders: Orders Placed This Encounter  Procedures   XR Hand 2 View Right   Sedimentation rate   C-reactive protein   CBC with Differential/Platelet   COMPLETE METABOLIC PANEL WITH GFR   No orders of the defined types were placed in this encounter.    Follow-Up Instructions: Return in about 4 weeks (around 11/21/2022) for New pt ?AS f/u 4wks.   Fuller Plan, MD  Note - This record has been created using [employer].  Chart creation errors have been sought, but may not always  have been located. Such creation errors do not reflect on  the standard of medical care.

## 2022-10-25 LAB — COMPLETE METABOLIC PANEL WITH GFR
AG Ratio: 2.1 (calc) (ref 1.0–2.5)
ALT: 7 U/L — ABNORMAL LOW (ref 9–46)
AST: 9 U/L — ABNORMAL LOW (ref 10–35)
Albumin: 4.5 g/dL (ref 3.6–5.1)
Alkaline phosphatase (APISO): 72 U/L (ref 35–144)
BUN: 13 mg/dL (ref 7–25)
CO2: 24 mmol/L (ref 20–32)
Calcium: 9 mg/dL (ref 8.6–10.3)
Chloride: 107 mmol/L (ref 98–110)
Creat: 0.94 mg/dL (ref 0.70–1.30)
Globulin: 2.1 g/dL (calc) (ref 1.9–3.7)
Glucose, Bld: 94 mg/dL (ref 65–99)
Potassium: 3.5 mmol/L (ref 3.5–5.3)
Sodium: 142 mmol/L (ref 135–146)
Total Bilirubin: 0.6 mg/dL (ref 0.2–1.2)
Total Protein: 6.6 g/dL (ref 6.1–8.1)
eGFR: 93 mL/min/{1.73_m2} (ref >=60)

## 2022-10-25 LAB — CBC WITH DIFFERENTIAL/PLATELET
Absolute Monocytes: 342 cells/uL (ref 200–950)
Basophils Absolute: 51 cells/uL (ref 0–200)
HCT: 41.1 % (ref 38.5–50.0)
Hemoglobin: 13.7 g/dL (ref 13.2–17.1)
Lymphs Abs: 1625 cells/uL (ref 850–3900)
MCH: 27.5 pg (ref 27.0–33.0)
MCHC: 33.3 g/dL (ref 32.0–36.0)
MCV: 82.5 fL (ref 80.0–100.0)
MPV: 9.4 fL (ref 7.5–12.5)
Neutrophils Relative %: 60.9 %
Platelets: 237 10*3/uL (ref 140–400)
RBC: 4.98 10*6/uL (ref 4.20–5.80)
RDW: 13.6 % (ref 11.0–15.0)
WBC: 5.7 10*3/uL (ref 3.8–10.8)

## 2022-10-25 LAB — C-REACTIVE PROTEIN: CRP: <3 mg/L (ref <8.0)

## 2022-11-24 NOTE — Progress Notes (Unsigned)
Office Visit Note  Patient: Alexis Norris             Date of Birth: Jan 03, 1961           MRN: [id number]             PCP: Ardith Dark, MD Referring: Ardith Dark, MD Visit Date: 11/26/2022   Subjective:  No chief complaint on file.   History of Present Illness: Alexis Norris is a 62 y.o. male here for follow up ***   Previous HPI 10/24/22 Alexis Norris is a 62 y.o. male here for evaluation of chronic joint pain in multiple areas associated elevated sedimentation rate and positive HLA-B27.  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  He has a chronic history of some pain and swelling affecting feet and ankles that was previously diagnosed as gout more than 5 years ago.  He was on treatment for this including allopurinol and colchicine but without significant impact on symptoms and if you never had any elevated uric acid blood testing monosodium urate crystals.  This treatment was discontinued a few months ago so far without any major flare.  He has had chronic low back pain for years pretty mild not significantly impacting activities or causing frequent nighttime awakening.  This has gotten a little bit worse over time but in the past few months experienced worsening evaluated sports medicine clinic treated with meloxicam and as needed Flexeril.  He noticed a significant benefit in his joint inflammation when on the meloxicam.  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  Oral NSAIDs and wrist brace quickly improve this but since stopping the meloxicam back to regularly as he has increased swelling again.  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  On review of previous chest x-ray imaging from 2018 also shows pretty  extensive thoracic spine anterior osteophyte processes.  Not currently on any specific medication for joint inflammation has a few minutes of stiffness every morning.  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 Had concern reported for some exophthalmos related to Graves' disease was referred to ophthalmology not yet seen the specialist for this.   No Rheumatology ROS completed.   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   Dyslipidemia 01/13/2007   Essential hypertension 01/13/2007    Past Medical History:  Diagnosis Date   DYSLIPIDEMIA 01/13/2007   no medicines    HYPERTENSION 01/13/2007   HYPOTHYROIDISM, 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    Family History  Problem Relation Age of Onset   Cancer Mother    Heart attack Father    Thyroid cancer Sister    Colon cancer Neg Hx    Colon polyps Neg Hx    Past Surgical History:  Procedure Laterality Date   I-131 therapy  09/1997   maybe 2000-2001   INGUINAL HERNIA REPAIR Right 09/28/2013   Procedure: LAPAROSCOPIC RIGHT  INGUINAL HERNIA;  Surgeon: Axel Filler, MD;  Location: [facility] OR;  Service: General;  Laterality: Right;   INSERTION OF MESH Right 09/28/2013   Procedure: INSERTION OF MESH;  Surgeon: Axel Filler, MD;  Location: [facility] OR;  Service: General;  Laterality: Right;   right knuckle     pin placed in high school   Social History   Social History Narrative   Works Lobbyist History  Administered Date(s) Administered   [employer] Vaccination 07/09/2019, 08/09/2019   Td  06/28/2002   Tdap 04/10/2020     Objective: Vital Signs: There were no vitals taken for this visit.   Physical Exam   Musculoskeletal Exam: ***  CDAI Exam: CDAI Score: -- Patient Global: --; Provider Global: -- Swollen: --; Tender: -- Joint Exam 11/26/2022   No joint exam has been documented for this visit   There is currently no information documented on the homunculus. Go to the Rheumatology activity and complete the homunculus joint exam.  Investigation: No additional findings.  Imaging: No results found.  Recent Labs: Lab Results  Component Value Date   WBC 5.7 10/24/2022   HGB 13.7 10/24/2022   PLT 237 10/24/2022   NA 142 10/24/2022   K 3.5 10/24/2022   CL 107 10/24/2022   CO2 24 10/24/2022   GLUCOSE 94 10/24/2022   BUN 13 10/24/2022   CREATININE 0.94 10/24/2022   BILITOT 0.6 10/24/2022   ALKPHOS 69 07/03/2022   AST 9 (L) 10/24/2022   ALT 7 (L) 10/24/2022   PROT 6.6 10/24/2022   ALBUMIN 4.4 07/03/2022   CALCIUM 9.0 10/24/2022   GFRAA >90 09/24/2013    Speciality Comments: No specialty comments available.  Procedures:  No procedures performed Allergies: Simvastatin   Assessment / Plan:     Visit Diagnoses: No diagnosis found.  ***  Orders: No orders of the defined types were placed in this encounter.  No orders of the defined types were placed in this encounter.    Follow-Up Instructions: No follow-ups on file.   Fuller Plan, MD  Note - This record has been created using [employer].  Chart creation errors have been sought, but may not always  have been located. Such creation errors do not reflect on  the standard of medical care.

## 2022-11-25 ENCOUNTER — Encounter: Payer: Self-pay | Admitting: Gastroenterology

## 2022-11-25 ENCOUNTER — Ambulatory Visit: Payer: 59 | Admitting: Gastroenterology

## 2022-11-25 VITALS — BP 148/92 | HR 86 | Ht 74.0 in | Wt 281.0 lb

## 2022-11-25 DIAGNOSIS — R194 Change in bowel habit: Secondary | ICD-10-CM

## 2022-11-25 DIAGNOSIS — K625 Hemorrhage of anus and rectum: Secondary | ICD-10-CM

## 2022-11-25 MED ORDER — HYDROCORTISONE (PERIANAL) 2.5 % EX CREA: 1.0000 | TOPICAL_CREAM | Freq: Two times a day (BID) | CUTANEOUS | 1 refills | Status: DC

## 2022-11-25 NOTE — Progress Notes (Signed)
Chief Complaint: Rectal bleeding Primary GI MD: Dr. Myrtie Neither  HPI: 62 year old male, who was referred to me by Ardith Dark, MD for a complaint of rectal bleeding.    Patient states over the last few months he has noticed a change in his bowel habits and more frequent occurrence of rectal bleeding.  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  States often times if he eats too many peanuts or eats certain foods he will notice he has bouts of rectal bleeding.  Bright red mainly on the tissue paper.  States that is typically a minimal amount.  He also states he has had multiple medication changes over the last few months in regards to his gout and has noticed he has become more constipated and having to strain with bowel movements.  States he no longer has "logs" but has pebbled pieces that he has to push for.  Denies rectal pain.  Denies abdominal pain.  Denies nausea/vomiting.   PREVIOUS GI WORKUP   Colonoscopy 08/31/2015 for screening colonoscopy: Diverticulosis in ascending colon and left colon.  No specimens collected.  Otherwise normal.  Repeat 10 years.  Past Medical History:  Diagnosis Date   DYSLIPIDEMIA 01/13/2007   no medicines    HYPERTENSION 01/13/2007   HYPOTHYROIDISM, 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    Past Surgical History:  Procedure Laterality Date   I-131 therapy  09/1997   maybe 2000-2001   INGUINAL HERNIA REPAIR Right 09/28/2013   Procedure: LAPAROSCOPIC RIGHT  INGUINAL HERNIA;  Surgeon: Axel Filler, MD;  Location: [facility] OR;  Service: General;  Laterality: Right;   INSERTION OF MESH Right 09/28/2013   Procedure: INSERTION OF MESH;  Surgeon: Axel Filler, MD;  Location: [facility] OR;  Service: General;  Laterality: Right;   right knuckle     pin placed in high school    Current Outpatient Medications  Medication Sig Dispense Refill   Colchicine 0.6 MG CAPS Day 1: Take 2 caps, then 1 cap an hour later. Day 2 and  beyond: 1 cap daily (Patient not taking: Reported on 10/24/2022) 30 capsule 0   cyclobenzaprine (FLEXERIL) 5 MG tablet Take 1 tablet (5 mg total) by mouth at bedtime. (Patient not taking: Reported on 10/24/2022) 30 tablet 0   levothyroxine (SYNTHROID) 125 MCG tablet TAKE 2 TABLETS DAILY BEFOREBREAKFAST (Patient not taking: Reported on 10/24/2022) 180 tablet 0   losartan (COZAAR) 100 MG tablet TAKE 1 TABLET DAILY. 100 tablet 3   meloxicam (MOBIC) 15 MG tablet Take 1 tablet (15 mg total) by mouth daily. 30 tablet 0   meloxicam (MOBIC) 15 MG tablet Take 1 tablet (15 mg total) by mouth daily. (Patient not taking: Reported on 10/24/2022) 30 tablet 0   predniSONE (DELTASONE) 5 MG tablet Take 5 mg by mouth daily. (Patient not taking: Reported on 10/24/2022)     SYNTHROID 112 MCG tablet Take 2 tablets a day on an empty stomach in the morning with water     tadalafil (CIALIS) 5 MG tablet Take 1 tablet (5 mg total) by mouth daily. 90 tablet 3   No current facility-administered medications for this visit.    Allergies as of 11/25/2022 - Review Complete 10/24/2022  Allergen Reaction Noted   Simvastatin Nausea Only 04/05/2008    Family History  Problem Relation Age of Onset   Cancer Mother    Heart attack Father    Thyroid cancer Sister    Colon cancer  Neg Hx    Colon polyps Neg Hx     Social History   Socioeconomic History   Marital status: Married    Spouse name: Not on file   Number of children: Not on file   Years of education: Not on file   Highest education level: Not on file  Occupational History   Occupation: Paramedic  Tobacco Use   Smoking status: Never    Passive exposure: Past   Smokeless tobacco: Never  Vaping Use   Vaping status: Never Used  Substance and Sexual Activity   Alcohol use: Yes    Comment: infrequently   Drug use: No   Sexual activity: Not on file  Other Topics Concern   Not on file  Social History Narrative   Works Forensic scientist   Social Determinants of  Corporate investment banker Strain: Not on file  Food Insecurity: Not on file  Transportation Needs: Not on file  Physical Activity: Not on file  Stress: Not on file  Social Connections: Not on file  Intimate Partner Violence: Not on file    Review of Systems:    Constitutional: No weight loss, fever, chills, weakness or fatigue HEENT: Eyes: No change in vision               Ears, Nose, Throat:  No change in hearing or congestion Skin: No rash or itching Cardiovascular: No chest pain, chest pressure or palpitations   Respiratory: No SOB or cough Gastrointestinal: See HPI and otherwise negative Genitourinary: No dysuria or change in urinary frequency Neurological: No headache, dizziness or syncope Musculoskeletal: No new muscle or joint pain Hematologic: No bleeding or bruising Psychiatric: No history of depression or anxiety    Physical Exam:  Vital signs: There were no vitals taken for this visit.  Constitutional: NAD, Well developed, Well nourished, alert and cooperative Head:  Normocephalic and atraumatic. Eyes:   PEERL, EOMI. No icterus. Conjunctiva pink. Respiratory: Respirations even and unlabored. Lungs clear to auscultation bilaterally.   No wheezes, crackles, or rhonchi.  Cardiovascular:  Regular rate and rhythm. No peripheral edema, cyanosis or pallor.  Gastrointestinal:  Soft, nondistended, nontender. No rebound or guarding. Normal bowel sounds. No appreciable masses or hepatomegaly. Rectal:  Not performed.  Msk:  Symmetrical without gross deformities. Without edema, no deformity or joint abnormality.  Neurologic:  Alert and  oriented x4;  grossly normal neurologically.  Skin:   Dry and intact without significant lesions or rashes. Psychiatric: Oriented to person, place and time. Demonstrates good judgement and reason without abnormal affect or behaviors.   RELEVANT LABS AND IMAGING: CBC    Component Value Date/Time   WBC 5.7 10/24/2022 1000   RBC 4.98  10/24/2022 1000   HGB 13.7 10/24/2022 1000   HCT 41.1 10/24/2022 1000   PLT 237 10/24/2022 1000   MCV 82.5 10/24/2022 1000   MCH 27.5 10/24/2022 1000   MCHC 33.3 10/24/2022 1000   RDW 13.6 10/24/2022 1000   LYMPHSABS 1,625 10/24/2022 1000   MONOABS 0.6 07/03/2022 1130   EOSABS 211 10/24/2022 1000   BASOSABS 51 10/24/2022 1000    CMP     Component Value Date/Time   NA 142 10/24/2022 1000   K 3.5 10/24/2022 1000   CL 107 10/24/2022 1000   CO2 24 10/24/2022 1000   GLUCOSE 94 10/24/2022 1000   BUN 13 10/24/2022 1000   CREATININE 0.94 10/24/2022 1000   CALCIUM 9.0 10/24/2022 1000   PROT 6.6 10/24/2022 1000  ALBUMIN 4.4 07/03/2022 1130   AST 9 (L) 10/24/2022 1000   ALT 7 (L) 10/24/2022 1000   ALKPHOS 69 07/03/2022 1130   BILITOT 0.6 10/24/2022 1000   GFRNONAA >90 09/24/2013 1441   GFRAA >90 09/24/2013 1441     Assessment/Plan:   Rectal bleeding Rectal bleeding mainly on tissue paper, minimal amount, bright red, with worsening constipation.  Suspect rectal bleeding is hemorrhoidal.  However, he is 7 years out from his most recent colonoscopy so I think it is reasonable to get a colonoscopy early with the presence of change in bowel habits and rectal bleeding together.  Patient also agrees and would like a colonoscopy for further evaluation -Colonoscopy for further evaluation -Hydrocortisone perianal cream twice daily for 14 days -Discussion about prevention of constipation in order to prevent worsening of hemorrhoids -Squatty potty -increase water, increase fiber, increase exercise   Change in bowel habits Constipation with ongoing medication changes.  Likely due to his medication changes.  But with acute change in bowel habits and presence of rectal bleeding I think a colonoscopy is reasonable since his last one was 7 years ago. -Colonoscopy for further evaluation -Benefiber daily, increase water, increase exercise -Start taking Miralax 1 capful (17 grams) 1x / day for 1  week.   If this is not effective, increase to 1 dose 2x / day for 1 week.   If this is still not effective, increase to two capfuls (34 grams) 2x / day.   Can adjust dose as needed based on response. Can take 1/2 cap daily, skip days, or increase per day.     Lara Mulch [facility] 11/25/2022, 10:39 AM  Cc: Ardith Dark, MD

## 2022-11-25 NOTE — Patient Instructions (Addendum)
You have been scheduled for a colonoscopy. Please follow written instructions given to you at your visit today.   Please pick up your prep supplies at the pharmacy within the next 1-3 days.  If you use inhalers (even only as needed), please bring them with you on the day of your procedure.  DO NOT TAKE 7 DAYS PRIOR TO TEST- Trulicity (dulaglutide) Ozempic, Wegovy (semaglutide) Mounjaro (tirzepatide) Bydureon Bcise (exanatide extended release)  DO NOT TAKE 1 DAY PRIOR TO YOUR TEST Rybelsus (semaglutide) Adlyxin (lixisenatide) Victoza (liraglutide) Byetta (exanatide) ___________________________________________________________________________   Use a Fiber Supplement daily  Take Miralax 1 capful daily with 8 oz juice or water  You can use a Squatty Potty to help with Constipation, this can be purchased at any local retail Pharmacy  We have sent the following medications to your pharmacy for you to pick up at your convenience: Hydrocortisone cream  _______________________________________________________  If your blood pressure at your visit was 140/90 or greater, please contact your primary care physician to follow up on this.  _______________________________________________________  If you are age 60 or older, your body mass index should be between 23-30. Your Body mass index is 36.08 kg/m. If this is out of the aforementioned range listed, please consider follow up with your Primary Care Provider.  If you are age 94 or younger, your body mass index should be between 19-25. Your Body mass index is 36.08 kg/m. If this is out of the aformentioned range listed, please consider follow up with your Primary Care Provider.   ________________________________________________________  The [facility] providers would like to encourage you to use [facility] to communicate with providers for non-urgent requests or questions.  Due to long hold times on the telephone, sending your provider a  message by [facility] may be a faster and more efficient way to get a response.  Please allow 48 business hours for a response.  Please remember that this is for non-urgent requests.  _______________________________________________________   Due to recent changes in healthcare laws, you may see the results of your imaging and laboratory studies on MyChart before your provider has had a chance to review them.  We understand that in some cases there may be results that are confusing or concerning to you. Not all laboratory results come back in the same time frame and the provider may be waiting for multiple results in order to interpret others.  Please give [place] 48 hours in order for your provider to thoroughly review all the results before contacting the office for clarification of your results.    I appreciate the  opportunity to care for you  Thank You   [facility]

## 2022-11-26 ENCOUNTER — Ambulatory Visit: Payer: 59 | Attending: Internal Medicine | Admitting: Internal Medicine

## 2022-11-26 ENCOUNTER — Encounter: Payer: Self-pay | Admitting: Internal Medicine

## 2022-11-26 VITALS — BP 145/79 | HR 75 | Resp 14 | Ht 74.0 in | Wt 282.0 lb

## 2022-11-26 DIAGNOSIS — M1380 Other specified arthritis, unspecified site: Secondary | ICD-10-CM | POA: Diagnosis not present

## 2022-11-26 DIAGNOSIS — M459 Ankylosing spondylitis of unspecified sites in spine: Secondary | ICD-10-CM | POA: Insufficient documentation

## 2022-11-26 MED ORDER — SULFASALAZINE 500 MG PO TABS
1000.0000 mg | ORAL_TABLET | Freq: Two times a day (BID) | ORAL | 0 refills | Status: DC
Start: 2022-11-26 — End: 2022-12-31

## 2022-11-26 NOTE — Patient Instructions (Signed)
Sulfasalazine Tablets What is this medication? SULFASALAZINE (sul fa SAL a zeen) treats ulcerative colitis. It works by decreasing inflammation. It belongs to a group of medications called salicylates. This medicine may be used for other purposes; ask your health care provider or pharmacist if you have questions. COMMON BRAND NAME(S): Azulfidine, Sulfazine What should I tell my care team before I take this medication? They need to know if you have any of these conditions: Asthma Blood disorders or anemia Glucose-6-phosphate dehydrogenase (G6PD) deficiency Intestinal obstruction Kidney disease Liver disease Porphyria Urinary tract obstruction An unusual reaction to sulfasalazine, sulfa medications, salicylates, or other medications, foods, dyes, or preservatives Pregnant or trying to get pregnant Breast-feeding How should I use this medication? Take this medication by mouth with a full glass of water. Take it as directed on the prescription label at the same time every day. You can take it with or without food. If it upsets your stomach, take it with food. Keep taking it unless your care team tells you to stop. Talk to your care team about the use of this medication in children. While this medication may be prescribed for children as young as 6 years for selected conditions, precautions do apply. Patients over 36 years old may have a stronger reaction and need a smaller dose. Overdosage: If you think you have taken too much of this medicine contact a poison control center or emergency room at once. NOTE: This medicine is only for you. Do not share this medicine with others. What if I miss a dose? If you miss a dose, take it as soon as you can. If it is almost time for your next dose, take only that dose. Do not take double or extra doses. What may interact with this medication? Digoxin Folic acid This list may not describe all possible interactions. Give your health care provider a list  of all the medicines, herbs, non-prescription drugs, or dietary supplements you use. Also tell them if you smoke, drink alcohol, or use illegal drugs. Some items may interact with your medicine. What should I watch for while using this medication? Visit your care team for regular checks on your progress. Tell your care team if your symptoms do not start to get better or if they get worse. You will need frequent blood and urine checks. This medication can make you more sensitive to the sun. Keep out of the sun. If you cannot avoid being in the sun, wear protective clothing and use sunscreen. Do not use sun lamps or tanning beds/booths. Drink plenty of water while taking this medication. What side effects may I notice from receiving this medication? Side effects that you should report to your care team as soon as possible: Allergic reactions--skin rash, itching, hives, swelling of the face, lips, tongue, or throat Aplastic anemia--unusual weakness or fatigue, dizziness, headache, trouble breathing, increased bleeding or bruising Dry cough, shortness of breath or trouble breathing Heart muscle inflammation--unusual weakness or fatigue, shortness of breath, chest pain, fast or irregular heartbeat, dizziness, swelling of the ankles, feet, or hands Infection--fever, chills, cough, sore throat, wounds that don't heal, pain or trouble when passing urine, general feeling of discomfort or being unwell Kidney injury--decrease in the amount of urine, swelling of the ankles, hands, or feet Liver injury--right upper belly pain, loss of appetite, nausea, light-colored stool, dark yellow or brown urine, yellowing skin or eyes, unusual weakness or fatigue Rash, fever, and swollen lymph nodes Redness, blistering, peeling, or loosening of the skin, including inside  the mouth Side effects that usually do not require medical attention (report to your care team if they continue or are bothersome): Dark yellow or orange  saliva, sweat, or urine Dizziness Headache Loss of appetite Nausea Upset stomach Vomiting This list may not describe all possible side effects. Call your doctor for medical advice about side effects. You may report side effects to FDA at 1-800-FDA-1088. Where should I keep my medication? Keep out of the reach of children and pets. Store at room temperature between 15 and 30 degrees C (59 and 86 degrees F). Get rid of any unused medication after the expiration date. To get rid of medications that are no longer needed or have expired: Take the medications to a medication take-back program. Check with your pharmacy or law enforcement to find a location. If you cannot return the medication, check the label or package insert to see if the medication should be thrown out in the garbage or flushed down the toilet. If you are not sure, ask your care team. If it is safe to put it in the trash, take the medication out of the container. Mix the medication with cat litter, dirt, coffee grounds, or other unwanted substance. Seal the mixture in a bag or container. Put it in the trash. NOTE: This sheet is a summary. It may not cover all possible information. If you have questions about this medicine, talk to your doctor, pharmacist, or health care provider.  2024 Elsevier/Gold Standard (2021-03-08 00:00:00)

## 2022-12-04 NOTE — Progress Notes (Signed)
____________________________________________________________  Attending physician addendum:  Thank you for sending this case to me. I have reviewed the entire note and agree with the plan.  Agree this sounds likely hemorrhoidal.  If discovered on colonoscopy and no other significant findings, I will discuss the option of banding with him.  Amada Jupiter, MD  ____________________________________________________________

## 2022-12-11 ENCOUNTER — Other Ambulatory Visit: Payer: Self-pay | Admitting: Family Medicine

## 2022-12-18 NOTE — Progress Notes (Unsigned)
Office Visit Note  Patient: Alexis Norris             Date of Birth: 1960-06-15           MRN: [id number]             PCP: Ardith Dark, MD Referring: Ardith Dark, MD Visit Date: 01/01/2023   Subjective:  Follow-up (Patient states he has some aching in his hips. )   History of Present Illness: Alexis Norris is a 62 y.o. 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  He started taking 1 tablet twice daily for a week after last visit then increased it to 2 tablets twice daily.  He noticed improvement in his right wrist pain and stiffness as well as back pain.  Still has a few minutes of stiffness waking up every morning.  He has noticed some new achiness affecting both hips feels this is most similar to the soreness after exercising but is not associated with any change in activity.  Also having some pain and stiffness at the right side of his neck with a little more trouble looking side-to-side and feels a nontender nodule in the area.  Previous HPI 11/26/2022 Alexis Norris is a 62 y.o. male here for follow up for inflammatory arthritis concern for possible ankylosing spondylitis versus seronegative inflammatory arthritis with previous history of positive HLA-B27 and Graves' disease and elevated sedimentation rate.  Exam at our initial visit was consistent with active tenosynovitis of the right wrist.  X-ray negative for any local erosions or demineralization.  Lab test showed mild persistent elevated sed rate positive at 24 but this is much improved compared to previous levels.  Since then his wrist is doing slightly better but remains swollen.  He is noticing low back pain and stiffness for a few minutes each morning.  Also thinks he has some increase in the pain and stiffness usually notices getting out of his truck.  No change in the chronic lower leg swelling on both sides.  He was  scheduled for updated colonoscopy in October due to scant bright red blood per rectum.   Previous HPI 10/24/22 Alexis Norris is a 62 y.o. male here for evaluation of chronic joint pain in multiple areas associated elevated sedimentation rate and positive HLA-B27.  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  He has a chronic history of some pain and swelling affecting feet and ankles that was previously diagnosed as gout more than 5 years ago.  He was on treatment for this including allopurinol and colchicine but without significant impact on symptoms and if you never had any elevated uric acid blood testing monosodium urate crystals.  This treatment was discontinued a few months ago so far without any major flare.  He has had chronic low back pain for years pretty mild not significantly impacting activities or causing frequent nighttime awakening.  This has gotten a little bit worse over time but in the past few months experienced worsening evaluated sports medicine clinic treated with meloxicam and as needed Flexeril.  He noticed a significant benefit in his joint inflammation when on the meloxicam.  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  Oral NSAIDs and wrist brace quickly improve this but since stopping the meloxicam back to  regularly as he has increased swelling again.  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  On review of previous chest x-ray imaging from 2018 also shows pretty extensive thoracic spine anterior osteophyte processes.  Not currently on any specific medication for joint inflammation has a few minutes of stiffness every morning.  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 Had concern reported for some exophthalmos related to Graves' disease was referred to ophthalmology not yet seen the specialist for this.   Review of Systems  Constitutional:  Positive for fatigue.  HENT:  Negative for mouth sores and mouth dryness.   Eyes:  Negative for dryness.  Respiratory:  Negative for shortness of breath.   Cardiovascular:  Negative for chest pain and palpitations.  Gastrointestinal:  Positive for blood in stool and constipation. Negative for diarrhea.  Endocrine: Negative for increased urination.  Genitourinary:  Negative for involuntary urination.  Musculoskeletal:  Positive for joint pain, joint pain, myalgias, morning stiffness and myalgias. Negative for gait problem, joint swelling, muscle weakness and muscle tenderness.  Skin:  Negative for color change, rash, hair loss and sensitivity to sunlight.  Allergic/Immunologic: Negative for susceptible to infections.  Neurological:  Negative for dizziness and headaches.  Hematological:  Negative for swollen glands.  Psychiatric/Behavioral:  Negative for depressed mood and sleep disturbance. The patient is not nervous/anxious.     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   Dyslipidemia 01/13/2007   Essential hypertension 01/13/2007    Past Medical History:  Diagnosis Date   DYSLIPIDEMIA 01/13/2007   no medicines    HYPERTENSION 01/13/2007   HYPOTHYROIDISM, 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   Lumbar back pain    Wrist clicking     Family History  Problem Relation Age of Onset   Cancer Mother     Heart attack Father    Thyroid cancer Sister    Colon cancer Neg Hx    Liver disease Neg Hx    Esophageal cancer Neg Hx    Past Surgical History:  Procedure Laterality Date   I-131 therapy  09/1997   maybe 2000-2001   INGUINAL HERNIA REPAIR Right 09/28/2013   Procedure: LAPAROSCOPIC RIGHT  INGUINAL HERNIA;  Surgeon: Axel Filler, MD;  Location: [facility] OR;  Service: General;  Laterality: Right;   INSERTION OF MESH Right 09/28/2013   Procedure: INSERTION OF MESH;  Surgeon: Axel Filler, MD;  Location: [facility] OR;  Service: General;  Laterality: Right;   right knuckle     pin placed in high school   Social History   Social History Narrative   Works Lobbyist History  Administered Date(s) Administered   [employer] Vaccination 07/09/2019, 08/09/2019   Td 06/28/2002   Tdap 04/10/2020     Objective: Vital Signs: BP (!) 144/81 (BP Location: Left Arm, Patient Position: Sitting, Cuff Size: Normal)   Pulse 92   Resp 14   Ht 6\' 1"  (1.854 m)   Wt 283 lb (128.4 kg)   BMI 37.34 kg/m    Physical Exam   Neck nodule  right side over muscle ?lipoma or cyst [facility]     Comments: Chronic skin changes with thickening and hyperpigmentation on distal legs more anterior than posterior, no focal lesions no erythema  ***  Musculoskeletal Exam:   Elbows full ROM no tenderness or swelling Nontender right wrist swelling on the dorsal side, range of motion appears preserved no warmth or erythema Fingers full ROM no tenderness or swelling No significant midline or paraspinal tenderness to palpation Hips mildly restricted with internal rotation and provokes mild hip pain Knees full ROM no tenderness or swelling  Investigation: No additional findings.  Imaging: No results found.  Recent Labs: Lab Results  Component Value Date   WBC 5.7 10/24/2022   HGB 13.7 10/24/2022   PLT 237 10/24/2022   NA 142 10/24/2022   K 3.5 10/24/2022   CL 107 10/24/2022   CO2  24 10/24/2022   GLUCOSE 94 10/24/2022   BUN 13 10/24/2022   CREATININE 0.94 10/24/2022   BILITOT 0.6 10/24/2022   ALKPHOS 69 07/03/2022   AST 9 (L) 10/24/2022   ALT 7 (L) 10/24/2022   PROT 6.6 10/24/2022   ALBUMIN 4.4 07/03/2022   CALCIUM 9.0 10/24/2022   GFRAA >90 09/24/2013    Speciality Comments: No specialty comments available.  Procedures:  No procedures performed Allergies: Simvastatin   Assessment / Plan:     Visit Diagnoses: Seronegative inflammatory arthritis  High risk medication use - Plan to start sulfasalazine titrate to 1000 mg twice daily.  ***  Orders: No orders of the defined types were placed in this encounter.  No orders of the defined types were placed in this encounter.    Follow-Up Instructions: No follow-ups on file.   Fuller Plan, MD  Note - This record has been created using [employer].  Chart creation errors have been sought, but may not always  have been located. Such creation errors do not reflect on  the standard of medical care.

## 2022-12-31 ENCOUNTER — Other Ambulatory Visit: Payer: Self-pay

## 2022-12-31 DIAGNOSIS — M1380 Other specified arthritis, unspecified site: Secondary | ICD-10-CM

## 2022-12-31 NOTE — Telephone Encounter (Signed)
[facility]   Last Fill: 11/26/2022  Labs: 10/24/2022  AST 9 ALT 7  Next Visit: 01/01/2023  Last Visit: 11/26/2022  DX: Seronegative inflammatory arthritis   Current Dose per office note 11/26/2022: Plan to start sulfasalazine titrate to 1000 mg twice daily.   Okay to refill Sulfasalazine?

## 2023-01-01 ENCOUNTER — Encounter: Payer: Self-pay | Admitting: Internal Medicine

## 2023-01-01 ENCOUNTER — Ambulatory Visit: Payer: 59 | Attending: Internal Medicine | Admitting: Internal Medicine

## 2023-01-01 VITALS — BP 151/85 | HR 87 | Resp 14 | Ht 73.0 in | Wt 283.0 lb

## 2023-01-01 DIAGNOSIS — Z79899 Other long term (current) drug therapy: Secondary | ICD-10-CM

## 2023-01-01 DIAGNOSIS — M25552 Pain in left hip: Secondary | ICD-10-CM | POA: Diagnosis not present

## 2023-01-01 DIAGNOSIS — M1380 Other specified arthritis, unspecified site: Secondary | ICD-10-CM | POA: Diagnosis not present

## 2023-01-01 DIAGNOSIS — M25551 Pain in right hip: Secondary | ICD-10-CM | POA: Diagnosis not present

## 2023-01-02 LAB — CK: Total CK: 34 U/L — ABNORMAL LOW (ref 44–196)

## 2023-01-02 LAB — COMPLETE METABOLIC PANEL WITH GFR
AG Ratio: 1.4 (calc) (ref 1.0–2.5)
ALT: 14 U/L (ref 9–46)
AST: 15 U/L (ref 10–35)
Albumin: 4.1 g/dL (ref 3.6–5.1)
Alkaline phosphatase (APISO): 82 U/L (ref 35–144)
BUN: 10 mg/dL (ref 7–25)
CO2: 24 mmol/L (ref 20–32)
Calcium: 8.7 mg/dL (ref 8.6–10.3)
Chloride: 103 mmol/L (ref 98–110)
Creat: 1.11 mg/dL (ref 0.70–1.30)
Globulin: 2.9 g/dL (ref 1.9–3.7)
Glucose, Bld: 86 mg/dL (ref 65–99)
Potassium: 3.5 mmol/L (ref 3.5–5.3)
Sodium: 139 mmol/L (ref 135–146)
Total Bilirubin: 0.4 mg/dL (ref 0.2–1.2)
Total Protein: 7 g/dL (ref 6.1–8.1)
eGFR: 76 mL/min/{1.73_m2} (ref >=60)

## 2023-01-02 LAB — CBC WITH DIFFERENTIAL/PLATELET
Absolute Monocytes: 418 {cells}/uL (ref 200–950)
Basophils Absolute: 102 {cells}/uL (ref 0–200)
Basophils Relative: 2 %
Eosinophils Absolute: 627 {cells}/uL — ABNORMAL HIGH (ref 15–500)
Eosinophils Relative: 12.3 %
HCT: 39.1 % (ref 38.5–50.0)
Hemoglobin: 12.7 g/dL — ABNORMAL LOW (ref 13.2–17.1)
Lymphs Abs: 1168 {cells}/uL (ref 850–3900)
MCH: 26 pg — ABNORMAL LOW (ref 27.0–33.0)
MCHC: 32.5 g/dL (ref 32.0–36.0)
MCV: 80 fL (ref 80.0–100.0)
MPV: 9.5 fL (ref 7.5–12.5)
Monocytes Relative: 8.2 %
Neutro Abs: 2785 {cells}/uL (ref 1500–7800)
Neutrophils Relative %: 54.6 %
Platelets: 194 10*3/uL (ref 140–400)
RBC: 4.89 10*6/uL (ref 4.20–5.80)
RDW: 14.2 % (ref 11.0–15.0)
Total Lymphocyte: 22.9 %
WBC: 5.1 10*3/uL (ref 3.8–10.8)

## 2023-01-02 LAB — SEDIMENTATION RATE: Sed Rate: 51 mm/h — ABNORMAL HIGH (ref 0–20)

## 2023-01-02 MED ORDER — SULFASALAZINE 500 MG PO TABS
1000.0000 mg | ORAL_TABLET | Freq: Two times a day (BID) | ORAL | 0 refills | Status: DC
Start: 2023-01-02 — End: 2023-08-25

## 2023-01-02 NOTE — Progress Notes (Signed)
CK is 36 no evidence of muscle damage or inflammation related to the hip soreness. His sedimentation rate remains high at 51. His blood count shows an increased number of eosinophils, these are immune cells usually more related to allergy type reactions. I think he is okay to continue the current medication for now. We can see how symptoms do over the next few months but if symptoms and lab results do not get any better might consider trying something different.

## 2023-01-11 ENCOUNTER — Other Ambulatory Visit: Payer: Self-pay | Admitting: Family Medicine

## 2023-01-17 ENCOUNTER — Encounter: Payer: Self-pay | Admitting: Gastroenterology

## 2023-01-29 ENCOUNTER — Telehealth: Payer: Self-pay | Admitting: Gastroenterology

## 2023-01-29 MED ORDER — NA SULFATE-K SULFATE-MG SULF 17.5-3.13-1.6 GM/177ML PO SOLN: 1.0000 | Freq: Once | ORAL | 0 refills | Status: AC

## 2023-01-29 NOTE — Telephone Encounter (Signed)
Inbound call from patient, stating prep medication was never sent to pharmacy for his procedure scheduled for 10/4. Would like it sent to [facility] [address].

## 2023-01-29 NOTE — Telephone Encounter (Signed)
Called patient and informed him that I sent his prep to [facility] on [address] and for him to go ahead and try to pick it uo today to make sure it is no problem with his insurance or its not out of stock.

## 2023-01-31 ENCOUNTER — Encounter: Payer: Self-pay | Admitting: Gastroenterology

## 2023-01-31 ENCOUNTER — Ambulatory Visit (AMBULATORY_SURGERY_CENTER): Payer: 59 | Admitting: Gastroenterology

## 2023-01-31 VITALS — BP 136/75 | HR 71 | Temp 98.9°F | Resp 12 | Ht 74.0 in | Wt 281.0 lb

## 2023-01-31 DIAGNOSIS — D123 Benign neoplasm of transverse colon: Secondary | ICD-10-CM

## 2023-01-31 DIAGNOSIS — K625 Hemorrhage of anus and rectum: Secondary | ICD-10-CM | POA: Diagnosis not present

## 2023-01-31 DIAGNOSIS — K635 Polyp of colon: Secondary | ICD-10-CM | POA: Diagnosis not present

## 2023-01-31 MED ORDER — SODIUM CHLORIDE 0.9 % IV SOLN: 500.0000 mL | Freq: Once | INTRAVENOUS | Status: AC

## 2023-01-31 NOTE — Progress Notes (Signed)
PT taken to PACU. Monitors in place. VSS. Report given to RN. 

## 2023-01-31 NOTE — Patient Instructions (Signed)
   Handouts on polyps,diverticulosis,& hemorrhoids given to you today.   Await pathology results on polyps removed    Use Preparation H suppository (over the counter) Insert rectally as necessary  YOU HAD AN ENDOSCOPIC PROCEDURE TODAY AT THE [facility]:   Refer to the procedure report that was given to you for any specific questions about what was found during the examination.  If the procedure report does not answer your questions, please call your gastroenterologist to clarify.  If you requested that your care partner not be given the details of your procedure findings, then the procedure report has been included in a sealed envelope for you to review at your convenience later.  YOU SHOULD EXPECT: Some feelings of bloating in the abdomen. Passage of more gas than usual.  Walking can help get rid of the air that was put into your GI tract during the procedure and reduce the bloating. If you had a lower endoscopy (such as a colonoscopy or flexible sigmoidoscopy) you may notice spotting of blood in your stool or on the toilet paper. If you underwent a bowel prep for your procedure, you may not have a normal bowel movement for a few days.  Please Note:  You might notice some irritation and congestion in your nose or some drainage.  This is from the oxygen used during your procedure.  There is no need for concern and it should clear up in a day or so.  SYMPTOMS TO REPORT IMMEDIATELY:  Following lower endoscopy (colonoscopy or flexible sigmoidoscopy):  Excessive amounts of blood in the stool  Significant tenderness or worsening of abdominal pains  Swelling of the abdomen that is new, acute  Fever of 100F or higher   For urgent or emergent issues, a gastroenterologist can be reached at any hour by calling [phone]. Do not use MyChart messaging for urgent concerns.    DIET:  We do recommend a small meal at first, but then you may proceed to your regular diet.  Drink plenty  of fluids but you should avoid alcoholic beverages for 24 hours.  ACTIVITY:  You should plan to take it easy for the rest of today and you should NOT DRIVE or use heavy machinery until tomorrow (because of the sedation medicines used during the test).    FOLLOW UP: Our staff will call the number listed on your records the next business day following your procedure.  We will call around 7:15- 8:00 am to check on you and address any questions or concerns that you may have regarding the information given to you following your procedure. If we do not reach you, we will leave a message.     If any biopsies were taken you will be contacted by phone or by letter within the next 1-3 weeks.  Please call us at [phone] if you have not heard about the biopsies in 3 weeks.    SIGNATURES/CONFIDENTIALITY: You and/or your care partner have signed paperwork which will be entered into your electronic medical record.  These signatures attest to the fact that that the information above on your After Visit Summary has been reviewed and is understood.  Full responsibility of the confidentiality of this discharge information lies with you and/or your care-partner.

## 2023-01-31 NOTE — Progress Notes (Signed)
Called to room to assist during endoscopic procedure.  Patient ID and intended procedure confirmed with present staff. Received instructions for my participation in the procedure from the performing physician.  

## 2023-01-31 NOTE — Op Note (Signed)
[facility] Patient Name: Alexis Norris Procedure Date: 01/31/2023 11:35 AM MRN: [id number] Endoscopist: Sherilyn Cooter L. Myrtie Neither , MD, [id number] Age: 62 Referring MD:  Date of Birth: 1960/08/31 Gender: Male Account #: [id number] Procedure:                Colonoscopy Indications:              Rectal bleeding (episodic, seems to be with certain                            food triggers) Medicines:                Monitored Anesthesia Care Procedure:                Pre-Anesthesia Assessment:                           - Prior to the procedure, a History and Physical                            was performed, and patient medications and                            allergies were reviewed. The patient's tolerance of                            previous anesthesia was also reviewed. The risks                            and benefits of the procedure and the sedation                            options and risks were discussed with the patient.                            All questions were answered, and informed consent                            was obtained. Prior Anticoagulants: The patient has                            taken no anticoagulant or antiplatelet agents. ASA                            Grade Assessment: II - A patient with mild systemic                            disease. After reviewing the risks and benefits,                            the patient was deemed in satisfactory condition to                            undergo the procedure.  After obtaining informed consent, the colonoscope                            was passed under direct vision. Throughout the                            procedure, the patient's blood pressure, pulse, and                            oxygen saturations were monitored continuously. The                            CF [id number] #[id number] was introduced through the anus                            and advanced to the the cecum,  identified by                            appendiceal orifice and ileocecal valve. The                            colonoscopy was performed without difficulty. The                            patient tolerated the procedure well. The quality                            of the bowel preparation was excellent. The                            ileocecal valve, appendiceal orifice, and rectum                            were photographed. Scope In: 12:02:15 PM Scope Out: 12:14:36 PM Scope Withdrawal Time: 0 hours 10 minutes 21 seconds  Total Procedure Duration: 0 hours 12 minutes 21 seconds  Findings:                 The perianal and digital rectal examinations were                            normal.                           Repeat examination of right colon under NBI                            performed.                           A few diverticula were found in the entire colon.                           A diminutive polyp was found in the transverse  colon. The polyp was flat. The polyp was removed                            with a cold snare. Resection and retrieval were                            complete.                           Internal hemorrhoids were found. The hemorrhoids                            were Grade I (internal hemorrhoids that do not                            prolapse).                           The exam was otherwise without abnormality on                            direct and retroflexion views. Complications:            No immediate complications. Estimated Blood Loss:     Estimated blood loss was minimal. Impression:               - Diverticulosis in the entire examined colon.                           - One diminutive polyp in the transverse colon,                            removed with a cold snare. Resected and retrieved.                           - Internal hemorrhoids.                           - The examination was otherwise normal on  direct                            and retroflexion views. Recommendation:           - Patient has a contact number available for                            emergencies. The signs and symptoms of potential                            delayed complications were discussed with the                            patient. Return to normal activities tomorrow.                            Written discharge instructions were provided to the  patient.                           - Resume previous diet.                           - Continue present medications.                           - Await pathology results.                           - Repeat colonoscopy is recommended for                            surveillance. The colonoscopy date will be                            determined after pathology results from today's                            exam become available for review.                           - Preparation H suppository: Insert rectally as                            necessary.                           - If hemorrhoidal bleeding frequent or not                            responding to suppository treatment, return to my                            office to consider hemorrhoidal banding. Arney Mayabb L. Myrtie Neither, MD 01/31/2023 12:19:27 PM This report has been signed electronically.

## 2023-01-31 NOTE — Progress Notes (Signed)
History and Physical:  This patient presents for endoscopic testing for: Encounter Diagnosis  Name Primary?   Rectal bleeding Yes    Clinical details in 11/25/2022 office consult note.  Patient with intermittent rectal bleeding, no clinical changes since he was last seen here.    Patient is otherwise without complaints or active issues today.   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   Lumbar back pain    Wrist clicking      Past Surgical History: Past Surgical History:  Procedure Laterality Date   I-131 therapy  09/1997   maybe 2000-2001   INGUINAL HERNIA REPAIR Right 09/28/2013   Procedure: LAPAROSCOPIC RIGHT  INGUINAL HERNIA;  Surgeon: Axel Filler, MD;  Location: [facility] OR;  Service: General;  Laterality: Right;   INSERTION OF MESH Right 09/28/2013   Procedure: INSERTION OF MESH;  Surgeon: Axel Filler, MD;  Location: [facility] OR;  Service: General;  Laterality: Right;   right knuckle     pin placed in high school    Allergies: Allergies  Allergen Reactions   Simvastatin Nausea Only    REACTION: didn't feel well    Outpatient Meds: Current Outpatient Medications  Medication Sig Dispense Refill   Colchicine 0.6 MG CAPS Day 1: Take 2 caps, then 1 cap an hour later. Day 2 and beyond: 1 cap daily 30 capsule 0   levothyroxine (SYNTHROID) 125 MCG tablet TAKE 2 TABLETS DAILY BEFOREBREAKFAST 180 tablet 0   losartan (COZAAR) 100 MG tablet TAKE 1 TABLET DAILY. 100 tablet 3   sulfaSALAzine (AZULFIDINE) 500 MG tablet Take 2 tablets (1,000 mg total) by mouth 2 (two) times daily. 360 tablet 0   SYNTHROID 112 MCG tablet Take 2 tablets a day on an empty stomach in the morning with water     tadalafil (CIALIS) 5 MG tablet TAKE 1 TABLET DAILY 30 tablet 0   hydrocortisone (ANUSOL-HC) 2.5 % rectal cream Place 1 Application rectally 2 (two)  times daily. (Patient not taking: Reported on 01/01/2023) 30 g 1   meloxicam (MOBIC) 15 MG tablet Take 1 tablet (15 mg total) by mouth daily. (Patient not taking: Reported on 11/25/2022) 30 tablet 0   meloxicam (MOBIC) 15 MG tablet Take 1 tablet (15 mg total) by mouth daily. (Patient not taking: Reported on 10/24/2022) 30 tablet 0   predniSONE (DELTASONE) 5 MG tablet Take 5 mg by mouth daily. (Patient not taking: Reported on 10/24/2022)     Current Facility-Administered Medications  Medication Dose Route Frequency Provider Last Rate Last Admin   0.9 %  sodium chloride infusion  500 mL Intravenous Once Sherrilyn Rist, MD          ___________________________________________________________________ Objective   Exam:  BP (!) 191/106   Pulse 80   Temp 98.9 F (37.2 C) (Temporal)   Resp 12   Ht 6\' 2"  (1.88 m)   Wt 281 lb (127.5 kg)   SpO2 100%   BMI 36.08 kg/m   CV: regular , S1/S2 Resp: clear to auscultation bilaterally, normal RR and effort noted GI: soft, no tenderness, with active bowel sounds.   Assessment: Encounter Diagnosis  Name Primary?   Rectal bleeding Yes     Plan: Colonoscopy   The benefits and risks of the planned procedure were described in detail with the patient or (when appropriate) their health care proxy.  Risks  were outlined as including, but not limited to, bleeding, infection, perforation, adverse medication reaction leading to cardiac or pulmonary decompensation, pancreatitis (if ERCP).  The limitation of incomplete mucosal visualization was also discussed.  No guarantees or warranties were given.  The patient is appropriate for an endoscopic procedure in the ambulatory setting.   - Amada Jupiter, MD

## 2023-02-03 ENCOUNTER — Telehealth: Payer: Self-pay | Admitting: *Deleted

## 2023-02-03 NOTE — Telephone Encounter (Signed)
  Follow up Call-     01/31/2023   10:30 AM  Call back number  Post procedure Call Back phone  # [phone]  Permission to leave phone message Yes     Patient questions:  Do you have a fever, pain , or abdominal swelling? No. Pain Score  0 *  Have you tolerated food without any problems? Yes.    Have you been able to return to your normal activities? Yes.    Do you have any questions about your discharge instructions: Diet   No. Medications  No. Follow up visit  No.  Do you have questions or concerns about your Care? No.  Actions: * If pain score is 4 or above: No action needed, pain <4.

## 2023-02-05 LAB — SURGICAL PATHOLOGY

## 2023-02-08 ENCOUNTER — Encounter: Payer: Self-pay | Admitting: Gastroenterology

## 2023-02-10 ENCOUNTER — Other Ambulatory Visit: Payer: Self-pay | Admitting: Family Medicine

## 2023-03-11 ENCOUNTER — Other Ambulatory Visit: Payer: Self-pay | Admitting: Internal Medicine

## 2023-03-11 DIAGNOSIS — M1380 Other specified arthritis, unspecified site: Secondary | ICD-10-CM

## 2023-03-13 ENCOUNTER — Other Ambulatory Visit: Payer: Self-pay | Admitting: *Deleted

## 2023-03-13 MED ORDER — TADALAFIL 5 MG PO TABS: 5.0000 mg | ORAL_TABLET | Freq: Every day | ORAL | 0 refills | Status: DC

## 2023-03-14 NOTE — Progress Notes (Unsigned)
Office Visit Note  Patient: Alexis Norris             Date of Birth: 01/04/1961           MRN: [id number]             PCP: Ardith Dark, MD Referring: Ardith Dark, MD Visit Date: 03/19/2023   Subjective:  No chief complaint on file.   History of Present Illness: Alexis Norris is a 62 y.o. 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    Previous HPI 01/01/2023 Alexis Norris is a 62 y.o. 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  He started taking 1 tablet twice daily for a week after last visit then increased it to 2 tablets twice daily.  He noticed improvement in his right wrist pain and stiffness as well as back pain.  Still has a few minutes of stiffness waking up every morning.  He has noticed some new achiness affecting both hips feels this is most similar to the soreness after exercising but is not associated with any change in activity.  Also having some pain and stiffness at the right side of his neck with a little more trouble looking side-to-side and feels a nontender nodule in the area.   Previous HPI 11/26/2022 Alexis Norris is a 62 y.o. male here for follow up for inflammatory arthritis concern for possible ankylosing spondylitis versus seronegative inflammatory arthritis with previous history of positive HLA-B27 and Graves' disease and elevated sedimentation rate.  Exam at our initial visit was consistent with active tenosynovitis of the right wrist.  X-ray negative for any local erosions or demineralization.  Lab test showed mild persistent elevated sed rate positive at 24 but this is much improved compared to previous levels.  Since then his wrist is doing slightly better but remains swollen.  He is noticing low back pain and stiffness for a few minutes each morning.   Also thinks he has some increase in the pain and stiffness usually notices getting out of his truck.  No change in the chronic lower leg swelling on both sides.  He was scheduled for updated colonoscopy in October due to scant bright red blood per rectum.   Previous HPI 10/24/22 Alexis Norris is a 62 y.o. male here for evaluation of chronic joint pain in multiple areas associated elevated sedimentation rate and positive HLA-B27.  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  He has a chronic history of some pain and swelling affecting feet and ankles that was previously diagnosed as gout more than 5 years ago.  He was on treatment for this including allopurinol and colchicine but without significant impact on symptoms and if you never had any elevated uric acid blood testing monosodium urate crystals.  This treatment was discontinued a few months ago so far without any major flare.  He has had chronic low back pain for years pretty mild not significantly impacting activities or causing frequent nighttime awakening.  This has gotten a little bit worse over time but in the past few months experienced worsening evaluated sports medicine clinic treated with meloxicam and as needed Flexeril.  He noticed a significant benefit in his joint inflammation when on the meloxicam.  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  Oral NSAIDs and wrist brace quickly improve this but since stopping the meloxicam back to regularly as he has increased swelling again.  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  On review of previous chest x-ray imaging from 2018 also shows pretty extensive thoracic spine anterior osteophyte processes.  Not currently on any specific medication  for joint inflammation has a few minutes of stiffness every morning.  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 Had concern reported for some exophthalmos related to Graves' disease was referred to ophthalmology not yet seen the specialist for this.   No Rheumatology ROS completed.   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   Dyslipidemia 01/13/2007   Essential hypertension 01/13/2007    Past Medical History:  Diagnosis Date   Arthritis    DYSLIPIDEMIA 01/13/2007   no medicines    HYPERTENSION 01/13/2007   HYPOTHYROIDISM, 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   Lumbar back pain    Wrist clicking     Family History  Problem Relation Age of Onset   Cancer Mother    Heart attack Father    Thyroid cancer Sister    Colon cancer Neg Hx    Liver disease Neg Hx    Esophageal cancer Neg Hx    Colon polyps Neg Hx    Rectal cancer Neg Hx    Stomach cancer Neg Hx    Past Surgical History:  Procedure Laterality Date   I-131 therapy  09/1997   maybe 2000-2001   INGUINAL HERNIA REPAIR Right 09/28/2013   Procedure: LAPAROSCOPIC RIGHT  INGUINAL HERNIA;  Surgeon: Axel Filler, MD;  Location: [facility] OR;  Service: General;  Laterality: Right;   INSERTION OF MESH Right 09/28/2013   Procedure: INSERTION OF MESH;  Surgeon: Axel Filler, MD;  Location: [facility] OR;  Service: General;  Laterality: Right;   right knuckle     pin placed in high school   Social History   Social  History Narrative   Works Lobbyist History  Administered Date(s) Administered   [employer] Vaccination 07/09/2019, 08/09/2019   Td 06/28/2002   Tdap 04/10/2020     Objective: Vital Signs: There were no vitals taken for this visit.   Physical Exam   Musculoskeletal Exam: ***  CDAI Exam: CDAI Score: -- Patient Global: --; Provider Global: -- Swollen: --; Tender: -- Joint Exam 03/19/2023   No joint exam has been documented for this visit   There is currently no information documented on the homunculus. Go to the Rheumatology activity and complete the homunculus joint exam.  Investigation: No additional findings.  Imaging: No results found.  Recent Labs: Lab Results  Component Value Date   WBC 5.1 01/01/2023   HGB 12.7 (L) 01/01/2023   PLT 194 01/01/2023   NA 139 01/01/2023   K 3.5 01/01/2023   CL 103 01/01/2023   CO2 24 01/01/2023   GLUCOSE 86 01/01/2023  BUN 10 01/01/2023   CREATININE 1.11 01/01/2023   BILITOT 0.4 01/01/2023   ALKPHOS 69 07/03/2022   AST 15 01/01/2023   ALT 14 01/01/2023   PROT 7.0 01/01/2023   ALBUMIN 4.4 07/03/2022   CALCIUM 8.7 01/01/2023   GFRAA >90 09/24/2013    Speciality Comments: No specialty comments available.  Procedures:  No procedures performed Allergies: Simvastatin   Assessment / Plan:     Visit Diagnoses: No diagnosis found.  ***  Orders: No orders of the defined types were placed in this encounter.  No orders of the defined types were placed in this encounter.    Follow-Up Instructions: No follow-ups on file.   Metta Clines, RT  Note - This record has been created using [employer].  Chart creation errors have been sought, but may not always  have been located. Such creation errors do not reflect on  the standard of medical care.

## 2023-03-19 ENCOUNTER — Encounter: Payer: Self-pay | Admitting: Internal Medicine

## 2023-03-19 ENCOUNTER — Ambulatory Visit: Payer: 59

## 2023-03-19 ENCOUNTER — Ambulatory Visit: Payer: 59 | Attending: Internal Medicine | Admitting: Internal Medicine

## 2023-03-19 VITALS — BP 144/84 | HR 81 | Resp 14 | Ht 73.0 in | Wt 284.0 lb

## 2023-03-19 DIAGNOSIS — M1380 Other specified arthritis, unspecified site: Secondary | ICD-10-CM

## 2023-03-19 DIAGNOSIS — M25551 Pain in right hip: Secondary | ICD-10-CM | POA: Diagnosis not present

## 2023-03-19 DIAGNOSIS — M549 Dorsalgia, unspecified: Secondary | ICD-10-CM

## 2023-03-19 DIAGNOSIS — M25552 Pain in left hip: Secondary | ICD-10-CM

## 2023-03-19 DIAGNOSIS — Z79899 Other long term (current) drug therapy: Secondary | ICD-10-CM

## 2023-03-21 LAB — COMPLETE METABOLIC PANEL WITH GFR
AG Ratio: 1.7 (calc) (ref 1.0–2.5)
ALT: 9 U/L (ref 9–46)
AST: 12 U/L (ref 10–35)
Albumin: 4.4 g/dL (ref 3.6–5.1)
Alkaline phosphatase (APISO): 72 U/L (ref 35–144)
BUN: 13 mg/dL (ref 7–25)
CO2: 28 mmol/L (ref 20–32)
Calcium: 8.7 mg/dL (ref 8.6–10.3)
Chloride: 106 mmol/L (ref 98–110)
Creat: 0.98 mg/dL (ref 0.70–1.35)
Globulin: 2.6 g/dL (ref 1.9–3.7)
Glucose, Bld: 83 mg/dL (ref 65–99)
Potassium: 3.7 mmol/L (ref 3.5–5.3)
Sodium: 142 mmol/L (ref 135–146)
Total Bilirubin: 0.5 mg/dL (ref 0.2–1.2)
Total Protein: 7 g/dL (ref 6.1–8.1)
eGFR: 88 mL/min/{1.73_m2} (ref >=60)

## 2023-03-21 LAB — CBC WITH DIFFERENTIAL/PLATELET
Absolute Lymphocytes: 984 {cells}/uL (ref 850–3900)
Absolute Monocytes: 359 {cells}/uL (ref 200–950)
Basophils Absolute: 41 {cells}/uL (ref 0–200)
Basophils Relative: 1.1 %
Eosinophils Absolute: 178 {cells}/uL (ref 15–500)
Eosinophils Relative: 4.8 %
HCT: 41.5 % (ref 38.5–50.0)
Hemoglobin: 13.5 g/dL (ref 13.2–17.1)
MCH: 27 pg (ref 27.0–33.0)
MCHC: 32.5 g/dL (ref 32.0–36.0)
MCV: 83 fL (ref 80.0–100.0)
MPV: 8.7 fL (ref 7.5–12.5)
Monocytes Relative: 9.7 %
Neutro Abs: 2139 {cells}/uL (ref 1500–7800)
Neutrophils Relative %: 57.8 %
Platelets: 215 10*3/uL (ref 140–400)
RBC: 5 10*6/uL (ref 4.20–5.80)
RDW: 15.7 % — ABNORMAL HIGH (ref 11.0–15.0)
Total Lymphocyte: 26.6 %
WBC: 3.7 10*3/uL — ABNORMAL LOW (ref 3.8–10.8)

## 2023-03-21 LAB — QUANTIFERON-TB GOLD PLUS
Mitogen-NIL: 3.39 [IU]/mL
NIL: 0.05 [IU]/mL
QuantiFERON-TB Gold Plus: NEGATIVE
TB1-NIL: <0 [IU]/mL
TB2-NIL: <0 [IU]/mL

## 2023-03-21 LAB — HEPATITIS B SURFACE ANTIGEN: Hepatitis B Surface Ag: NONREACTIVE

## 2023-03-21 LAB — HEPATITIS B CORE ANTIBODY, IGM: Hep B C IgM: NONREACTIVE

## 2023-03-21 LAB — C-REACTIVE PROTEIN: CRP: <3 mg/L (ref <8.0)

## 2023-03-21 LAB — SEDIMENTATION RATE: Sed Rate: 22 mm/h — ABNORMAL HIGH (ref 0–20)

## 2023-04-02 ENCOUNTER — Ambulatory Visit: Payer: 59 | Admitting: Internal Medicine

## 2023-04-09 ENCOUNTER — Telehealth: Payer: Self-pay | Admitting: *Deleted

## 2023-04-09 NOTE — Telephone Encounter (Signed)
Patient calling regarding lab results and starting new medication. Patient is about to run out of current medication. Please advise.

## 2023-04-11 ENCOUNTER — Other Ambulatory Visit: Payer: Self-pay | Admitting: Family Medicine

## 2023-04-21 ENCOUNTER — Encounter: Payer: 59 | Admitting: Family Medicine

## 2023-04-28 ENCOUNTER — Encounter: Payer: 59 | Admitting: Family Medicine

## 2023-05-04 ENCOUNTER — Other Ambulatory Visit: Payer: Self-pay | Admitting: Family Medicine

## 2023-05-20 ENCOUNTER — Telehealth: Payer: Self-pay

## 2023-05-20 NOTE — Telephone Encounter (Signed)
*  Primary  Pharmacy Patient Advocate Encounter   Received notification from Fax that prior authorization for Tadalafil 5MG  tablets  is required/requested.   Insurance verification completed.   The patient is insured through [facility] .   Per test claim: PA required; PA submitted to above mentioned insurance via CoverMyMeds Key/confirmation #/EOC [facility] Status is pending

## 2023-05-21 NOTE — Telephone Encounter (Signed)
Pharmacy Patient Advocate Encounter  Received notification from [employer] [facility] that Prior Authorization for Tadalafil 5mg   has been DENIED.  Full denial letter will be uploaded to the media tab. See denial reason below.   PA #/Case ID/Reference #: Your plan only covers this drug when it is used for certain health conditions. Covered use is for benign prostatic hyperplasia (BPH). Your plan does not cover this drug for your health condition that your doctor told us you have. We reviewed the information we had. Your request has been denied. Your doctor can send [place] any new or missing information for [place] to review. For this drug, you may have to meet other criteria. You can request the drug policy for more details. You can also request other plan documents for your review

## 2023-05-29 ENCOUNTER — Ambulatory Visit: Payer: 59 | Admitting: Family Medicine

## 2023-05-29 ENCOUNTER — Encounter: Payer: Self-pay | Admitting: Family Medicine

## 2023-05-29 VITALS — BP 119/78 | HR 80 | Temp 99.0°F | Ht 73.0 in | Wt 285.0 lb

## 2023-05-29 DIAGNOSIS — M1380 Other specified arthritis, unspecified site: Secondary | ICD-10-CM

## 2023-05-29 DIAGNOSIS — I10 Essential (primary) hypertension: Secondary | ICD-10-CM

## 2023-05-29 DIAGNOSIS — E785 Hyperlipidemia, unspecified: Secondary | ICD-10-CM

## 2023-05-29 DIAGNOSIS — E559 Vitamin D deficiency, unspecified: Secondary | ICD-10-CM

## 2023-05-29 DIAGNOSIS — Z125 Encounter for screening for malignant neoplasm of prostate: Secondary | ICD-10-CM | POA: Diagnosis not present

## 2023-05-29 DIAGNOSIS — Z0001 Encounter for general adult medical examination with abnormal findings: Secondary | ICD-10-CM

## 2023-05-29 DIAGNOSIS — E890 Postprocedural hypothyroidism: Secondary | ICD-10-CM

## 2023-05-29 DIAGNOSIS — M109 Gout, unspecified: Secondary | ICD-10-CM

## 2023-05-29 DIAGNOSIS — N529 Male erectile dysfunction, unspecified: Secondary | ICD-10-CM | POA: Diagnosis not present

## 2023-05-29 DIAGNOSIS — R21 Rash and other nonspecific skin eruption: Secondary | ICD-10-CM

## 2023-05-29 DIAGNOSIS — L985 Mucinosis of the skin: Secondary | ICD-10-CM

## 2023-05-29 DIAGNOSIS — Z131 Encounter for screening for diabetes mellitus: Secondary | ICD-10-CM

## 2023-05-29 LAB — VITAMIN D 25 HYDROXY (VIT D DEFICIENCY, FRACTURES): VITD: 9.25 ng/mL — ABNORMAL LOW (ref 30.00–100.00)

## 2023-05-29 LAB — COMPREHENSIVE METABOLIC PANEL
ALT: 7 U/L (ref 0–53)
AST: 12 U/L (ref 0–37)
Albumin: 4.4 g/dL (ref 3.5–5.2)
Alkaline Phosphatase: 80 U/L (ref 39–117)
BUN: 10 mg/dL (ref 6–23)
CO2: 25 meq/L (ref 19–32)
Calcium: 9.1 mg/dL (ref 8.4–10.5)
Chloride: 108 meq/L (ref 96–112)
Creatinine, Ser: 0.85 mg/dL (ref 0.40–1.50)
GFR: 94.6 mL/min (ref >60.00)
Glucose, Bld: 84 mg/dL (ref 70–99)
Potassium: 3.8 meq/L (ref 3.5–5.1)
Sodium: 142 meq/L (ref 135–145)
Total Bilirubin: 0.5 mg/dL (ref 0.2–1.2)
Total Protein: 6.8 g/dL (ref 6.0–8.3)

## 2023-05-29 LAB — LIPID PANEL
Cholesterol: 153 mg/dL (ref 0–200)
HDL: 52 mg/dL (ref >39.00)
LDL Cholesterol: 79 mg/dL (ref 0–99)
NonHDL: 101.08
Total CHOL/HDL Ratio: 3
Triglycerides: 108 mg/dL (ref 0.0–149.0)
VLDL: 21.6 mg/dL (ref 0.0–40.0)

## 2023-05-29 LAB — CBC
HCT: 41.7 % (ref 39.0–52.0)
Hemoglobin: 13.8 g/dL (ref 13.0–17.0)
MCHC: 33.2 g/dL (ref 30.0–36.0)
MCV: 83.9 fL (ref 78.0–100.0)
Platelets: 255 10*3/uL (ref 150.0–400.0)
RBC: 4.97 Mil/uL (ref 4.22–5.81)
RDW: 14.3 % (ref 11.5–15.5)
WBC: 6.3 10*3/uL (ref 4.0–10.5)

## 2023-05-29 LAB — HEMOGLOBIN A1C: Hgb A1c MFr Bld: 5 % (ref 4.6–6.5)

## 2023-05-29 LAB — URIC ACID: Uric Acid, Serum: 4.6 mg/dL (ref 4.0–7.8)

## 2023-05-29 LAB — TSH: TSH: 7.93 u[IU]/mL — ABNORMAL HIGH (ref 0.35–5.50)

## 2023-05-29 LAB — PSA: PSA: 1.26 ng/mL (ref 0.10–4.00)

## 2023-05-29 MED ORDER — TADALAFIL 10 MG PO TABS: 10.0000 mg | ORAL_TABLET | Freq: Every day | ORAL | 3 refills | Status: DC

## 2023-05-29 NOTE — Patient Instructions (Signed)
It was very nice to see you today!  We will check blood work.   I will send a new prescription in for Cialis to your pharmacy.  Please continue to work on diet and exercise.  Please try using the lotion and moisturizers on your skin.  You can use a little bit of hydrocortisone cream as needed as well.  I will see back in a year for your next physical.  Come back sooner if needed.  Return in about 1 year (around 05/28/2024) for Annual Physical.   Take care, Dr Jimmey Ralph  PLEASE NOTE:  If you had any lab tests, please let us know if you have not heard back within a few days. You may see your results on mychart before we have a chance to review them but we will give you a call once they are reviewed by [place].   If we ordered any referrals today, please let us know if you have not heard from their office within the next week.   If you had any urgent prescriptions sent in today, please check with the pharmacy within an hour of our visit to make sure the prescription was transmitted appropriately.   Please try these tips to maintain a healthy lifestyle:  Eat at least 3 REAL meals and 1-2 snacks per day.  Aim for no more than 5 hours between eating.  If you eat breakfast, please do so within one hour of getting up.   Each meal should contain half fruits/vegetables, one quarter protein, and one quarter carbs (no bigger than a computer mouse)  Cut down on sweet beverages. This includes juice, soda, and sweet tea.   Drink at least 1 glass of water with each meal and aim for at least 8 glasses per day  Exercise at least 150 minutes every week.    Preventive Care 58-42 Years Old, Male Preventive care refers to lifestyle choices and visits with your health care provider that can promote health and wellness. Preventive care visits are also called wellness exams. What can I expect for my preventive care visit? Counseling During your preventive care visit, your health care provider may ask about  your: Medical history, including: Past medical problems. Family medical history. Current health, including: Emotional well-being. Home life and relationship well-being. Sexual activity. Lifestyle, including: Alcohol, nicotine or tobacco, and drug use. Access to firearms. Diet, exercise, and sleep habits. Safety issues such as seatbelt and bike helmet use. Sunscreen use. Work and work Astronomer. Physical exam Your health care provider will check your: Height and weight. These may be used to calculate your BMI (body mass index). BMI is a measurement that tells if you are at a healthy weight. Waist circumference. This measures the distance around your waistline. This measurement also tells if you are at a healthy weight and may help predict your risk of certain diseases, such as type 2 diabetes and high blood pressure. Heart rate and blood pressure. Body temperature. Skin for abnormal spots. What immunizations do I need?  Vaccines are usually given at various ages, according to a schedule. Your health care provider will recommend vaccines for you based on your age, medical history, and lifestyle or other factors, such as travel or where you work. What tests do I need? Screening Your health care provider may recommend screening tests for certain conditions. This may include: Lipid and cholesterol levels. Diabetes screening. This is done by checking your blood sugar (glucose) after you have not eaten for a while (fasting). Hepatitis  B test. Hepatitis C test. HIV (human immunodeficiency virus) test. STI (sexually transmitted infection) testing, if you are at risk. Lung cancer screening. Prostate cancer screening. Colorectal cancer screening. Talk with your health care provider about your test results, treatment options, and if necessary, the need for more tests. Follow these instructions at home: Eating and drinking  Eat a diet that includes fresh fruits and vegetables, whole  grains, lean protein, and low-fat dairy products. Take vitamin and mineral supplements as recommended by your health care provider. Do not drink alcohol if your health care provider tells you not to drink. If you drink alcohol: Limit how much you have to 0-2 drinks a day. Know how much alcohol is in your drink. In the [place] one drink equals one 12 oz bottle of beer (355 mL), one 5 oz glass of wine (148 mL), or one 1 oz glass of hard liquor (44 mL). Lifestyle Brush your teeth every morning and night with fluoride toothpaste. Floss one time each day. Exercise for at least 30 minutes 5 or more days each week. Do not use any products that contain nicotine or tobacco. These products include cigarettes, chewing tobacco, and vaping devices, such as e-cigarettes. If you need help quitting, ask your health care provider. Do not use drugs. If you are sexually active, practice safe sex. Use a condom or other form of protection to prevent STIs. Take aspirin only as told by your health care provider. Make sure that you understand how much to take and what form to take. Work with your health care provider to find out whether it is safe and beneficial for you to take aspirin daily. Find healthy ways to manage stress, such as: Meditation, yoga, or listening to music. Journaling. Talking to a trusted person. Spending time with friends and family. Minimize exposure to UV radiation to reduce your risk of skin cancer. Safety Always wear your seat belt while driving or riding in a vehicle. Do not drive: If you have been drinking alcohol. Do not ride with someone who has been drinking. When you are tired or distracted. While texting. If you have been using any mind-altering substances or drugs. Wear a helmet and other protective equipment during sports activities. If you have firearms in your house, make sure you follow all gun safety procedures. What's next? Go to your health care provider once a year for  an annual wellness visit. Ask your health care provider how often you should have your eyes and teeth checked. Stay up to date on all vaccines. This information is not intended to replace advice given to you by your health care provider. Make sure you discuss any questions you have with your health care provider. Document Revised: 10/11/2020 Document Reviewed: 10/11/2020 Elsevier Patient Education  2024 [employer].

## 2023-05-29 NOTE — Progress Notes (Signed)
Chief Complaint:  Alexis Norris is a 63 y.o. male who presents today for his annual comprehensive physical exam.    Assessment/Plan:  New/Acute Problems: Rash Patient with mild dermatitis likely due to xerosis cutis.  Recommended twice daily emollients.  He can use cortisone cream as needed as well.  He will let us know if not improving though he can discuss with dermatology at his upcoming appointment as well.  Chronic Problems Addressed Today: Hypothyroidism following radioiodine therapy Follows with endocrinology.  He is on Synthroid 250 mcg daily.  Will check TSH today.  Dyslipidemia Check lipids.  We discussed lifestyle modifications.  Essential hypertension At goal today on losartan 100 mg daily.  Gout Check uric acid.  No recent flares.  Takes colchicine as needed though has not needed to use this for a while.  Erectile dysfunction He has been prescribed Cialis 5 mg daily.  This works modestly well though he is interested in increasing the dose.  Will go to 10 mg daily as needed.  We discussed potential side effects.  Vitamin D deficiency Check vitamin D.  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  Symptoms are overall stable.  Diffuse cutaneous mucinosis Continue management per dermatology.  Preventative Healthcare: Check labs. UpToDate on colon cancer screening. Declined flu vaccines.  He may be interested in shingles vaccine however deferred for today.  We can discuss again next year or he will come in sooner to have this done if he wishes.  Patient Counseling(The following topics were reviewed and/or handout was given):  -Nutrition: Stressed importance of moderation in sodium/caffeine intake, saturated fat and cholesterol, caloric balance, sufficient intake of fresh fruits, vegetables, and fiber.  -Stressed the importance of regular exercise.   -Substance Abuse: Discussed cessation/primary prevention of tobacco, alcohol, or other  drug use; driving or other dangerous activities under the influence; availability of treatment for abuse.   -Injury prevention: Discussed safety belts, safety helmets, smoke detector, smoking near bedding or upholstery.   -Sexuality: Discussed sexually transmitted diseases, partner selection, use of condoms, avoidance of unintended pregnancy and contraceptive alternatives.   -Dental health: Discussed importance of regular tooth brushing, flossing, and dental visits.  -Health maintenance and immunizations reviewed. Please refer to Health maintenance section.  Return to care in 1 year for next preventative visit.     Subjective:  HPI:  Patient here today for annual physical.  See A/P for status of chronic conditions.  Has noticed a rash on his abdomen last day or so.  He does note he has had quite a bit of dry skin the last few weeks due to the weather.  He has had some itching to the area as well.  No new detergents or other exposures.  No specific treatments tried.  Lifestyle Diet: None specific.  Exercise: None specific.      05/29/2023    1:12 PM  Depression screen PHQ 2/9  Decreased Interest 0  Down, Depressed, Hopeless 0  PHQ - 2 Score 0    There are no preventive care reminders to display for this patient.   ROS: Per HPI, otherwise a complete review of systems was negative.   PMH:  The following were reviewed and entered/updated in epic: Past Medical History:  Diagnosis Date   Arthritis    DYSLIPIDEMIA 01/13/2007   no medicines    HYPERTENSION 01/13/2007   HYPOTHYROIDISM, 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   Lumbar back pain    Wrist clicking    Patient Active Problem List   Diagnosis  Date Noted   Bilateral hip pain 01/01/2023   Seronegative inflammatory arthritis 11/26/2022   Low back pain 08/01/2022   Vitamin D deficiency 08/01/2022   Diverticulosis 04/17/2022   GERD (gastroesophageal reflux disease) 04/17/2022   Erectile dysfunction 04/11/2021   Diffuse cutaneous  mucinosis 04/10/2020   Gout 02/06/2016   High risk medication use 08/26/2011   Allergic rhinitis 08/26/2011   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   Dyslipidemia 01/13/2007   Essential hypertension 01/13/2007   Past Surgical History:  Procedure Laterality Date   I-131 therapy  09/1997   maybe 2000-2001   INGUINAL HERNIA REPAIR Right 09/28/2013   Procedure: LAPAROSCOPIC RIGHT  INGUINAL HERNIA;  Surgeon: Axel Filler, MD;  Location: [facility] OR;  Service: General;  Laterality: Right;   INSERTION OF MESH Right 09/28/2013   Procedure: INSERTION OF MESH;  Surgeon: Axel Filler, MD;  Location: [facility] OR;  Service: General;  Laterality: Right;   right knuckle     pin placed in high school    Family History  Problem Relation Age of Onset   Cancer Mother    Heart attack Father    Thyroid cancer Sister    Colon cancer Neg Hx    Liver disease Neg Hx    Esophageal cancer Neg Hx    Colon polyps Neg Hx    Rectal cancer Neg Hx    Stomach cancer Neg Hx     Medications- reviewed and updated Current Outpatient Medications  Medication Sig Dispense Refill   Colchicine 0.6 MG CAPS Day 1: Take 2 caps, then 1 cap an hour later. Day 2 and beyond: 1 cap daily 30 capsule 0   hydrocortisone (ANUSOL-HC) 2.5 % rectal cream Place 1 Application rectally 2 (two) times daily. 30 g 1   levothyroxine (SYNTHROID) 125 MCG tablet TAKE 2 TABLETS DAILY BEFOREBREAKFAST 180 tablet 0   losartan (COZAAR) 100 MG tablet TAKE 1 TABLET DAILY. 100 tablet 3   meloxicam (MOBIC) 15 MG tablet Take 1 tablet (15 mg total) by mouth daily. 30 tablet 0   meloxicam (MOBIC) 15 MG tablet Take 1 tablet (15 mg total) by mouth daily. 30 tablet 0   predniSONE (DELTASONE) 5 MG tablet Take 5 mg by mouth daily.     sulfaSALAzine (AZULFIDINE) 500 MG tablet Take 2 tablets (1,000 mg total) by mouth 2 (two) times daily. 360 tablet 0   tadalafil (CIALIS) 10 MG tablet Take 1 tablet (10 mg total) by mouth daily. 90 tablet 3   Current  Facility-Administered Medications  Medication Dose Route Frequency Provider Last Rate Last Admin   0.9 %  sodium chloride infusion  500 mL Intravenous Once Danis, Andreas Blower, MD        Allergies-reviewed and updated Allergies  Allergen Reactions   Simvastatin Nausea Only    REACTION: didn't feel well    Social History   Socioeconomic History   Marital status: Married    Spouse name: Not on file   Number of children: 0   Years of education: Not on file   Highest education level: Not on file  Occupational History   Occupation: Paramedic  Tobacco Use   Smoking status: Never    Passive exposure: Past   Smokeless tobacco: Never  Vaping Use   Vaping status: Never Used  Substance and Sexual Activity   Alcohol use: Yes    Comment: infrequently   Drug use: No   Sexual activity: Not on file  Other Topics Concern   Not on  file  Social History Narrative   Works Forensic scientist   Social Drivers of Corporate investment banker Strain: Not on [employer] Insecurity: Not on file  Transportation Needs: Not on file  Physical Activity: Not on file  Stress: Not on file  Social Connections: Not on file        Objective:  Physical Exam: BP 119/78   Pulse 80   Temp 99 F (37.2 C) (Temporal)   Ht 6\' 1"  (1.854 m)   Wt 285 lb (129.3 kg)   SpO2 98%   BMI 37.60 kg/m   Body mass index is 37.6 kg/m. Wt Readings from Last 3 Encounters:  05/29/23 285 lb (129.3 kg)  03/19/23 284 lb (128.8 kg)  01/31/23 281 lb (127.5 kg)   Gen: NAD, resting comfortably HEENT: TMs normal bilaterally. OP clear. No thyromegaly noted.  CV: RRR with no murmurs appreciated Pulm: NWOB, CTAB with no crackles, wheezes, or rhonchi GI: Normal bowel sounds present. Soft, Nontender, Nondistended. MSK: no edema, cyanosis, or clubbing noted Skin: warm, dry.  Xerosis cutis noted on the abdomen with several areas of excoriation and urticaria. Neuro: CN2-12 grossly intact. Strength 5/5 in upper and lower  extremities. Reflexes symmetric and intact bilaterally.  Psych: Normal affect and thought content     Ronon Ferger M. Jimmey Ralph, MD 05/29/2023 1:43 PM

## 2023-05-29 NOTE — Assessment & Plan Note (Signed)
Check vitamin D.

## 2023-05-29 NOTE — Assessment & Plan Note (Signed)
Check uric acid.  No recent flares.  Takes colchicine as needed though has not needed to use this for a while.

## 2023-05-29 NOTE — Assessment & Plan Note (Signed)
At goal today on losartan 100 mg daily.

## 2023-05-29 NOTE — Assessment & Plan Note (Signed)
Check lipids.  We discussed lifestyle modifications.

## 2023-05-29 NOTE — Assessment & Plan Note (Signed)
He has been prescribed Cialis 5 mg daily.  This works modestly well though he is interested in increasing the dose.  Will go to 10 mg daily as needed.  We discussed potential side effects.

## 2023-05-29 NOTE — Assessment & Plan Note (Addendum)
Follows with endocrinology.  He is on Synthroid 250 mcg daily.  Will check TSH today.

## 2023-05-29 NOTE — Assessment & Plan Note (Signed)
Continue management per dermatology.

## 2023-05-29 NOTE — Assessment & Plan Note (Signed)
On sulfasalazine per rheumatology.  Symptoms are overall stable.

## 2023-05-30 ENCOUNTER — Telehealth: Payer: Self-pay | Admitting: Family Medicine

## 2023-05-30 ENCOUNTER — Encounter: Payer: Self-pay | Admitting: Family Medicine

## 2023-05-30 NOTE — Telephone Encounter (Signed)
 Med refill

## 2023-05-30 NOTE — Progress Notes (Signed)
His TSH is not at goal.  Recommend he follow-up with endocrinology to discuss changing dose.  Vitamin D is very low.  Recommend starting 50,000 IUs once weekly.  Please send this in.  I would like for him to come back in 3 to 6 months.  The rest of his labs are all at goal and we can recheck everything else in a year.

## 2023-06-02 ENCOUNTER — Other Ambulatory Visit: Payer: Self-pay | Admitting: *Deleted

## 2023-06-02 MED ORDER — VITAMIN D (ERGOCALCIFEROL) 1.25 MG (50000 UNIT) PO CAPS: 50000.0000 [IU] | ORAL_CAPSULE | ORAL | 0 refills | Status: DC

## 2023-06-03 ENCOUNTER — Other Ambulatory Visit: Payer: Self-pay | Admitting: *Deleted

## 2023-06-03 ENCOUNTER — Telehealth: Payer: Self-pay | Admitting: Family Medicine

## 2023-06-03 MED ORDER — TADALAFIL 10 MG PO TABS: 10.0000 mg | ORAL_TABLET | Freq: Every day | ORAL | 1 refills | Status: DC

## 2023-06-03 NOTE — Telephone Encounter (Signed)
 Copied from CRM [id number]. Topic: Clinical - Prescription Issue >> Jun 03, 2023 10:26 AM Mercedes MATSU wrote: Reason for CRM: Patient states that his tadalafil  (CIALIS ) 10 MG tablet was sent to the wrong pharmacy ([facility]) and they do not cover the medication. Patient is requesting for it to be sent to [address] [place] [address] Phone: [phone] Fax: [phone] Hours: Not open 24 hours  Patient is also requesting a call back confirming if he will need blood work at his 3 month follow up appointment that he has scheduled 5/5.

## 2023-06-03 NOTE — Telephone Encounter (Signed)
Rx Cialis send to [facility] pharmacy  Patient aware

## 2023-06-11 ENCOUNTER — Other Ambulatory Visit: Payer: Self-pay | Admitting: *Deleted

## 2023-06-11 MED ORDER — LOSARTAN POTASSIUM 100 MG PO TABS: 100.0000 mg | ORAL_TABLET | Freq: Every day | ORAL | 3 refills | Status: DC

## 2023-06-11 NOTE — Progress Notes (Unsigned)
 Office Visit Note  Patient: Alexis Norris             Date of Birth: Sep 10, 1960           MRN: [id number]             PCP: Ardith Dark, MD Referring: Ardith Dark, MD Visit Date: 06/25/2023   Subjective:  Follow-up (Patient states his morning back pain and stiffness seems to be coming back in the last couple of weeks. )   History of Present Illness: Alexis Norris is a 63 y.o. male here for follow up for ankylosing spondylitis.    Previous HPI 03/19/2023 Alexis Norris is a 63 y.o. 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  Right hand and wrist symptoms have been very well-controlled just gets occasional pain with use related for his work.  Has noticed worsening the past few months pain in the middle of his back.  This is most severe at night and first thing in the morning.  Often awakens him from sleep.  Symptoms improve walking throughout the day he does notice a bit worse of forward bending after working.   Previous HPI 01/01/2023 Alexis Norris is a 63 y.o. 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  He started taking 1 tablet twice daily for a week after last visit then increased it to 2 tablets twice daily.  He noticed improvement in his right wrist pain and stiffness as well as back pain.  Still has a few minutes of stiffness waking up every morning.  He has noticed some new achiness affecting both hips feels this is most similar to the soreness after exercising but is not associated with any change in activity.  Also having some pain and stiffness at the right side of his neck with a little more trouble looking side-to-side and feels a nontender nodule in the area.   Previous HPI 11/26/2022 Alexis Norris is a 63 y.o. male here for follow up for inflammatory arthritis  concern for possible ankylosing spondylitis versus seronegative inflammatory arthritis with previous history of positive HLA-B27 and Graves' disease and elevated sedimentation rate.  Exam at our initial visit was consistent with active tenosynovitis of the right wrist.  X-ray negative for any local erosions or demineralization.  Lab test showed mild persistent elevated sed rate positive at 24 but this is much improved compared to previous levels.  Since then his wrist is doing slightly better but remains swollen.  He is noticing low back pain and stiffness for a few minutes each morning.  Also thinks he has some increase in the pain and stiffness usually notices getting out of his truck.  No change in the chronic lower leg swelling on both sides.  He was scheduled for updated colonoscopy in October due to scant bright red blood per rectum.   Previous HPI 10/24/22 Alexis Norris is a 63 y.o. male here for evaluation of chronic joint pain in multiple areas associated elevated sedimentation rate and positive HLA-B27.  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  He has a chronic history of some pain and swelling affecting feet and ankles that was previously diagnosed as gout more than 5 years ago.  He was on treatment for this including allopurinol and  colchicine but without significant impact on symptoms and if you never had any elevated uric acid blood testing monosodium urate crystals.  This treatment was discontinued a few months ago so far without any major flare.  He has had chronic low back pain for years pretty mild not significantly impacting activities or causing frequent nighttime awakening.  This has gotten a little bit worse over time but in the past few months experienced worsening evaluated sports medicine clinic treated with meloxicam and as needed Flexeril.  He noticed a  significant benefit in his joint inflammation when on the meloxicam.  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  Oral NSAIDs and wrist brace quickly improve this but since stopping the meloxicam back to regularly as he has increased swelling again.  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  On review of previous chest x-ray imaging from 2018 also shows pretty extensive thoracic spine anterior osteophyte processes.  Not currently on any specific medication for joint inflammation has a few minutes of stiffness every morning.  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 Had concern reported for some exophthalmos related to Graves' disease was referred to ophthalmology not yet seen the specialist for this.   Review of Systems  Constitutional:  Negative for fatigue.  HENT:  Negative for mouth sores and mouth dryness.   Eyes:  Negative for dryness.  Respiratory:  Negative for shortness of breath.   Cardiovascular:  Negative for chest pain and palpitations.  Gastrointestinal:  Negative for blood in stool, constipation and diarrhea.  Endocrine: Positive for increased urination.  Genitourinary:  Negative for involuntary urination.  Musculoskeletal:  Positive for joint pain, joint pain, myalgias, morning stiffness, muscle tenderness and myalgias. Negative for gait problem, joint swelling and muscle weakness.  Skin:  Negative for color change, rash, hair loss and sensitivity to sunlight.  Allergic/Immunologic: Negative for susceptible to infections.  Neurological:  Negative for dizziness and headaches.  Hematological:  Negative for swollen glands.  Psychiatric/Behavioral:  Positive for sleep disturbance. Negative for depressed mood. The patient is not nervous/anxious.     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   Dyslipidemia 01/13/2007   Essential hypertension 01/13/2007    Past Medical History:  Diagnosis Date   Arthritis    DYSLIPIDEMIA 01/13/2007   no medicines    HYPERTENSION 01/13/2007   HYPOTHYROIDISM, 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   Lumbar back pain    Wrist clicking     Family History  Problem Relation Age of Onset   Cancer Mother    Heart attack Father    Thyroid cancer Sister    Colon cancer Neg Hx    Liver disease Neg Hx    Esophageal cancer Neg Hx    Colon polyps Neg Hx    Rectal cancer Neg Hx    Stomach cancer Neg Hx    Past Surgical History:  Procedure Laterality Date   COLONOSCOPY  I-131 therapy  09/27/1997   maybe 2000-2001   INGUINAL HERNIA REPAIR Right 09/28/2013   Procedure: LAPAROSCOPIC RIGHT  INGUINAL HERNIA;  Surgeon: Axel Filler, MD;  Location: [facility] OR;  Service: General;  Laterality: Right;   INSERTION OF MESH Right 09/28/2013   Procedure: INSERTION OF MESH;  Surgeon: Axel Filler, MD;  Location: [facility] OR;  Service: General;  Laterality: Right;   right knuckle     pin placed in high school   Social History   Social History Narrative   Works Lobbyist History  Administered Date(s) Administered   [employer] Vaccination 07/09/2019, 08/09/2019, 04/28/2020   Td 06/28/2002   Tdap 04/10/2020     Objective: Vital Signs: BP (!) 151/79 (BP Location: Left Arm, Patient Position: Sitting, Cuff Size: Large)   Pulse 78   Resp 14   Ht 6\' 2"  (1.88 m)   Wt 288 lb  (130.6 kg)   BMI 36.98 kg/m    Physical Exam   Musculoskeletal Exam: ***  CDAI Exam: CDAI Score: -- Patient Global: --; Provider Global: -- Swollen: --; Tender: -- Joint Exam 06/25/2023   No joint exam has been documented for this visit   There is currently no information documented on the homunculus. Go to the Rheumatology activity and complete the homunculus joint exam.  Investigation: No additional findings.  Imaging: No results found.  Recent Labs: Lab Results  Component Value Date   WBC 6.3 05/29/2023   HGB 13.8 05/29/2023   PLT 255.0 05/29/2023   NA 142 05/29/2023   K 3.8 05/29/2023   CL 108 05/29/2023   CO2 25 05/29/2023   GLUCOSE 84 05/29/2023   BUN 10 05/29/2023   CREATININE 0.85 05/29/2023   BILITOT 0.5 05/29/2023   ALKPHOS 80 05/29/2023   AST 12 05/29/2023   ALT 7 05/29/2023   PROT 6.8 05/29/2023   ALBUMIN 4.4 05/29/2023   CALCIUM 9.1 05/29/2023   GFRAA >90 09/24/2013   QFTBGOLDPLUS NEGATIVE 03/19/2023    Speciality Comments: No specialty comments available.  Procedures:  No procedures performed Allergies: Simvastatin   Assessment / Plan:     Visit Diagnoses: Seronegative inflammatory arthritis - Plan: Sedimentation rate, C-reactive protein  High risk medication use - SSZ 1000 mg BID.  Bilateral hip pain  Mid back pain  Ankylosing spondylitis of thoracolumbar region [facility])  ***  Orders: Orders Placed This Encounter  Procedures   Sedimentation rate   C-reactive protein   No orders of the defined types were placed in this encounter.    Follow-Up Instructions: No follow-ups on file.   Fuller Plan, MD  Note - This record has been created using [employer].  Chart creation errors have been sought, but may not always  have been located. Such creation errors do not reflect on  the standard of medical care.

## 2023-06-25 ENCOUNTER — Ambulatory Visit: Payer: 59 | Attending: Internal Medicine | Admitting: Internal Medicine

## 2023-06-25 ENCOUNTER — Encounter: Payer: Self-pay | Admitting: Internal Medicine

## 2023-06-25 VITALS — BP 151/79 | HR 78 | Resp 14 | Ht 74.0 in | Wt 288.0 lb

## 2023-06-25 DIAGNOSIS — M549 Dorsalgia, unspecified: Secondary | ICD-10-CM

## 2023-06-25 DIAGNOSIS — Z79899 Other long term (current) drug therapy: Secondary | ICD-10-CM

## 2023-06-25 DIAGNOSIS — M25551 Pain in right hip: Secondary | ICD-10-CM

## 2023-06-25 DIAGNOSIS — M455 Ankylosing spondylitis of thoracolumbar region: Secondary | ICD-10-CM

## 2023-06-25 DIAGNOSIS — M1380 Other specified arthritis, unspecified site: Secondary | ICD-10-CM

## 2023-06-25 DIAGNOSIS — M25552 Pain in left hip: Secondary | ICD-10-CM

## 2023-06-26 LAB — SEDIMENTATION RATE: Sed Rate: 28 mm/h — ABNORMAL HIGH (ref 0–20)

## 2023-06-26 LAB — C-REACTIVE PROTEIN: CRP: <3 mg/L (ref <8.0)

## 2023-07-03 ENCOUNTER — Telehealth: Payer: Self-pay | Admitting: *Deleted

## 2023-07-03 DIAGNOSIS — G8929 Other chronic pain: Secondary | ICD-10-CM

## 2023-07-03 DIAGNOSIS — M455 Ankylosing spondylitis of thoracolumbar region: Secondary | ICD-10-CM

## 2023-07-03 NOTE — Telephone Encounter (Signed)
 Patient contacted the office asking for his lab results. Patient also states it was mentioned possible medication to be started. Patient states he went and seen his dermatologist whom was supposed to send over notes to recommend medication that would be for treatment here as well as what she treats. Patient would like to know if you have received those note and reviewed. Patient is also asking for a referral for PT at [facility] location for low back pain and stiffness. Please advise.

## 2023-07-04 NOTE — Progress Notes (Signed)
 Sedimentation rate remains increased at 28, worse than 3 months prior although not as bad as we first saw.

## 2023-07-04 NOTE — Telephone Encounter (Signed)
 I spoke with Alexis Norris about Alexis Norris. He is agreeable so we can start the process for this.  He does not have any strong contraindications. Age and weight risk factors for CVD. He had a previous normal heart cath although was years ago. No history of clots, diverticulitis, cancer.  He will plan to get a shingles vaccine through PCP office or pharmacy.

## 2023-07-04 NOTE — Addendum Note (Signed)
 Addended by: Metta Clines on: 07/04/2023 08:34 AM   Modules accepted: Orders

## 2023-07-04 NOTE — Telephone Encounter (Signed)
 Referral Placed

## 2023-07-04 NOTE — Telephone Encounter (Signed)
 Contacted the patient and advised patient the PT referral was placed. Advised the patient Dr. Dimple Casey was able to review the dermatologist notes their recommendation was to consider [facility] for treatment. This is a daily oral medication for ankylosing spondylitis. Dr. Dimple Casey believes this is a good option for him but would need to discuss it regarding monitoring and side effects. He can call back about it or let me know when he would be available. Patient states he would be available to talk today anytime after 11 am. Advised the patient I would let Dr. Dimple Casey know.

## 2023-07-04 NOTE — Telephone Encounter (Signed)
 Yes please order a referral to PT for low back pain and stiffness related to ankylosing spondylitis.  I was able to review the dermatologist notes their recommendation was to consider [facility] for treatment. This is a daily oral medication for ankylosing spondylitis. I believe this is a good option for him but would need to discuss it regarding monitoring and side effects. He can call back about it or let me know when he would be available.

## 2023-07-08 ENCOUNTER — Telehealth: Payer: Self-pay | Admitting: Pharmacist

## 2023-07-08 NOTE — Telephone Encounter (Addendum)
 Patient is new start to [name]  Dose: XR 11mg  once daily  Dx: AS  Submitted a Prior Authorization request to [facility] for [facility] via CoverMyMeds. Will update once we receive a response.  Key: Sammie Bench is likely to be denied because: The preferred products for which coverage is provided for the treatment of ankylosing spondylitis are adalimumab-adaz, Cosentyx [place], Enbrel, Hyrimoz, Remicade, [name], and Simponi Aria

## 2023-07-09 ENCOUNTER — Telehealth: Payer: Self-pay | Admitting: Pharmacist

## 2023-07-09 NOTE — Telephone Encounter (Signed)
 Received a fax regarding Prior Authorization from [facility] for [facility]. Authorization has been DENIED because Alexis Norris is not preferred:  Preferred self-administered drugs are: adalimumab-adaz/Hyrimoz, Cosentyx, Enbrel, [name].  Preferred provider-administered drugs are: Remicade or Simponi Aria  Spoke with Dr. Dimple Casey and okay to move forward with [name] though there is less literature available for [name] use in mucinosis Key: [id number]  [name], PharmD, MPH, BCPS, CPP Clinical Pharmacist (Rheumatology and Pulmonology)

## 2023-07-09 NOTE — Telephone Encounter (Signed)
 [name] denied.  Spoke with Dr. Dimple Casey and okay to move forward with [name] though there is less literature available for [name] use in mucinosis Key: [id number]  Alexis Norris, PharmD, MPH, BCPS, CPP Clinical Pharmacist (Rheumatology and Pulmonology)

## 2023-07-11 NOTE — Telephone Encounter (Signed)
 That is unfortunate.  I do not think trying to seek a dual use for mucinosis which is not an approved indication would go very far arguing this.  He does not have any strong contraindication against starting him on adalimumab instead. Would you be able to give him a call about it?  He has not been on anything so I would recommend a new start visit if he's agreeable to an injectable.

## 2023-07-11 NOTE — Telephone Encounter (Signed)
 Received a fax regarding Prior Authorization from [facility] for [name]. Authorization has been DENIED because patient must have tried TNF inhibitors and they were not effective  [name], PharmD, MPH, BCPS, CPP Clinical Pharmacist (Rheumatology and Pulmonology)

## 2023-07-14 ENCOUNTER — Telehealth: Payer: Self-pay | Admitting: Pharmacist

## 2023-07-14 DIAGNOSIS — M455 Ankylosing spondylitis of thoracolumbar region: Secondary | ICD-10-CM

## 2023-07-14 DIAGNOSIS — Z79899 Other long term (current) drug therapy: Secondary | ICD-10-CM

## 2023-07-14 NOTE — Telephone Encounter (Signed)
 Spoke with patient regarding both [name] and [name] denial. He is fine with moving forward with adalimumab/Humira if this is what Dr. Dimple Casey recommends. Will obtain consent at new start visit  [name], PharmD, MPH, BCPS, CPP Clinical Pharmacist (Rheumatology and Pulmonology)

## 2023-07-14 NOTE — Telephone Encounter (Signed)
 Submitted a Prior Authorization request to [facility] for HUMIRA via CoverMyMeds. Will update once we receive a response.  Key: [id number]

## 2023-07-17 NOTE — Telephone Encounter (Signed)
 Received fax from [facility] requesting clinical notes showing patient has tried at least TWO NSAIDs. Appears patient has taken meloxicam and ibuprofen in past (through sports medicine clinic and rheumatology). Notes faxed  [name], PharmD, MPH, BCPS, CPP Clinical Pharmacist (Rheumatology and Pulmonology)

## 2023-07-21 ENCOUNTER — Telehealth: Payer: Self-pay | Admitting: *Deleted

## 2023-07-21 ENCOUNTER — Ambulatory Visit: Attending: Internal Medicine | Admitting: Physical Therapy

## 2023-07-21 ENCOUNTER — Encounter: Payer: Self-pay | Admitting: Physical Therapy

## 2023-07-21 ENCOUNTER — Other Ambulatory Visit: Payer: Self-pay

## 2023-07-21 DIAGNOSIS — G8929 Other chronic pain: Secondary | ICD-10-CM | POA: Insufficient documentation

## 2023-07-21 DIAGNOSIS — M5459 Other low back pain: Secondary | ICD-10-CM | POA: Insufficient documentation

## 2023-07-21 DIAGNOSIS — M545 Low back pain, unspecified: Secondary | ICD-10-CM | POA: Diagnosis not present

## 2023-07-21 DIAGNOSIS — M6281 Muscle weakness (generalized): Secondary | ICD-10-CM | POA: Insufficient documentation

## 2023-07-21 DIAGNOSIS — M455 Ankylosing spondylitis of thoracolumbar region: Secondary | ICD-10-CM | POA: Diagnosis not present

## 2023-07-21 NOTE — Patient Instructions (Signed)
 Access Code: [email or web address] URL: [email or web address] Date: 07/21/2023 Prepared by: Rosana Hoes  Exercises - Seated Hamstring Stretch  - 2 x daily - 3 reps - 30 seconds hold - Supine Lower Trunk Rotation  - 2 x daily - 10 reps - 5 seconds hold - Bridge  - 1 x daily - 2 sets - 10 reps - 5 seconds hold - Clam with Resistance  - 1 x daily - 2 sets - 15 reps

## 2023-07-21 NOTE — Telephone Encounter (Signed)
 Patient contacted the office and states he is having issues with his back. Patient states he was at work on Friday and had shooting pain in his back on the Left side. Patient states it felt like a spasm and it was going down his leg. Patient states it was relieved by Tylenol, Ibuprofen and Flexeril. Patient states it happened again on Saturday but was worse and he had trouble working.Patient states that sitting makes it worse. Patient states he would like to know if he should make a follow up here or see Ortho or what he needs to do. Please advise.

## 2023-07-21 NOTE — Therapy (Signed)
 OUTPATIENT PHYSICAL THERAPY EVALUATION   Patient Name: Alexis Norris MRN: [id number] DOB:21-Jun-1960, 63 y.o., male Today's Date: 07/22/2023   END OF SESSION:  PT End of Session - 07/21/23 1657     Visit Number 1    Number of Visits 17    Date for PT Re-Evaluation 09/15/23    Authorization Type [employer]    PT Start Time 1402    PT Stop Time 1445    PT Time Calculation (min) 43 min    Activity Tolerance Patient tolerated treatment well    Behavior During Therapy [facility] for tasks assessed/performed             Past Medical History:  Diagnosis Date   Arthritis    DYSLIPIDEMIA 01/13/2007   no medicines    HYPERTENSION 01/13/2007   HYPOTHYROIDISM, 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   Lumbar back pain    Wrist clicking    Past Surgical History:  Procedure Laterality Date   COLONOSCOPY     I-131 therapy  09/27/1997   maybe 2000-2001   INGUINAL HERNIA REPAIR Right 09/28/2013   Procedure: LAPAROSCOPIC RIGHT  INGUINAL HERNIA;  Surgeon: Axel Filler, MD;  Location: [facility] OR;  Service: General;  Laterality: Right;   INSERTION OF MESH Right 09/28/2013   Procedure: INSERTION OF MESH;  Surgeon: Axel Filler, MD;  Location: [facility] OR;  Service: General;  Laterality: Right;   right knuckle     pin placed in high school   Patient Active Problem List   Diagnosis Date Noted   Bilateral hip pain 01/01/2023   Ankylosing spondylitis (HCC) 11/26/2022   Low back pain 08/01/2022   Vitamin D deficiency 08/01/2022   Diverticulosis 04/17/2022   GERD (gastroesophageal reflux disease) 04/17/2022   Erectile dysfunction 04/11/2021   Diffuse cutaneous mucinosis 04/10/2020   Gout 02/06/2016   High risk medication use 08/26/2011   Allergic rhinitis 08/26/2011   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   Dyslipidemia 01/13/2007   Essential hypertension 01/13/2007    PCP: Ardith Dark, MD  REFERRING PROVIDER: Fuller Plan, MD  REFERRING DIAG: Ankylosing spondylitis of  thoracolumbar region; Chronic bilateral low back pain, unspecified whether sciatica present  Rationale for Evaluation and Treatment: Rehabilitation  THERAPY DIAG:  Other low back pain  Muscle weakness (generalized)  ONSET DATE: Chronic   SUBJECTIVE:           SUBJECTIVE STATEMENT: Patient reports left lower back pain that he states this past Friday he started having more severe pain and he took a muscle relaxer which did take the pain away. Typically he could get through the day with the pain but over the past few days the pain has gotten much more severe and feels like when he is sitting it is compressing his lower and then when he gets up the pain is worse. The pain is sharp on the left side and occasionally he feel something radiating down the left leg. 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   PERTINENT HISTORY:  See PMH above  PAIN:  Are you having pain? Yes:  NPRS scale: 2/10 at rest, 6-8/10 at worst Pain location: Left lower back Pain description: Sharp Aggravating factors: Sitting and getting up from a seated position, if he I takes a wrong step or moves too fast Relieving factors: Medication  PRECAUTIONS: None  RED FLAGS: None   WEIGHT BEARING RESTRICTIONS: No  FALLS:  Has patient fallen in last 6 months? 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 PATIENT SURVEYS:  Modified Oswestry 19/50 (38% disability)   COGNITION: Overall cognitive status: Within functional limits for tasks assessed     SENSATION: WFL  MUSCLE LENGTH: Hamstring tightness bilaterally  POSTURE:   Reduced lumbar lordosis, thoracic kyphosis, rounded shoulders  PALPATION: Tender to palpation left lumbar paraspinals and QL  LUMBAR ROM:   AROM eval  Flexion 25%  Extension < 25%  Right lateral flexion 25%  Left lateral flexion 25%   Right rotation 25%  Left rotation 25%   (Blank rows = not tested)  LOWER EXTREMITY ROM:      Hip ROM grossly WFL  LOWER EXTREMITY MMT:    MMT Right eval Left eval  Hip flexion 4 4-  Hip extension 4- 3  Hip abduction 4 4-  Hip adduction    Hip internal rotation    Hip external rotation    Knee flexion 5 4+  Knee extension 5 4+  Ankle dorsiflexion    Ankle plantarflexion    Ankle inversion    Ankle eversion     (Blank rows = not tested)  LUMBAR SPECIAL TESTS:  Lumbar radicular testing negative  FUNCTIONAL TESTS:  Sit to stand: patient demonstrates slow and hesitant movement when attempting to stand due to expectation of onset of left lower back pain, use of UE for support  GAIT: Assistive device utilized: None Level of assistance: Complete Independence Comments: Grossly WFL   TREATMENT [facility] Adult PT Treatment:                                                DATE: 07/21/2023 LTR Bridge Side clamshell with blue Seated hamstring stretch  Trigger Point Dry Needling  Initial Treatment: Pt instructed on Dry Needling rational, procedures, and possible side effects. Pt instructed to expect mild to moderate muscle soreness later in the day and/or into the next day.  Pt instructed in methods to reduce muscle soreness. Pt instructed to continue prescribed HEP. Because Dry Needling was performed over or adjacent to a lung field, pt was educated on S/S of pneumothorax and to seek immediate medical attention should they occur.  Patient was educated on signs and symptoms of infection and other risk factors and advised to seek medical attention should they occur.  Patient verbalized understanding of these instructions and education.   Patient Verbal Consent Given: Yes Education Handout Provided: Yes Muscles Treated: Left QL Electrical Stimulation Performed: No Treatment Response/Outcome: Patient report no change in symptoms   PATIENT EDUCATION:  Education details: Exam  findings, POC, HEP, TPDN Person educated: Patient Education method: Explanation, Demonstration, Tactile cues, Verbal cues, and Handouts Education comprehension: verbalized understanding, returned demonstration, verbal cues required, tactile cues required, and needs further education  HOME EXERCISE PROGRAM: Access Code: [email or web address]    ASSESSMENT: CLINICAL IMPRESSION: Patient is a 63 y.o. male who was seen today for physical therapy evaluation and treatment for chronic left lower back pain. He does have a history of ankylosing spondylitis and his currently symptoms are primarily located to the left lumbar region and seem to be more muscular in nature. He exhibit significant limitations with his lumbar mobility, flexibility deficits, and strength deficits of the core/hip musculature that is likely contributing to his pain and decrease in activity tolerance  that is impacting his functional ability.   OBJECTIVE IMPAIRMENTS: decreased activity tolerance, decreased ROM, decreased strength, impaired flexibility, postural dysfunction, and pain.   ACTIVITY LIMITATIONS: carrying, lifting, bending, sitting, standing, squatting, stairs, transfers, and locomotion level  PARTICIPATION LIMITATIONS: driving, community activity, and occupation  PERSONAL FACTORS: Fitness, Past/current experiences, and Time since onset of injury/illness/exacerbation are also affecting patient's functional outcome.   REHAB POTENTIAL: Good  CLINICAL DECISION MAKING: Stable/uncomplicated  EVALUATION COMPLEXITY: Low   GOALS: Goals reviewed with patient? Yes  SHORT TERM GOALS: Target date: 08/18/2023  Patient will be I with initial HEP in order to progress with therapy. Baseline: HEP provided at eval Goal status: INITIAL  2.  Patient will report lower back pain </= 4/10 with activity in order to reduce functional limitations and return to work Baseline: 6-8/10 pain Goal status: INITIAL  3.  Patient will be able to  perform sit to stand without hesitancy and good control in order to improve transfers with getting in and out of his work vehicle Baseline: patient with slow and hesitant movement and use of UE for support Goal status: INITIAL  LONG TERM GOALS: Target date: 09/15/2023  Patient will be I with final HEP to maintain progress from PT. Baseline: HEP provided at eval Goal status: INITIAL  2.  Patient will report modified oswestry </= 10/50 (20% disability) in order to indicate an improvement in functional status Baseline: 19/50 (38% disability)  Goal status: INITIAL  3.  Patient will demonstrate gross core and hip strength >/= 4/5 MMT in order to improve his activity tolerance with work related tasks Baseline: see limitations above Goal status: INITIAL  4.  Patient will report lower back pain </= 2/10 with all work related activities and driving in order to reduce functional limitations Baseline: 6-8/10 pain Goal status: INITIAL   PLAN: PT FREQUENCY: 1-2x/week  PT DURATION: 8 weeks  PLANNED INTERVENTIONS: 97164- PT Re-evaluation, 97110-Therapeutic exercises, 97530- Therapeutic activity, 97112- Neuromuscular re-education, 97535- Self Care, 16109- Manual therapy, [id number]- Aquatic Therapy, G0283- Electrical stimulation (unattended), [phone]- Electrical stimulation (manual), Patient/Family education, Taping, Dry Needling, Joint mobilization, Joint manipulation, Spinal manipulation, Spinal mobilization, Cryotherapy, and Moist heat.  PLAN FOR NEXT SESSION: Review HEP and progress PRN, continued with manual/TPDN for left lumbar paraspinals and QL, progress core stabilization and hip strengthening, sit to stand and lifting mechanics/progression   Rosana Hoes, PT, DPT, LAT, ATC 07/22/23  10:12 AM Phone: [phone] Fax: [phone]

## 2023-07-23 ENCOUNTER — Telehealth: Payer: Self-pay | Admitting: Sports Medicine

## 2023-07-23 NOTE — Telephone Encounter (Signed)
 From the description could range from a muscle strain to a bulged disc, usually inflammatory back pain would not be so localized and acute. I think he would benefit most to see his ortho/sportsmed clinic. If he needs any intervention like an injection or PT they could do this for him faster.

## 2023-07-23 NOTE — Telephone Encounter (Signed)
 Patient called and stated he saw rheumatologist that Dr. Jean Rosenthal referred him to. Patient states that his lower back pain has really flared up and he is having muscle spasms bad. Patient states that Dr.Rice sent him to PT and he went Monday and goes back this Monday.  Patient states that the pain is so bad he does not think he can go back to work tomorrow. His left side really hurts when he moves. He wants to know if he should be out of work . Patient also states that if he lays down it it not that bad but sitting it hurts really bad and at his job he is sitting all the time. He is asking if he should come see Dr. Jean Rosenthal. He states that he took muscle relaxer and it helped but the pain came back and that he has taken Advil with tylenol but the pain comes back as well.   Please advise.

## 2023-07-24 ENCOUNTER — Ambulatory Visit: Admitting: Sports Medicine

## 2023-07-24 VITALS — HR 104 | Ht 74.0 in | Wt 287.0 lb

## 2023-07-24 DIAGNOSIS — M455 Ankylosing spondylitis of thoracolumbar region: Secondary | ICD-10-CM

## 2023-07-24 DIAGNOSIS — M545 Low back pain, unspecified: Secondary | ICD-10-CM

## 2023-07-24 DIAGNOSIS — G8929 Other chronic pain: Secondary | ICD-10-CM | POA: Diagnosis not present

## 2023-07-24 DIAGNOSIS — Z1589 Genetic susceptibility to other disease: Secondary | ICD-10-CM | POA: Diagnosis not present

## 2023-07-24 MED ORDER — MELOXICAM 15 MG PO TABS: 15.0000 mg | ORAL_TABLET | Freq: Every day | ORAL | 0 refills | Status: DC

## 2023-07-24 MED ORDER — CYCLOBENZAPRINE HCL 5 MG PO TABS: 5.0000 mg | ORAL_TABLET | Freq: Every day | ORAL | 0 refills | Status: DC

## 2023-07-24 NOTE — Patient Instructions (Addendum)
-   Start meloxicam 15 mg daily x2 weeks.  If still having pain after 2 weeks, complete 3rd-week of NSAID. May use remaining NSAID as needed once daily for pain control.  Do not to use additional over-the-counter NSAIDs (ibuprofen, naproxen, Advil, Aleve, etc.) while taking prescription NSAIDs.  May use Tylenol [phone] mg 2 to 3 times a day for breakthrough pain. Flexeril 5-10 mg nightly as needed for muscle spasm  Work note provided out until 07/31/2023 2 week follow up

## 2023-07-24 NOTE — Telephone Encounter (Signed)
 Patient advised from the description could range from a muscle strain to a bulged disc, usually inflammatory back pain would not be so localized and acute. Dr. Dimple Casey thinks he would benefit most to see his ortho/sportsmed clinic. If he needs any intervention like an injection or PT they could do this for him faster.  Patient states he has an appointment with his orthopedic this morning.

## 2023-07-24 NOTE — Progress Notes (Signed)
 Alexis Norris D.[facility] [address] [place] [address] Phone: [phone]   Assessment and Plan:     1. Ankylosing spondylitis of thoracolumbar region (HCC) 2. HLA B27 positive 3. Chronic bilateral low back pain without sciatica -Chronic with exacerbation, subsequent visit - Recurrent flare of low back pain.  Current low back flare is most consistent with left lumbar musculature strain likely due to physical activity.  Patient does have past medical history of ankylosing spondylitis with positive HLA-B27 and follows with rheumatology, and is currently awaiting prior authorization for medication, however physical exam and HPI are more consistent with muscular strain. - We will treat current flare as a muscular strain and if no improvement in 2 weeks, would consider lumbar MRI - Start meloxicam 15 mg daily x2 weeks.  If still having pain after 2 weeks, complete 3rd-week of NSAID. May use remaining NSAID as needed once daily for pain control.  Do not to use additional over-the-counter NSAIDs (ibuprofen, naproxen, Advil, Aleve, etc.) while taking prescription NSAIDs.  May use Tylenol [phone] mg 2 to 3 times a day for breakthrough pain. - Start Flexeril 5 to 10 mg nightly as needed for muscle spasms - Continue physical therapy as tolerated - Recommend out of work until next Tuesday, 07/29/2023, and can return to work without restrictions if tolerated.  Work note provided   Pertinent previous records reviewed include rheumatology telephone encounters 07/08/2023, 07/09/2023, 07/14/2023, 07/17/2023, rheumatology note 06/25/23, physical therapy note 07/21/23  Follow Up: 2 weeks for reevaluation.  If no improvement or worsening of symptoms, would obtain lumbar MRI   Subjective:   I, Alexis Norris, am serving as a Neurosurgeon for Doctor Richardean Sale   Chief Complaint: low back pain    HPI:    07/03/2022 Patient is a 63 year old male complaining of low back  pain. Patient states that pain started in December , felt like his lower back would spasm, pain has progressively gotten worst, advil helps, pain is radiating down his hamstring and butt, he feels the pain in his spine, no numbness or tingling, intermittent pain when he walks ,    07/24/2022 Patient states doing better than before. Medicine started helping about 3-4 days in. After 2 weeks tried to stop for 2-3 days and pain came back but not as intense. Takes medication when working. Can still feel discomfort in back, but intensity of pain has definitely decreased   08/28/2022 Patient states Pt has been working, has soreness and good day and bad days but spams have decreased    07/25/2023 Patient states phone note 07/23/2023 Patient states that his lower back pain has really flared up and he is having muscle spasms bad. Patient states that Dr.Rice sent him to PT and he went Monday and goes back this Monday.  Patient states that the pain is so bad he does not think he can go back to work tomorrow. His left side really hurts when he moves. He wants to know if he should be out of work . Patient also states that if he lays down it it not that bad but sitting it hurts really bad and at his job he is sitting all the time. He is asking if he should come see Dr. Jean Rosenthal. He states that he took muscle relaxer and it helped but the pain came back and that he has taken Advil with tylenol but the pain comes back as well.   Pain came back last  week. Really bad flare isnt able to sit. Flexeril helps a little    Relevant Historical Information: Hypertension, GERD, hypothyroidism   Additional pertinent review of systems negative.   Current Outpatient Medications:    Colchicine 0.6 MG CAPS, Day 1: Take 2 caps, then 1 cap an hour later. Day 2 and beyond: 1 cap daily, Disp: 30 capsule, Rfl: 0   cyclobenzaprine (FLEXERIL) 5 MG tablet, Take 1 tablet (5 mg total) by mouth at bedtime., Disp: 30 tablet, Rfl: 0    hydrocortisone (ANUSOL-HC) 2.5 % rectal cream, Place 1 Application rectally 2 (two) times daily., Disp: 30 g, Rfl: 1   levothyroxine (SYNTHROID) 125 MCG tablet, TAKE 2 TABLETS DAILY BEFOREBREAKFAST (Patient taking differently: TAKE 2 TABLETS DAILY BEFORE BREAKFAST Monday-Saturday, 3 tablets daily on sunday), Disp: 180 tablet, Rfl: 0   losartan (COZAAR) 100 MG tablet, Take 1 tablet (100 mg total) by mouth daily., Disp: 100 tablet, Rfl: 3   meloxicam (MOBIC) 15 MG tablet, Take 1 tablet (15 mg total) by mouth daily., Disp: 30 tablet, Rfl: 0   meloxicam (MOBIC) 15 MG tablet, Take 1 tablet (15 mg total) by mouth daily., Disp: 30 tablet, Rfl: 0   meloxicam (MOBIC) 15 MG tablet, Take 1 tablet (15 mg total) by mouth daily., Disp: 30 tablet, Rfl: 0   predniSONE (DELTASONE) 5 MG tablet, Take 5 mg by mouth daily., Disp: , Rfl:    SODIUM FLUORIDE 5000 ENAMEL 1.1-5 % GEL, See admin instructions., Disp: , Rfl:    sulfaSALAzine (AZULFIDINE) 500 MG tablet, Take 2 tablets (1,000 mg total) by mouth 2 (two) times daily., Disp: 360 tablet, Rfl: 0   tadalafil (CIALIS) 10 MG tablet, Take 1 tablet (10 mg total) by mouth daily., Disp: 90 tablet, Rfl: 1   Vitamin D, Ergocalciferol, (DRISDOL) 1.25 MG (50000 UNIT) CAPS capsule, Take 1 capsule (50,000 Units total) by mouth every 7 (seven) days., Disp: 12 capsule, Rfl: 0  Current Facility-Administered Medications:    0.9 %  sodium chloride infusion, 500 mL, Intravenous, Once, Danis, Starr Lake III, MD   Objective:     Vitals:   07/24/23 1101  Pulse: (!) 104  SpO2: 100%  Weight: 287 lb (130.2 kg)  Height: 6\' 2"  (1.88 m)      Body mass index is 36.85 kg/m.    Physical Exam:    Gen: Appears well, nad, nontoxic and pleasant Psych: Alert and oriented, appropriate mood and affect Neuro: sensation intact, strength is 5/5 in upper and lower extremities, muscle tone wnl Skin: no susupicious lesions or rashes  Back - Normal skin, Spine with normal alignment and no  deformity.   No tenderness to vertebral process palpation.   Left lumbar paraspinous muscles are   tender and without spasm NTTP gluteal musculature Straight leg raise negative Trendelenberg negative Gait antalgic.  Walking upright with slow and short steps Left lower back pain with resisted lumbar rotation, flexion, extension  Electronically signed by:  Alexis Norris D.[facility] 11:48 AM 07/24/23

## 2023-07-28 ENCOUNTER — Ambulatory Visit

## 2023-07-28 DIAGNOSIS — M5459 Other low back pain: Secondary | ICD-10-CM

## 2023-07-28 DIAGNOSIS — M6281 Muscle weakness (generalized): Secondary | ICD-10-CM

## 2023-07-28 NOTE — Therapy (Signed)
 OUTPATIENT PHYSICAL THERAPY TREATMENT   Patient Name: Alexis Norris MRN: [id number] DOB:06/22/1960, 63 y.o., male Today's Date: 07/28/2023   END OF SESSION:  PT End of Session - 07/28/23 1614     Visit Number 2    Number of Visits 17    Date for PT Re-Evaluation 09/15/23    Authorization Type [employer]    PT Start Time 1615    PT Stop Time 1655    PT Time Calculation (min) 40 min    Activity Tolerance Patient tolerated treatment well    Behavior During Therapy [facility] for tasks assessed/performed              Past Medical History:  Diagnosis Date   Arthritis    DYSLIPIDEMIA 01/13/2007   no medicines    HYPERTENSION 01/13/2007   HYPOTHYROIDISM, 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   Lumbar back pain    Wrist clicking    Past Surgical History:  Procedure Laterality Date   COLONOSCOPY     I-131 therapy  09/27/1997   maybe 2000-2001   INGUINAL HERNIA REPAIR Right 09/28/2013   Procedure: LAPAROSCOPIC RIGHT  INGUINAL HERNIA;  Surgeon: Axel Filler, MD;  Location: [facility] OR;  Service: General;  Laterality: Right;   INSERTION OF MESH Right 09/28/2013   Procedure: INSERTION OF MESH;  Surgeon: Axel Filler, MD;  Location: [facility] OR;  Service: General;  Laterality: Right;   right knuckle     pin placed in high school   Patient Active Problem List   Diagnosis Date Noted   Bilateral hip pain 01/01/2023   Ankylosing spondylitis (HCC) 11/26/2022   Low back pain 08/01/2022   Vitamin D deficiency 08/01/2022   Diverticulosis 04/17/2022   GERD (gastroesophageal reflux disease) 04/17/2022   Erectile dysfunction 04/11/2021   Diffuse cutaneous mucinosis 04/10/2020   Gout 02/06/2016   High risk medication use 08/26/2011   Allergic rhinitis 08/26/2011   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 Changed medications and notes this is helping, was having more pain last week.   EVAL: Patient reports left lower back pain that he states this past Friday he started having more severe pain and he took a muscle relaxer which did take the pain away. Typically he could get through the day with the pain but over the past few days the pain has gotten much more severe and feels like when he is sitting it is compressing his lower and then when he gets up the pain is worse. The pain is sharp on the left side and occasionally he feel something radiating down the left leg. 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   PERTINENT HISTORY:  See PMH above  PAIN:  Are you having pain? Yes:  NPRS scale: 2/10 at rest, 6-8/10 at worst Pain location: Left lower back Pain description: Sharp Aggravating factors: Sitting and getting up from a seated position, if he I takes a wrong step or moves too fast  Relieving factors: Medication  PRECAUTIONS: None  RED FLAGS: None   WEIGHT BEARING RESTRICTIONS: No  FALLS:  Has patient fallen in last 6 months? 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 PATIENT SURVEYS:  Modified Oswestry 19/50 (38% disability)   COGNITION: Overall cognitive status: Within functional limits for tasks assessed     SENSATION: WFL  MUSCLE LENGTH: Hamstring tightness bilaterally  POSTURE:   Reduced lumbar lordosis, thoracic kyphosis, rounded  shoulders  PALPATION: Tender to palpation left lumbar paraspinals and QL  LUMBAR ROM:   AROM eval  Flexion 25%  Extension < 25%  Right lateral flexion 25%  Left lateral flexion 25%  Right rotation 25%  Left rotation 25%   (Blank rows = not tested)  LOWER EXTREMITY ROM:      Hip ROM grossly WFL  LOWER EXTREMITY MMT:    MMT Right eval Left eval  Hip flexion 4 4-  Hip extension 4- 3  Hip abduction 4 4-  Hip adduction    Hip internal rotation    Hip external rotation    Knee flexion 5 4+  Knee extension 5 4+  Ankle dorsiflexion    Ankle plantarflexion    Ankle inversion    Ankle eversion     (Blank rows = not tested)  LUMBAR SPECIAL TESTS:  Lumbar radicular testing negative  FUNCTIONAL TESTS:  Sit to stand: patient demonstrates slow and hesitant movement when attempting to stand due to expectation of onset of left lower back pain, use of UE for support  GAIT: Assistive device utilized: None Level of assistance: Complete Independence Comments: Grossly WFL   TREATMENT [facility] Adult PT Treatment:                                                DATE: 07/28/2023 NuStep lvl 5 UE/LE x 5 min while taking subjective LTR x 10 Bridge 2x10 Side clamshell with blue 2x10 L Supine fig 4 stretch 2x30" each Lateral walk YTB x 3 laps at counter Manual Therapy: Skilled palpation of trigger points for TPDN TPR to L QL  STM to L QL and lumbar paraspinals Trigger Point Dry Needling  Initial Treatment: Pt instructed on Dry Needling rational, procedures, and possible side effects. Pt instructed to expect mild to moderate muscle soreness later in the day and/or into the next day.  Pt instructed in methods to reduce muscle soreness. Pt instructed to continue prescribed HEP. Because Dry Needling was performed over or adjacent to a lung field, pt was educated on S/S of pneumothorax and to seek immediate medical attention should they occur.  Patient was educated on signs and symptoms  of infection and other risk factors and advised to seek medical attention should they occur.  Patient verbalized understanding of these instructions and education.   Patient Verbal Consent Given: Yes Education Handout Provided: Yes Muscles Treated: Left QL Electrical Stimulation Performed: No Treatment Response/Outcome: Patient report no change in symptoms   PATIENT EDUCATION:  Education details: Exam findings, POC, HEP, TPDN Person educated: Patient Education method: Explanation, Demonstration, Tactile cues, Verbal cues, and Handouts Education comprehension: verbalized understanding, returned demonstration, verbal cues required, tactile cues required, and needs further education  HOME EXERCISE PROGRAM: Access Code: [email or web address]    ASSESSMENT: CLINICAL  IMPRESSION: Pt was able to complete prescribed exercises with no adverse effect. Exercises today focused on improving lumbar mobility and lateral hip strength in order to decrease pain. Responded well to manual therapy and TPDN. Continues to benefit from skilled therapy, will continue to progress as able.   EVAL: Patient is a 63 y.o. male who was seen today for physical therapy evaluation and treatment for chronic left lower back pain. He does have a history of ankylosing spondylitis and his currently symptoms are primarily located to the left lumbar region and seem to be more muscular in nature. He exhibit significant limitations with his lumbar mobility, flexibility deficits, and strength deficits of the core/hip musculature that is likely contributing to his pain and decrease in activity tolerance that is impacting his functional ability.   OBJECTIVE IMPAIRMENTS: decreased activity tolerance, decreased ROM, decreased strength, impaired flexibility, postural dysfunction, and pain.   ACTIVITY LIMITATIONS: carrying, lifting, bending, sitting, standing, squatting, stairs, transfers, and locomotion level  PARTICIPATION LIMITATIONS: driving,  community activity, and occupation  PERSONAL FACTORS: Fitness, Past/current experiences, and Time since onset of injury/illness/exacerbation are also affecting patient's functional outcome.   REHAB POTENTIAL: Good  CLINICAL DECISION MAKING: Stable/uncomplicated  EVALUATION COMPLEXITY: Low   GOALS: Goals reviewed with patient? Yes  SHORT TERM GOALS: Target date: 08/18/2023  Patient will be I with initial HEP in order to progress with therapy. Baseline: HEP provided at eval Goal status: INITIAL  2.  Patient will report lower back pain </= 4/10 with activity in order to reduce functional limitations and return to work Baseline: 6-8/10 pain Goal status: INITIAL  3.  Patient will be able to perform sit to stand without hesitancy and good control in order to improve transfers with getting in and out of his work vehicle Baseline: patient with slow and hesitant movement and use of UE for support Goal status: INITIAL  LONG TERM GOALS: Target date: 09/15/2023  Patient will be I with final HEP to maintain progress from PT. Baseline: HEP provided at eval Goal status: INITIAL  2.  Patient will report modified oswestry </= 10/50 (20% disability) in order to indicate an improvement in functional status Baseline: 19/50 (38% disability)  Goal status: INITIAL  3.  Patient will demonstrate gross core and hip strength >/= 4/5 MMT in order to improve his activity tolerance with work related tasks Baseline: see limitations above Goal status: INITIAL  4.  Patient will report lower back pain </= 2/10 with all work related activities and driving in order to reduce functional limitations Baseline: 6-8/10 pain Goal status: INITIAL   PLAN: PT FREQUENCY: 1-2x/week  PT DURATION: 8 weeks  PLANNED INTERVENTIONS: 97164- PT Re-evaluation, 97110-Therapeutic exercises, 97530- Therapeutic activity, 97112- Neuromuscular re-education, 97535- Self Care, 46962- Manual therapy, [id number]- Aquatic Therapy, G0283-  Electrical stimulation (unattended), [phone]- Electrical stimulation (manual), Patient/Family education, Taping, Dry Needling, Joint mobilization, Joint manipulation, Spinal manipulation, Spinal mobilization, Cryotherapy, and Moist heat.  PLAN FOR NEXT SESSION: Review HEP and progress PRN, continued with manual/TPDN for left lumbar paraspinals and QL, progress core stabilization and hip strengthening, sit to stand and lifting mechanics/progression   Eloy End PT  07/28/23 5:01 PM

## 2023-07-30 ENCOUNTER — Ambulatory Visit: Attending: Internal Medicine

## 2023-07-30 DIAGNOSIS — M5459 Other low back pain: Secondary | ICD-10-CM

## 2023-07-30 DIAGNOSIS — M6281 Muscle weakness (generalized): Secondary | ICD-10-CM | POA: Diagnosis present

## 2023-07-30 NOTE — Therapy (Signed)
 OUTPATIENT PHYSICAL THERAPY TREATMENT   Patient Name: Alexis Norris MRN: [id number] DOB:[birth year], 63 y.o., male Today's Date: 07/30/2023   END OF SESSION:  PT End of Session - 07/30/23 1141     Visit Number 3    Number of Visits 17    Date for PT Re-Evaluation 09/15/23    Authorization Type [employer]    PT Start Time 1145    PT Stop Time 1225    PT Time Calculation (min) 40 min    Activity Tolerance Patient tolerated treatment well    Behavior During Therapy [facility] for tasks assessed/performed               Past Medical History:  Diagnosis Date   Arthritis    DYSLIPIDEMIA 01/13/2007   no medicines    HYPERTENSION 01/13/2007   HYPOTHYROIDISM, 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   Lumbar back pain    Wrist clicking    Past Surgical History:  Procedure Laterality Date   COLONOSCOPY     I-131 therapy  09/27/1997   maybe 2000-2001   INGUINAL HERNIA REPAIR Right 09/28/2013   Procedure: LAPAROSCOPIC RIGHT  INGUINAL HERNIA;  Surgeon: Axel Filler, MD;  Location: [facility] OR;  Service: General;  Laterality: Right;   INSERTION OF MESH Right 09/28/2013   Procedure: INSERTION OF MESH;  Surgeon: Axel Filler, MD;  Location: [facility] OR;  Service: General;  Laterality: Right;   right knuckle     pin placed in high school   Patient Active Problem List   Diagnosis Date Noted   Bilateral hip pain 01/01/2023   Ankylosing spondylitis (HCC) 11/26/2022   Low back pain 08/01/2022   Vitamin D deficiency 08/01/2022   Diverticulosis 04/17/2022   GERD (gastroesophageal reflux disease) 04/17/2022   Erectile dysfunction 04/11/2021   Diffuse cutaneous mucinosis 04/10/2020   Gout 02/06/2016   High risk medication use 08/26/2011   Allergic rhinitis 08/26/2011   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 Returned to work yesterday and notes that it went well.   EVAL: Patient reports left lower back pain that he states this past Friday he started having more severe pain and he took a muscle relaxer which did take the pain away. Typically he could get through the day with the pain but over the past few days the pain has gotten much more severe and feels like when he is sitting it is compressing his lower and then when he gets up the pain is worse. The pain is sharp on the left side and occasionally he feel something radiating down the left leg. 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   PERTINENT HISTORY:  See PMH above  PAIN:  Are you having pain? Yes:  NPRS scale: 2/10 at rest, 6-8/10 at worst Pain location: Left lower back Pain description: Sharp Aggravating factors: Sitting and getting up from a seated position, if he I takes a wrong step or moves too fast Relieving factors: Medication  PRECAUTIONS: None  RED FLAGS: None   WEIGHT BEARING RESTRICTIONS: No  FALLS:  Has patient fallen in last 6 months? 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 PATIENT SURVEYS:  Modified Oswestry 19/50 (38% disability)   COGNITION: Overall cognitive status: Within functional limits for tasks assessed     SENSATION: WFL  MUSCLE LENGTH: Hamstring tightness bilaterally  POSTURE:   Reduced lumbar lordosis, thoracic kyphosis, rounded shoulders  PALPATION: Tender to palpation left  lumbar paraspinals and QL  LUMBAR ROM:   AROM eval  Flexion 25%  Extension < 25%  Right lateral flexion 25%  Left lateral flexion 25%  Right rotation 25%  Left rotation 25%   (Blank rows = not tested)  LOWER EXTREMITY ROM:      Hip ROM grossly WFL  LOWER EXTREMITY MMT:    MMT Right eval Left eval  Hip flexion 4 4-  Hip extension 4- 3  Hip abduction 4 4-  Hip adduction    Hip internal rotation    Hip external rotation    Knee flexion 5 4+  Knee extension 5 4+  Ankle dorsiflexion    Ankle plantarflexion    Ankle inversion    Ankle eversion     (Blank rows = not tested)  LUMBAR SPECIAL TESTS:  Lumbar radicular testing negative  FUNCTIONAL TESTS:  Sit to stand: patient demonstrates slow and hesitant movement when attempting to stand due to expectation of onset of left lower back pain, use of UE for support  GAIT: Assistive device utilized: None Level of assistance: Complete Independence Comments: Grossly WFL   TREATMENT [facility] Adult PT Treatment:                                                DATE: 07/30/2023 NuStep lvl 5 UE/LE x 5 min while taking subjective Seated bilateral ER 2x10 RTB LTR x 10 Bridge 3x10 Hooklying clamshell 2x15 GTB Hooklying bent knee fallout x 10 GTB each Manual Therapy: Skilled palpation of trigger points for TPDN Trigger Point Dry Needling  Initial Treatment: Pt instructed on Dry Needling rational, procedures, and possible side effects. Pt instructed to expect mild to moderate muscle soreness later in the day and/or into the next day.  Pt instructed in methods to reduce muscle soreness. Pt instructed to continue prescribed HEP. Because Dry Needling was performed over or adjacent to a lung field, pt was educated on S/S of pneumothorax and to seek immediate medical attention should they occur.  Patient was educated on signs and symptoms of infection and other risk factors and advised to seek medical attention should they occur.  Patient  verbalized understanding of these instructions and education.   Patient Verbal Consent Given: Yes Education Handout Provided: Yes Muscles Treated: Left lumbar paraspinals Electrical Stimulation Performed: Yes, Parameters: Milli x 5 min low freq low intensity; then mirco x 5 min high freq high intensity Treatment Response/Outcome: Patient report no change in symptoms   PATIENT EDUCATION:  Education details: continue HEP Person educated: Patient Education method: Explanation, Demonstration, Tactile cues, Verbal cues, and Handouts Education comprehension: verbalized understanding, returned demonstration, verbal cues required, tactile cues required, and needs further education  HOME EXERCISE PROGRAM: Access Code: [email or web address]    ASSESSMENT: CLINICAL IMPRESSION: Pt was able to complete  prescribed exercises with no adverse effect. Exercises today focused on improving lumbar mobility and lateral hip strength in order to decrease pain. Responded well to TPDN and trial of ESTIM today. Continues to benefit from skilled therapy, will continue to progress as able.   EVAL: Patient is a 63 y.o. male who was seen today for physical therapy evaluation and treatment for chronic left lower back pain. He does have a history of ankylosing spondylitis and his currently symptoms are primarily located to the left lumbar region and seem to be more muscular in nature. He exhibit significant limitations with his lumbar mobility, flexibility deficits, and strength deficits of the core/hip musculature that is likely contributing to his pain and decrease in activity tolerance that is impacting his functional ability.   OBJECTIVE IMPAIRMENTS: decreased activity tolerance, decreased ROM, decreased strength, impaired flexibility, postural dysfunction, and pain.   ACTIVITY LIMITATIONS: carrying, lifting, bending, sitting, standing, squatting, stairs, transfers, and locomotion level  PARTICIPATION LIMITATIONS: driving,  community activity, and occupation  PERSONAL FACTORS: Fitness, Past/current experiences, and Time since onset of injury/illness/exacerbation are also affecting patient's functional outcome.   REHAB POTENTIAL: Good  CLINICAL DECISION MAKING: Stable/uncomplicated  EVALUATION COMPLEXITY: Low   GOALS: Goals reviewed with patient? Yes  SHORT TERM GOALS: Target date: 08/18/2023  Patient will be I with initial HEP in order to progress with therapy. Baseline: HEP provided at eval Goal status: INITIAL  2.  Patient will report lower back pain </= 4/10 with activity in order to reduce functional limitations and return to work Baseline: 6-8/10 pain Goal status: INITIAL  3.  Patient will be able to perform sit to stand without hesitancy and good control in order to improve transfers with getting in and out of his work vehicle Baseline: patient with slow and hesitant movement and use of UE for support Goal status: INITIAL  LONG TERM GOALS: Target date: 09/15/2023  Patient will be I with final HEP to maintain progress from PT. Baseline: HEP provided at eval Goal status: INITIAL  2.  Patient will report modified oswestry </= 10/50 (20% disability) in order to indicate an improvement in functional status Baseline: 19/50 (38% disability)  Goal status: INITIAL  3.  Patient will demonstrate gross core and hip strength >/= 4/5 MMT in order to improve his activity tolerance with work related tasks Baseline: see limitations above Goal status: INITIAL  4.  Patient will report lower back pain </= 2/10 with all work related activities and driving in order to reduce functional limitations Baseline: 6-8/10 pain Goal status: INITIAL   PLAN: PT FREQUENCY: 1-2x/week  PT DURATION: 8 weeks  PLANNED INTERVENTIONS: 97164- PT Re-evaluation, 97110-Therapeutic exercises, 97530- Therapeutic activity, 97112- Neuromuscular re-education, 97535- Self Care, 16109- Manual therapy, [id number]- Aquatic Therapy, G0283-  Electrical stimulation (unattended), [phone]- Electrical stimulation (manual), Patient/Family education, Taping, Dry Needling, Joint mobilization, Joint manipulation, Spinal manipulation, Spinal mobilization, Cryotherapy, and Moist heat.  PLAN FOR NEXT SESSION: Review HEP and progress PRN, continued with manual/TPDN for left lumbar paraspinals and QL, progress core stabilization and hip strengthening, sit to stand and lifting mechanics/progression   Eloy End PT  07/30/23 1:07 PM

## 2023-08-01 NOTE — Telephone Encounter (Signed)
 Received a fax regarding Prior Authorization from [facility] for  HYRIMOZ . Authorization has been DENIED because must submit medical records indicating patient has taken at least TWO NSAIDs or has an intolerance or contraindication to using at least two NSAIDs.  Submitted an URGENT appeal to [facility] for  HYRIMOZ .  [place] Case #: [id number]  Fax: [phone]

## 2023-08-04 ENCOUNTER — Ambulatory Visit

## 2023-08-04 DIAGNOSIS — M5459 Other low back pain: Secondary | ICD-10-CM

## 2023-08-04 DIAGNOSIS — M6281 Muscle weakness (generalized): Secondary | ICD-10-CM

## 2023-08-04 NOTE — Therapy (Signed)
 OUTPATIENT PHYSICAL THERAPY TREATMENT   Patient Name: Alexis Norris MRN: [id number] DOB:November 29, 1960, 63 y.o., male Today's Date: 08/05/2023   END OF SESSION:  PT End of Session - 08/04/23 1618     Visit Number 4    Number of Visits 17    Date for PT Re-Evaluation 09/15/23    Authorization Type [employer]    PT Start Time 1618    PT Stop Time 1658    PT Time Calculation (min) 40 min    Activity Tolerance Patient tolerated treatment well    Behavior During Therapy [facility] for tasks assessed/performed                Past Medical History:  Diagnosis Date   Arthritis    DYSLIPIDEMIA 01/13/2007   no medicines    HYPERTENSION 01/13/2007   HYPOTHYROIDISM, 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   Lumbar back pain    Wrist clicking    Past Surgical History:  Procedure Laterality Date   COLONOSCOPY     I-131 therapy  09/27/1997   maybe 2000-2001   INGUINAL HERNIA REPAIR Right 09/28/2013   Procedure: LAPAROSCOPIC RIGHT  INGUINAL HERNIA;  Surgeon: Axel Filler, MD;  Location: [facility] OR;  Service: General;  Laterality: Right;   INSERTION OF MESH Right 09/28/2013   Procedure: INSERTION OF MESH;  Surgeon: Axel Filler, MD;  Location: [facility] OR;  Service: General;  Laterality: Right;   right knuckle     pin placed in high school   Patient Active Problem List   Diagnosis Date Noted   Bilateral hip pain 01/01/2023   Ankylosing spondylitis (HCC) 11/26/2022   Low back pain 08/01/2022   Vitamin D deficiency 08/01/2022   Diverticulosis 04/17/2022   GERD (gastroesophageal reflux disease) 04/17/2022   Erectile dysfunction 04/11/2021   Diffuse cutaneous mucinosis 04/10/2020   Gout 02/06/2016   High risk medication use 08/26/2011   Allergic rhinitis 08/26/2011   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 Has been compliant with HEP.   EVAL: Patient reports left lower back pain that he states this past Friday he started having more severe pain and he took a muscle relaxer which did take the pain away. Typically he could get through the day with the pain but over the past few days the pain has gotten much more severe and feels like when he is sitting it is compressing his lower and then when he gets up the pain is worse. The pain is sharp on the left side and occasionally he feel something radiating down the left leg. 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   PERTINENT HISTORY:  See PMH above  PAIN:  Are you having pain? Yes:  NPRS scale: 2/10 at rest, 6-8/10 at worst Pain location: Left lower back Pain description: Sharp Aggravating factors: Sitting and getting up from a seated position, if he I takes a wrong step or moves too fast Relieving factors: Medication  PRECAUTIONS: None  RED  FLAGS: None   WEIGHT BEARING RESTRICTIONS: No  FALLS:  Has patient fallen in last 6 months? 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 PATIENT SURVEYS:  Modified Oswestry 19/50 (38% disability)   COGNITION: Overall cognitive status: Within functional limits for tasks assessed     SENSATION: WFL  MUSCLE LENGTH: Hamstring tightness bilaterally  POSTURE:   Reduced lumbar lordosis, thoracic kyphosis, rounded shoulders  PALPATION: Tender to palpation left lumbar  paraspinals and QL  LUMBAR ROM:   AROM eval  Flexion 25%  Extension < 25%  Right lateral flexion 25%  Left lateral flexion 25%  Right rotation 25%  Left rotation 25%   (Blank rows = not tested)  LOWER EXTREMITY ROM:      Hip ROM grossly WFL  LOWER EXTREMITY MMT:    MMT Right eval Left eval  Hip flexion 4 4-  Hip extension 4- 3  Hip abduction 4 4-  Hip adduction    Hip internal rotation    Hip external rotation    Knee flexion 5 4+  Knee extension 5 4+  Ankle dorsiflexion    Ankle plantarflexion    Ankle inversion    Ankle eversion     (Blank rows = not tested)  LUMBAR SPECIAL TESTS:  Lumbar radicular testing negative  FUNCTIONAL TESTS:  Sit to stand: patient demonstrates slow and hesitant movement when attempting to stand due to expectation of onset of left lower back pain, use of UE for support  GAIT: Assistive device utilized: None Level of assistance: Complete Independence Comments: Grossly WFL   TREATMENT [facility] Adult PT Treatment:                                                DATE: 08/04/2023 NuStep lvl 5 UE/LE x 5 min while taking subjective Seated bilateral ER 2x10 RTB LTR x 10 Bridge 3x10 Standing row 3x10 blue band Manual Therapy: Skilled palpation of trigger points for TPDN Trigger Point Dry Needling:  Initial Treatment: Pt instructed on Dry Needling rational, procedures, and possible side effects. Pt instructed to expect mild to moderate muscle soreness later in the day and/or into the next day.  Pt instructed in methods to reduce muscle soreness. Pt instructed to continue prescribed HEP. Because Dry Needling was performed over or adjacent to a lung field, pt was educated on S/S of pneumothorax and to seek immediate medical attention should they occur.  Patient was educated on signs and symptoms of infection and other risk factors and advised to seek medical attention should they occur.  Patient verbalized understanding of these instructions and  education.   Patient Verbal Consent Given: Yes Education Handout Provided: Yes Muscles Treated: Left lumbar paraspinals Electrical Stimulation Performed: Yes, Parameters: Milli x 5 min low freq low intensity; then mirco x 5 min high freq high intensity Treatment Response/Outcome: Patient report no change in symptoms   PATIENT EDUCATION:  Education details: continue HEP Person educated: Patient Education method: Explanation, Demonstration, Tactile cues, Verbal cues, and Handouts Education comprehension: verbalized understanding, returned demonstration, verbal cues required, tactile cues required, and needs further education  HOME EXERCISE PROGRAM: Access Code: [email or web address]    ASSESSMENT: CLINICAL IMPRESSION: Pt was able to complete prescribed exercises with no adverse effect. Exercises today  focused on improving lumbar mobility and lateral hip strength in order to decrease pain. HEP updated for periscapular strengthening addition. Responded well to TPDN and continued use of ESTIM today. Continues to benefit from skilled therapy, will continue to progress as able.   EVAL: Patient is a 63 y.o. male who was seen today for physical therapy evaluation and treatment for chronic left lower back pain. He does have a history of ankylosing spondylitis and his currently symptoms are primarily located to the left lumbar region and seem to be more muscular in nature. He exhibit significant limitations with his lumbar mobility, flexibility deficits, and strength deficits of the core/hip musculature that is likely contributing to his pain and decrease in activity tolerance that is impacting his functional ability.   OBJECTIVE IMPAIRMENTS: decreased activity tolerance, decreased ROM, decreased strength, impaired flexibility, postural dysfunction, and pain.   ACTIVITY LIMITATIONS: carrying, lifting, bending, sitting, standing, squatting, stairs, transfers, and locomotion level  PARTICIPATION LIMITATIONS:  driving, community activity, and occupation  PERSONAL FACTORS: Fitness, Past/current experiences, and Time since onset of injury/illness/exacerbation are also affecting patient's functional outcome.   REHAB POTENTIAL: Good  CLINICAL DECISION MAKING: Stable/uncomplicated  EVALUATION COMPLEXITY: Low   GOALS: Goals reviewed with patient? Yes  SHORT TERM GOALS: Target date: 08/18/2023  Patient will be I with initial HEP in order to progress with therapy. Baseline: HEP provided at eval Goal status: INITIAL  2.  Patient will report lower back pain </= 4/10 with activity in order to reduce functional limitations and return to work Baseline: 6-8/10 pain Goal status: INITIAL  3.  Patient will be able to perform sit to stand without hesitancy and good control in order to improve transfers with getting in and out of his work vehicle Baseline: patient with slow and hesitant movement and use of UE for support Goal status: INITIAL  LONG TERM GOALS: Target date: 09/15/2023  Patient will be I with final HEP to maintain progress from PT. Baseline: HEP provided at eval Goal status: INITIAL  2.  Patient will report modified oswestry </= 10/50 (20% disability) in order to indicate an improvement in functional status Baseline: 19/50 (38% disability)  Goal status: INITIAL  3.  Patient will demonstrate gross core and hip strength >/= 4/5 MMT in order to improve his activity tolerance with work related tasks Baseline: see limitations above Goal status: INITIAL  4.  Patient will report lower back pain </= 2/10 with all work related activities and driving in order to reduce functional limitations Baseline: 6-8/10 pain Goal status: INITIAL   PLAN: PT FREQUENCY: 1-2x/week  PT DURATION: 8 weeks  PLANNED INTERVENTIONS: 97164- PT Re-evaluation, 97110-Therapeutic exercises, 97530- Therapeutic activity, 97112- Neuromuscular re-education, 97535- Self Care, 40981- Manual therapy, [id number]- Aquatic  Therapy, G0283- Electrical stimulation (unattended), [phone]- Electrical stimulation (manual), Patient/Family education, Taping, Dry Needling, Joint mobilization, Joint manipulation, Spinal manipulation, Spinal mobilization, Cryotherapy, and Moist heat.  PLAN FOR NEXT SESSION: Review HEP and progress PRN, continued with manual/TPDN for left lumbar paraspinals and QL, progress core stabilization and hip strengthening, sit to stand and lifting mechanics/progression   Eloy End PT  08/05/23 8:39 AM

## 2023-08-06 ENCOUNTER — Ambulatory Visit

## 2023-08-06 ENCOUNTER — Other Ambulatory Visit (HOSPITAL_COMMUNITY): Payer: Self-pay

## 2023-08-06 DIAGNOSIS — M6281 Muscle weakness (generalized): Secondary | ICD-10-CM

## 2023-08-06 DIAGNOSIS — M5459 Other low back pain: Secondary | ICD-10-CM

## 2023-08-06 MED ORDER — HYRIMOZ 40 MG/0.4ML ~~LOC~~ SOAJ: 40.0000 mg | SUBCUTANEOUS | 0 refills | Status: DC

## 2023-08-06 NOTE — Therapy (Signed)
 OUTPATIENT PHYSICAL THERAPY TREATMENT   Patient Name: Alexis Norris MRN: [id number] DOB:11/20/1960, 63 y.o., male Today's Date: 08/06/2023   END OF SESSION:  PT End of Session - 08/06/23 1529     Visit Number 5    Number of Visits 17    Date for PT Re-Evaluation 09/15/23    Authorization Type [employer]et[employer]    PT Start Time 1530    Activity Tolerance Patient tolerated treatment well    Behavior During Therapy [facility] for tasks assessed/performed                 Past Medical History:  Diagnosis Date   Arthritis    DYSLIPIDEMIA 01/13/2007   no medicines    HYPERTENSION 01/13/2007   HYPOTHYROIDISM, 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   Lumbar back pain    Wrist clicking    Past Surgical History:  Procedure Laterality Date   COLONOSCOPY     I-131 therapy  09/27/1997   maybe 2000-2001   INGUINAL HERNIA REPAIR Right 09/28/2013   Procedure: LAPAROSCOPIC RIGHT  INGUINAL HERNIA;  Surgeon: Axel Filler, MD;  Location: [facility] OR;  Service: General;  Laterality: Right;   INSERTION OF MESH Right 09/28/2013   Procedure: INSERTION OF MESH;  Surgeon: Axel Filler, MD;  Location: [facility] OR;  Service: General;  Laterality: Right;   right knuckle     pin placed in high school   Patient Active Problem List   Diagnosis Date Noted   Bilateral hip pain 01/01/2023   Ankylosing spondylitis (HCC) 11/26/2022   Low back pain 08/01/2022   Vitamin D deficiency 08/01/2022   Diverticulosis 04/17/2022   GERD (gastroesophageal reflux disease) 04/17/2022   Erectile dysfunction 04/11/2021   Diffuse cutaneous mucinosis 04/10/2020   Gout 02/06/2016   High risk medication use 08/26/2011   Allergic rhinitis 08/26/2011   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   Dyslipidemia 01/13/2007   Essential hypertension 01/13/2007    PCP: Ardith Dark, MD  REFERRING PROVIDER: Fuller Plan, MD  REFERRING DIAG: Ankylosing spondylitis of thoracolumbar region; Chronic bilateral low back pain,  unspecified whether sciatica present  Rationale for Evaluation and Treatment: Rehabilitation  THERAPY DIAG:  Other low back pain  Muscle weakness (generalized)  ONSET DATE: Chronic   SUBJECTIVE:           SUBJECTIVE STATEMENT: Pt presents to PT with reports of continued slight soreness in L lower back. Has been compliant with HEP.   EVAL: Patient reports left lower back pain that he states this past Friday he started having more severe pain and he took a muscle relaxer which did take the pain away. Typically he could get through the day with the pain but over the past few days the pain has gotten much more severe and feels like when he is sitting it is compressing his lower and then when he gets up the pain is worse. The pain is sharp on the left side and occasionally he feel something radiating down the left leg. 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   PERTINENT HISTORY:  See PMH above  PAIN:  Are you having pain? Yes:  NPRS scale: 2/10 at rest, 6-8/10 at worst Pain location: Left lower back Pain description: Sharp Aggravating factors: Sitting and getting up from a seated position, if he I takes a wrong step or moves too fast Relieving factors: Medication  PRECAUTIONS: None  RED FLAGS: None   WEIGHT BEARING RESTRICTIONS: No  FALLS:  Has patient fallen in last  6 months? 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 PATIENT SURVEYS:  Modified Oswestry 19/50 (38% disability)   COGNITION: Overall cognitive status: Within functional limits for tasks assessed     SENSATION: WFL  MUSCLE LENGTH: Hamstring tightness bilaterally  POSTURE:   Reduced lumbar lordosis, thoracic kyphosis, rounded shoulders  PALPATION: Tender to palpation left lumbar paraspinals and QL  LUMBAR ROM:   AROM eval  Flexion 25%  Extension < 25%   Right lateral flexion 25%  Left lateral flexion 25%  Right rotation 25%  Left rotation 25%   (Blank rows = not tested)  LOWER EXTREMITY ROM:      Hip ROM grossly WFL  LOWER EXTREMITY MMT:    MMT Right eval Left eval  Hip flexion 4 4-  Hip extension 4- 3  Hip abduction 4 4-  Hip adduction    Hip internal rotation    Hip external rotation    Knee flexion 5 4+  Knee extension 5 4+  Ankle dorsiflexion    Ankle plantarflexion    Ankle inversion    Ankle eversion     (Blank rows = not tested)  LUMBAR SPECIAL TESTS:  Lumbar radicular testing negative  FUNCTIONAL TESTS:  Sit to stand: patient demonstrates slow and hesitant movement when attempting to stand due to expectation of onset of left lower back pain, use of UE for support  GAIT: Assistive device utilized: None Level of assistance: Complete Independence Comments: Grossly WFL   TREATMENT [facility] Adult PT Treatment:                                                DATE: 08/06/2023 NuStep lvl 5 UE/LE x 5 min while taking subjective LTR x 10 Bridge 2x10 with GTB FM row 2x10 20# Pallof press x 10 10# Manual Therapy: Skilled palpation of trigger points for TPDN Trigger Point Dry Needling:  Initial Treatment: Pt instructed on Dry Needling rational, procedures, and possible side effects. Pt instructed to expect mild to moderate muscle soreness later in the day and/or into the next day.  Pt instructed in methods to reduce muscle soreness. Pt instructed to continue prescribed HEP. Because Dry Needling was performed over or adjacent to a lung field, pt was educated on S/S of pneumothorax and to seek immediate medical attention should they occur.  Patient was educated on signs and symptoms of infection and other risk factors and advised to seek medical attention should they occur.  Patient verbalized understanding of these instructions and education.   Patient Verbal Consent Given: Yes Education Handout Provided:  Yes Muscles Treated: Left lumbar paraspinals Electrical Stimulation Performed: Yes, Parameters: Milli x 5 min low freq low intensity; then mirco x 5 min high freq high intensity Treatment Response/Outcome: Patient report no change in symptoms   PATIENT EDUCATION:  Education details: continue HEP Person educated: Patient Education method: Explanation, Demonstration, Tactile cues, Verbal cues, and Handouts Education comprehension: verbalized understanding, returned demonstration, verbal cues required, tactile cues required, and needs further education  HOME EXERCISE PROGRAM: Access Code: [email or web address]    ASSESSMENT: CLINICAL IMPRESSION: Pt was able to complete prescribed exercises with no adverse effect. Exercises today focused on improving lumbar mobility and lateral hip strength in order to decrease pain. Responded  well to TPDN and continued use of ESTIM today. Continues to benefit from skilled therapy, will continue to progress as able.   EVAL: Patient is a 63 y.o. male who was seen today for physical therapy evaluation and treatment for chronic left lower back pain. He does have a history of ankylosing spondylitis and his currently symptoms are primarily located to the left lumbar region and seem to be more muscular in nature. He exhibit significant limitations with his lumbar mobility, flexibility deficits, and strength deficits of the core/hip musculature that is likely contributing to his pain and decrease in activity tolerance that is impacting his functional ability.   OBJECTIVE IMPAIRMENTS: decreased activity tolerance, decreased ROM, decreased strength, impaired flexibility, postural dysfunction, and pain.   ACTIVITY LIMITATIONS: carrying, lifting, bending, sitting, standing, squatting, stairs, transfers, and locomotion level  PARTICIPATION LIMITATIONS: driving, community activity, and occupation  PERSONAL FACTORS: Fitness, Past/current experiences, and Time since onset of  injury/illness/exacerbation are also affecting patient's functional outcome.   REHAB POTENTIAL: Good  CLINICAL DECISION MAKING: Stable/uncomplicated  EVALUATION COMPLEXITY: Low   GOALS: Goals reviewed with patient? Yes  SHORT TERM GOALS: Target date: 08/18/2023  Patient will be I with initial HEP in order to progress with therapy. Baseline: HEP provided at eval Goal status: INITIAL  2.  Patient will report lower back pain </= 4/10 with activity in order to reduce functional limitations and return to work Baseline: 6-8/10 pain Goal status: INITIAL  3.  Patient will be able to perform sit to stand without hesitancy and good control in order to improve transfers with getting in and out of his work vehicle Baseline: patient with slow and hesitant movement and use of UE for support Goal status: INITIAL  LONG TERM GOALS: Target date: 09/15/2023  Patient will be I with final HEP to maintain progress from PT. Baseline: HEP provided at eval Goal status: INITIAL  2.  Patient will report modified oswestry </= 10/50 (20% disability) in order to indicate an improvement in functional status Baseline: 19/50 (38% disability)  Goal status: INITIAL  3.  Patient will demonstrate gross core and hip strength >/= 4/5 MMT in order to improve his activity tolerance with work related tasks Baseline: see limitations above Goal status: INITIAL  4.  Patient will report lower back pain </= 2/10 with all work related activities and driving in order to reduce functional limitations Baseline: 6-8/10 pain Goal status: INITIAL   PLAN: PT FREQUENCY: 1-2x/week  PT DURATION: 8 weeks  PLANNED INTERVENTIONS: 97164- PT Re-evaluation, 97110-Therapeutic exercises, 97530- Therapeutic activity, 97112- Neuromuscular re-education, 97535- Self Care, 78295- Manual therapy, [id number]- Aquatic Therapy, G0283- Electrical stimulation (unattended), [phone]- Electrical stimulation (manual), Patient/Family education, Taping, Dry  Needling, Joint mobilization, Joint manipulation, Spinal manipulation, Spinal mobilization, Cryotherapy, and Moist heat.  PLAN FOR NEXT SESSION: Review HEP and progress PRN, continued with manual/TPDN for left lumbar paraspinals and QL, progress core stabilization and hip strengthening, sit to stand and lifting mechanics/progression   Eloy End PT  08/06/23 3:29 PM

## 2023-08-06 NOTE — Telephone Encounter (Signed)
 Received notification from [facility] regarding a prior authorization for  HYRIMOZ . Authorization has been APPROVED from 08/02/2023 to 08/01/2024. Approval letter sent to scan center.  Unable to run test claim because patient must fill through [facility]: [phone]  Authorization # [id number]    Rx sent to [facility] for medication to be delivered to clinic. MyChart message sent to patient  [name], PharmD, MPH, BCPS, CPP Clinical Pharmacist (Rheumatology and Pulmonology)

## 2023-08-06 NOTE — Progress Notes (Unsigned)
 [name] [facility] [address] [place] [address] Phone: [phone]   Assessment and Plan:     There are no diagnoses linked to this encounter.  ***   Pertinent previous records reviewed include ***    Follow Up: ***     Subjective:   I, Lauris Serviss, am serving as a Neurosurgeon for Doctor Richardean Sale   Chief Complaint: low back pain    HPI:    07/03/2022 Patient is a 63 year old male complaining of low back pain. Patient states that pain started in December , felt like his lower back would spasm, pain has progressively gotten worst, advil helps, pain is radiating down his hamstring and butt, he feels the pain in his spine, no numbness or tingling, intermittent pain when he walks ,    07/24/2022 Patient states doing better than before. Medicine started helping about 3-4 days in. After 2 weeks tried to stop for 2-3 days and pain came back but not as intense. Takes medication when working. Can still feel discomfort in back, but intensity of pain has definitely decreased   08/28/2022 Patient states Pt has been working, has soreness and good day and bad days but spams have decreased    07/25/2023 Patient states phone note 07/23/2023 Patient states that his lower back pain has really flared up and he is having muscle spasms bad. Patient states that Dr.Rice sent him to PT and he went Monday and goes back this Monday.  Patient states that the pain is so bad he does not think he can go back to work tomorrow. His left side really hurts when he moves. He wants to know if he should be out of work . Patient also states that if he lays down it it not that bad but sitting it hurts really bad and at his job he is sitting all the time. He is asking if he should come see Dr. Jean Rosenthal. He states that he took muscle relaxer and it helped but the pain came back and that he has taken Advil with tylenol but the pain comes back as well.    Pain came back  last week. Really bad flare isnt able to sit. Flexeril helps a little   08/07/2023 Patient states   Relevant Historical Information: Hypertension, GERD, hypothyroidism  Additional pertinent review of systems negative.   Current Outpatient Medications:    Colchicine 0.6 MG CAPS, Day 1: Take 2 caps, then 1 cap an hour later. Day 2 and beyond: 1 cap daily, Disp: 30 capsule, Rfl: 0   cyclobenzaprine (FLEXERIL) 5 MG tablet, Take 1 tablet (5 mg total) by mouth at bedtime., Disp: 30 tablet, Rfl: 0   hydrocortisone (ANUSOL-HC) 2.5 % rectal cream, Place 1 Application rectally 2 (two) times daily., Disp: 30 g, Rfl: 1   levothyroxine (SYNTHROID) 125 MCG tablet, TAKE 2 TABLETS DAILY BEFOREBREAKFAST (Patient taking differently: TAKE 2 TABLETS DAILY BEFORE BREAKFAST Monday-Saturday, 3 tablets daily on sunday), Disp: 180 tablet, Rfl: 0   losartan (COZAAR) 100 MG tablet, Take 1 tablet (100 mg total) by mouth daily., Disp: 100 tablet, Rfl: 3   meloxicam (MOBIC) 15 MG tablet, Take 1 tablet (15 mg total) by mouth daily., Disp: 30 tablet, Rfl: 0   meloxicam (MOBIC) 15 MG tablet, Take 1 tablet (15 mg total) by mouth daily., Disp: 30 tablet, Rfl: 0   meloxicam (MOBIC) 15 MG tablet, Take 1 tablet (15 mg total) by mouth daily., Disp: 30  tablet, Rfl: 0   predniSONE (DELTASONE) 5 MG tablet, Take 5 mg by mouth daily., Disp: , Rfl:    SODIUM FLUORIDE 5000 ENAMEL 1.1-5 % GEL, See admin instructions., Disp: , Rfl:    sulfaSALAzine (AZULFIDINE) 500 MG tablet, Take 2 tablets (1,000 mg total) by mouth 2 (two) times daily., Disp: 360 tablet, Rfl: 0   tadalafil (CIALIS) 10 MG tablet, Take 1 tablet (10 mg total) by mouth daily., Disp: 90 tablet, Rfl: 1   Vitamin D, Ergocalciferol, (DRISDOL) 1.25 MG (50000 UNIT) CAPS capsule, Take 1 capsule (50,000 Units total) by mouth every 7 (seven) days., Disp: 12 capsule, Rfl: 0  Current Facility-Administered Medications:    0.9 %  sodium chloride infusion, 500 mL, Intravenous, Once,  Danis, Andreas Blower, MD   Objective:     There were no vitals filed for this visit.    There is no height or weight on file to calculate BMI.    Physical Exam:    ***   Electronically signed by:  [name] [facility] 7:31 AM 08/06/23

## 2023-08-07 ENCOUNTER — Ambulatory Visit: Admitting: Sports Medicine

## 2023-08-07 VITALS — BP 132/78 | HR 87 | Ht 74.0 in | Wt 291.0 lb

## 2023-08-07 DIAGNOSIS — M545 Low back pain, unspecified: Secondary | ICD-10-CM | POA: Diagnosis not present

## 2023-08-07 DIAGNOSIS — M455 Ankylosing spondylitis of thoracolumbar region: Secondary | ICD-10-CM | POA: Diagnosis not present

## 2023-08-07 DIAGNOSIS — G8929 Other chronic pain: Secondary | ICD-10-CM

## 2023-08-07 DIAGNOSIS — Z1589 Genetic susceptibility to other disease: Secondary | ICD-10-CM | POA: Diagnosis not present

## 2023-08-07 NOTE — Patient Instructions (Signed)
 MRI thoracic and lumbar  Continue meloxicam for 1 more week then discontinue Continue flexeril as needed for muscle spasm Continue HEP and PT  Can return to work without restrictions Follow up 5 days after MRI to discuss results

## 2023-08-11 ENCOUNTER — Ambulatory Visit

## 2023-08-11 DIAGNOSIS — M6281 Muscle weakness (generalized): Secondary | ICD-10-CM

## 2023-08-11 DIAGNOSIS — M5459 Other low back pain: Secondary | ICD-10-CM

## 2023-08-11 NOTE — Therapy (Signed)
 OUTPATIENT PHYSICAL THERAPY TREATMENT   Patient Name: Alexis Norris MRN: [id number] DOB:March 30, 1961, 63 y.o., male Today's Date: 08/12/2023   END OF SESSION:  PT End of Session - 08/11/23 1539     Visit Number 6    Number of Visits 17    Date for PT Re-Evaluation 09/15/23    Authorization Type [employer]    PT Start Time 1539   arrived late   PT Stop Time 1615    PT Time Calculation (min) 36 min    Activity Tolerance Patient tolerated treatment well    Behavior During Therapy [facility] for tasks assessed/performed                  Past Medical History:  Diagnosis Date   Arthritis    DYSLIPIDEMIA 01/13/2007   no medicines    HYPERTENSION 01/13/2007   HYPOTHYROIDISM, 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   Lumbar back pain    Wrist clicking    Past Surgical History:  Procedure Laterality Date   COLONOSCOPY     I-131 therapy  09/27/1997   maybe 2000-2001   INGUINAL HERNIA REPAIR Right 09/28/2013   Procedure: LAPAROSCOPIC RIGHT  INGUINAL HERNIA;  Surgeon: Shela Derby, MD;  Location: [facility] OR;  Service: General;  Laterality: Right;   INSERTION OF MESH Right 09/28/2013   Procedure: INSERTION OF MESH;  Surgeon: Shela Derby, MD;  Location: [facility] OR;  Service: General;  Laterality: Right;   right knuckle     pin placed in high school   Patient Active Problem List   Diagnosis Date Noted   Bilateral hip pain 01/01/2023   Ankylosing spondylitis (HCC) 11/26/2022   Low back pain 08/01/2022   Vitamin D deficiency 08/01/2022   Diverticulosis 04/17/2022   GERD (gastroesophageal reflux disease) 04/17/2022   Erectile dysfunction 04/11/2021   Diffuse cutaneous mucinosis 04/10/2020   Gout 02/06/2016   High risk medication use 08/26/2011   Allergic rhinitis 08/26/2011   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 Has continued HEP compliance, has been cleared for full return to work.   EVAL: Patient reports left lower back pain that he states this past Friday he started having more severe pain and he took a muscle relaxer which did take the pain away. Typically he could get through the day with the pain but over the past few days the pain has gotten much more severe and feels like when he is sitting it is compressing his lower and then when he gets up the pain is worse. The pain is sharp on the left side and occasionally he feel something radiating down the left leg. 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   PERTINENT HISTORY:  See PMH above  PAIN:  Are you having pain? Yes:  NPRS scale: 2/10 at rest, 6-8/10 at worst Pain location: Left lower back Pain description: Sharp Aggravating factors: Sitting and getting up from a seated position, if he I  takes a wrong step or moves too fast Relieving factors: Medication  PRECAUTIONS: None  RED FLAGS: None   WEIGHT BEARING RESTRICTIONS: No  FALLS:  Has patient fallen in last 6 months? 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 PATIENT SURVEYS:  Modified Oswestry 19/50 (38% disability)   COGNITION: Overall cognitive status: Within functional limits for tasks assessed     SENSATION: WFL  MUSCLE LENGTH: Hamstring tightness bilaterally  POSTURE:   Reduced lumbar lordosis, thoracic kyphosis,  rounded shoulders  PALPATION: Tender to palpation left lumbar paraspinals and QL  LUMBAR ROM:   AROM eval  Flexion 25%  Extension < 25%  Right lateral flexion 25%  Left lateral flexion 25%  Right rotation 25%  Left rotation 25%   (Blank rows = not tested)  LOWER EXTREMITY ROM:      Hip ROM grossly WFL  LOWER EXTREMITY MMT:    MMT Right eval Left eval  Hip flexion 4 4-  Hip extension 4- 3  Hip abduction 4 4-  Hip adduction    Hip internal rotation    Hip external rotation    Knee flexion 5 4+  Knee extension 5 4+  Ankle dorsiflexion    Ankle plantarflexion    Ankle inversion    Ankle eversion     (Blank rows = not tested)  LUMBAR SPECIAL TESTS:  Lumbar radicular testing negative  FUNCTIONAL TESTS:  Sit to stand: patient demonstrates slow and hesitant movement when attempting to stand due to expectation of onset of left lower back pain, use of UE for support  GAIT: Assistive device utilized: None Level of assistance: Complete Independence Comments: Grossly WFL   TREATMENT [facility] Adult PT Treatment:                                                DATE: 08/11/2023 NuStep lvl 5 UE/LE x 5 min while taking subjective Supine pilates SLR x 10 each Pilates bridge 2x10 FM row 2x10 20# Pallof press x 10 10# Trigger Point Dry Needling:  Initial Treatment: Pt instructed on Dry Needling rational, procedures, and possible side effects. Pt instructed to expect mild to moderate muscle soreness later in the day and/or into the next day.  Pt instructed in methods to reduce muscle soreness. Pt instructed to continue prescribed HEP. Because Dry Needling was performed over or adjacent to a lung field, pt was educated on S/S of pneumothorax and to seek immediate medical attention should they occur.  Patient was educated on signs and symptoms of infection and other risk factors and advised to seek medical attention should they occur.  Patient verbalized understanding of these  instructions and education.   Patient Verbal Consent Given: Yes Education Handout Provided: Yes Muscles Treated: Left lumbar paraspinals Electrical Stimulation Performed: Yes, Parameters: Milli x 5 min low freq low intensity; then mirco x 5 min high freq high intensity Treatment Response/Outcome: Patient report no change in symptoms   PATIENT EDUCATION:  Education details: continue HEP Person educated: Patient Education method: Explanation, Demonstration, Tactile cues, Verbal cues, and Handouts Education comprehension: verbalized understanding, returned demonstration, verbal cues required, tactile cues required, and needs further education  HOME EXERCISE PROGRAM: Access Code: [email or web address]    ASSESSMENT: CLINICAL IMPRESSION: Pt was able  to complete prescribed exercises with no adverse effect. Exercises today focused on improving lumbar mobility, lateral hip strength, and core/periscapular endurance in order to decrease pain. Responded well to TPDN and continued use of ESTIM today. Continues to benefit from skilled therapy, will continue to progress as able.   EVAL: Patient is a 64 y.o. male who was seen today for physical therapy evaluation and treatment for chronic left lower back pain. He does have a history of ankylosing spondylitis and his currently symptoms are primarily located to the left lumbar region and seem to be more muscular in nature. He exhibit significant limitations with his lumbar mobility, flexibility deficits, and strength deficits of the core/hip musculature that is likely contributing to his pain and decrease in activity tolerance that is impacting his functional ability.   OBJECTIVE IMPAIRMENTS: decreased activity tolerance, decreased ROM, decreased strength, impaired flexibility, postural dysfunction, and pain.   ACTIVITY LIMITATIONS: carrying, lifting, bending, sitting, standing, squatting, stairs, transfers, and locomotion level  PARTICIPATION LIMITATIONS: driving,  community activity, and occupation  PERSONAL FACTORS: Fitness, Past/current experiences, and Time since onset of injury/illness/exacerbation are also affecting patient's functional outcome.   REHAB POTENTIAL: Good  CLINICAL DECISION MAKING: Stable/uncomplicated  EVALUATION COMPLEXITY: Low   GOALS: Goals reviewed with patient? Yes  SHORT TERM GOALS: Target date: 08/18/2023  Patient will be I with initial HEP in order to progress with therapy. Baseline: HEP provided at eval Goal status: INITIAL  2.  Patient will report lower back pain </= 4/10 with activity in order to reduce functional limitations and return to work Baseline: 6-8/10 pain Goal status: INITIAL  3.  Patient will be able to perform sit to stand without hesitancy and good control in order to improve transfers with getting in and out of his work vehicle Baseline: patient with slow and hesitant movement and use of UE for support Goal status: INITIAL  LONG TERM GOALS: Target date: 09/15/2023  Patient will be I with final HEP to maintain progress from PT. Baseline: HEP provided at eval Goal status: INITIAL  2.  Patient will report modified oswestry </= 10/50 (20% disability) in order to indicate an improvement in functional status Baseline: 19/50 (38% disability)  Goal status: INITIAL  3.  Patient will demonstrate gross core and hip strength >/= 4/5 MMT in order to improve his activity tolerance with work related tasks Baseline: see limitations above Goal status: INITIAL  4.  Patient will report lower back pain </= 2/10 with all work related activities and driving in order to reduce functional limitations Baseline: 6-8/10 pain Goal status: INITIAL   PLAN: PT FREQUENCY: 1-2x/week  PT DURATION: 8 weeks  PLANNED INTERVENTIONS: 97164- PT Re-evaluation, 97110-Therapeutic exercises, 97530- Therapeutic activity, 97112- Neuromuscular re-education, 97535- Self Care, 16109- Manual therapy, [id number]- Aquatic Therapy, [id number]-  Electrical stimulation (unattended), [phone]- Electrical stimulation (manual), Patient/Family education, Taping, Dry Needling, Joint mobilization, Joint manipulation, Spinal manipulation, Spinal mobilization, Cryotherapy, and Moist heat.  PLAN FOR NEXT SESSION: Review HEP and progress PRN, continued with manual/TPDN for left lumbar paraspinals and QL, progress core stabilization and hip strengthening, sit to stand and lifting mechanics/progression   Ivor Mars PT  08/12/23 9:18 AM

## 2023-08-12 NOTE — Telephone Encounter (Signed)
 Called [facility] to schedule shipment of medication to clinic. Per rep, we are not authorized to schedule shipment. Called and provided copay card information. Rep able to process rx successfully. I called patient - he will call [facility] tomorrow to provide consent and schedule shipment to clinic  Geraldene Kleine, PharmD, MPH, BCPS, CPP Clinical Pharmacist (Rheumatology and Pulmonology)

## 2023-08-13 ENCOUNTER — Ambulatory Visit

## 2023-08-13 DIAGNOSIS — M5459 Other low back pain: Secondary | ICD-10-CM

## 2023-08-13 DIAGNOSIS — M6281 Muscle weakness (generalized): Secondary | ICD-10-CM

## 2023-08-13 NOTE — Therapy (Signed)
 OUTPATIENT PHYSICAL THERAPY TREATMENT   Patient Name: Alexis Norris MRN: [id number] DOB:06/20/1960, 63 y.o., male Today's Date: 08/13/2023   END OF SESSION:  PT End of Session - 08/13/23 1545     Visit Number 7    Number of Visits 17    Date for PT Re-Evaluation 09/15/23    Authorization Type [employer]    PT Start Time 1531    PT Stop Time 1616    PT Time Calculation (min) 45 min    Activity Tolerance Patient tolerated treatment well    Behavior During Therapy 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   Lumbar back pain    Wrist clicking    Past Surgical History:  Procedure Laterality Date   COLONOSCOPY     I-131 therapy  09/27/1997   maybe 2000-2001   INGUINAL HERNIA REPAIR Right 09/28/2013   Procedure: LAPAROSCOPIC RIGHT  INGUINAL HERNIA;  Surgeon: Axel Filler, MD;  Location: [facility] OR;  Service: General;  Laterality: Right;   INSERTION OF MESH Right 09/28/2013   Procedure: INSERTION OF MESH;  Surgeon: Axel Filler, MD;  Location: [facility] OR;  Service: General;  Laterality: Right;   right knuckle     pin placed in high school   Patient Active Problem List   Diagnosis Date Noted   Bilateral hip pain 01/01/2023   Ankylosing spondylitis (HCC) 11/26/2022   Low back pain 08/01/2022   Vitamin D deficiency 08/01/2022   Diverticulosis 04/17/2022   GERD (gastroesophageal reflux disease) 04/17/2022   Erectile dysfunction 04/11/2021   Diffuse cutaneous mucinosis 04/10/2020   Gout 02/06/2016   High risk medication use 08/26/2011   Allergic rhinitis 08/26/2011   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 Has continued HEP compliance.  EVAL: Patient reports left lower back pain that he states this past Friday he started having more severe pain and he took a muscle relaxer which did take the pain away. Typically he could get through the day with the pain but over the past few days the pain has gotten much more severe and feels like when he is sitting it is compressing his lower and then when he gets up the pain is worse. The pain is sharp on the left side and occasionally he feel something radiating down the left leg. 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   PERTINENT HISTORY:  See PMH above  PAIN:  Are you having pain? Yes:  NPRS scale: 2/10 at rest, 6-8/10 at worst Pain location: Left lower back Pain description: Sharp Aggravating factors: Sitting and getting up from a seated position, if he I takes a wrong step or moves too fast Relieving factors: Medication  PRECAUTIONS: None  RED FLAGS: None  WEIGHT BEARING RESTRICTIONS: No  FALLS:  Has patient fallen in last 6 months? 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 PATIENT SURVEYS:  Modified Oswestry 19/50 (38% disability)   COGNITION: Overall cognitive status: Within functional limits for tasks assessed     SENSATION: WFL  MUSCLE LENGTH: Hamstring tightness bilaterally  POSTURE:   Reduced lumbar lordosis, thoracic kyphosis, rounded shoulders  PALPATION: Tender to palpation left lumbar paraspinals and QL  LUMBAR  ROM:   AROM eval  Flexion 25%  Extension < 25%  Right lateral flexion 25%  Left lateral flexion 25%  Right rotation 25%  Left rotation 25%   (Blank rows = not tested)  LOWER EXTREMITY ROM:      Hip ROM grossly WFL  LOWER EXTREMITY MMT:    MMT Right eval Left eval  Hip flexion 4 4-  Hip extension 4- 3  Hip abduction 4 4-  Hip adduction    Hip internal rotation    Hip external rotation    Knee flexion 5 4+  Knee extension 5 4+  Ankle dorsiflexion    Ankle plantarflexion    Ankle inversion    Ankle eversion     (Blank rows = not tested)  LUMBAR SPECIAL TESTS:  Lumbar radicular testing negative  FUNCTIONAL TESTS:  Sit to stand: patient demonstrates slow and hesitant movement when attempting to stand due to expectation of onset of left lower back pain, use of UE for support  GAIT: Assistive device utilized: None Level of assistance: Complete Independence Comments: Grossly WFL   TREATMENT [facility] Adult PT Treatment:                                                DATE: 08/13/2023 NuStep lvl 5 UE/LE x 5 min while taking subjective FM row 2x10 23# Pallof press 2x10 13# Lateral walk RTB x 3 laps  Manual Therapy: Skilled palpation of trigger points for TPDN STM to L lumbar paraspinals Trigger Point Dry Needling:  Initial Treatment: Pt instructed on Dry Needling rational, procedures, and possible side effects. Pt instructed to expect mild to moderate muscle soreness later in the day and/or into the next day.  Pt instructed in methods to reduce muscle soreness. Pt instructed to continue prescribed HEP. Because Dry Needling was performed over or adjacent to a lung field, pt was educated on S/S of pneumothorax and to seek immediate medical attention should they occur.  Patient was educated on signs and symptoms of infection and other risk factors and advised to seek medical attention should they occur.  Patient verbalized understanding of these instructions and education.    Patient Verbal Consent Given: Yes Education Handout Provided: Yes Muscles Treated: Left lumbar paraspinals Electrical Stimulation Performed: Yes, Parameters: Milli x 5 min low freq low intensity; then mirco x 5 min high freq high intensity Treatment Response/Outcome: Patient report no change in symptoms   PATIENT EDUCATION:  Education details: continue HEP Person educated: Patient Education method: Explanation, Demonstration, Tactile cues, Verbal cues, and Handouts Education comprehension: verbalized understanding, returned demonstration, verbal cues required, tactile cues required, and needs further education  HOME EXERCISE PROGRAM: Access Code: [email or web address]    ASSESSMENT: CLINICAL IMPRESSION: Pt was able to complete prescribed exercises with no adverse effect. Exercises  today focused on improving lumbar mobility, lateral hip strength, and core/periscapular endurance in order to decrease pain. Responded well to TPDN and continued use of ESTIM today. Also incorporated STM to L sided lumbar musculature. Continues to benefit from skilled therapy, will continue to progress as able.   EVAL: Patient is a 63 y.o. male who was seen today for physical therapy evaluation and treatment for chronic left lower back pain. He does have a history of ankylosing spondylitis and his currently symptoms are primarily located to the left lumbar region and seem to be more muscular in nature. He exhibit significant limitations with his lumbar mobility, flexibility deficits, and strength deficits of the core/hip musculature that is likely contributing to his pain and decrease in activity tolerance that is impacting his functional ability.   OBJECTIVE IMPAIRMENTS: decreased activity tolerance, decreased ROM, decreased strength, impaired flexibility, postural dysfunction, and pain.   ACTIVITY LIMITATIONS: carrying, lifting, bending, sitting, standing, squatting, stairs, transfers, and locomotion  level  PARTICIPATION LIMITATIONS: driving, community activity, and occupation  PERSONAL FACTORS: Fitness, Past/current experiences, and Time since onset of injury/illness/exacerbation are also affecting patient's functional outcome.   REHAB POTENTIAL: Good  CLINICAL DECISION MAKING: Stable/uncomplicated  EVALUATION COMPLEXITY: Low   GOALS: Goals reviewed with patient? Yes  SHORT TERM GOALS: Target date: 08/18/2023  Patient will be I with initial HEP in order to progress with therapy. Baseline: HEP provided at eval Goal status: INITIAL  2.  Patient will report lower back pain </= 4/10 with activity in order to reduce functional limitations and return to work Baseline: 6-8/10 pain Goal status: INITIAL  3.  Patient will be able to perform sit to stand without hesitancy and good control in order to improve transfers with getting in and out of his work vehicle Baseline: patient with slow and hesitant movement and use of UE for support Goal status: INITIAL  LONG TERM GOALS: Target date: 09/15/2023  Patient will be I with final HEP to maintain progress from PT. Baseline: HEP provided at eval Goal status: INITIAL  2.  Patient will report modified oswestry </= 10/50 (20% disability) in order to indicate an improvement in functional status Baseline: 19/50 (38% disability)  Goal status: INITIAL  3.  Patient will demonstrate gross core and hip strength >/= 4/5 MMT in order to improve his activity tolerance with work related tasks Baseline: see limitations above Goal status: INITIAL  4.  Patient will report lower back pain </= 2/10 with all work related activities and driving in order to reduce functional limitations Baseline: 6-8/10 pain Goal status: INITIAL   PLAN: PT FREQUENCY: 1-2x/week  PT DURATION: 8 weeks  PLANNED INTERVENTIONS: 97164- PT Re-evaluation, 97110-Therapeutic exercises, 97530- Therapeutic activity, 97112- Neuromuscular re-education, 97535- Self Care, 60454-  Manual therapy, [id number]- Aquatic Therapy, G0283- Electrical stimulation (unattended), [phone]- Electrical stimulation (manual), Patient/Family education, Taping, Dry Needling, Joint mobilization, Joint manipulation, Spinal manipulation, Spinal mobilization, Cryotherapy, and Moist heat.  PLAN FOR NEXT SESSION: Review HEP and progress PRN, continued with manual/TPDN for left lumbar paraspinals and QL, progress core stabilization and hip strengthening, sit to stand and lifting mechanics/progression   Ivor Mars PT  08/13/23 4:36 PM

## 2023-08-14 NOTE — Telephone Encounter (Signed)
 Patient called to inform us  that he has scheduled delivery of hyrimoz to our office. It is scheduled to be delivered before next Friday. He wanted to ensure there wasn't anything else that needed to be completed. I called [facility] to ensure the medication is ready to ship to the clinic and requested a delivery date change as I was advised by the pharmacy that it was scheduled for delivery on 08/22/2023 and our office is only open half days on Fridays. The new delivery date is 08/21/2023. I also had to speak with a pharmacist, Nicholes Barks and provide TB Gold results: 03/19/2023 negative.   Also advised patient that he would be contacted once medication is received in the office to schedule new start visit. He verbalized understanding.

## 2023-08-18 ENCOUNTER — Other Ambulatory Visit: Payer: Self-pay | Admitting: *Deleted

## 2023-08-18 ENCOUNTER — Telehealth: Payer: Self-pay | Admitting: Family Medicine

## 2023-08-18 ENCOUNTER — Ambulatory Visit

## 2023-08-18 DIAGNOSIS — E559 Vitamin D deficiency, unspecified: Secondary | ICD-10-CM

## 2023-08-18 DIAGNOSIS — M5459 Other low back pain: Secondary | ICD-10-CM

## 2023-08-18 DIAGNOSIS — M6281 Muscle weakness (generalized): Secondary | ICD-10-CM

## 2023-08-18 NOTE — Telephone Encounter (Signed)
 I do not see any orders for labs. Please advise.    Copied from CRM [id number]. Topic: General - Other >> Aug 18, 2023 12:07 PM [employer] wrote: Patient would like to know if he would need to be fasting for his labs prior to his appointment on 09/01/23  Callback #: [id number]

## 2023-08-18 NOTE — Telephone Encounter (Signed)
 Future lab order, no need to be fasting

## 2023-08-18 NOTE — Therapy (Signed)
 OUTPATIENT PHYSICAL THERAPY TREATMENT   Patient Name: Maxden Naji MRN: [id number] DOB:Oct 22, 1960, 63 y.o., male Today's Date: 08/19/2023   END OF SESSION:  PT End of Session - 08/18/23 1535     Visit Number 8    Number of Visits 17    Date for PT Re-Evaluation 09/15/23    Authorization Type [employer]et[employer]    PT Start Time 1534    PT Stop Time 1614    PT Time Calculation (min) 40 min    Activity Tolerance Patient tolerated treatment well    Behavior During Therapy [facility] for tasks assessed/performed                    Past Medical History:  Diagnosis Date   Arthritis    DYSLIPIDEMIA 01/13/2007   no medicines    HYPERTENSION 01/13/2007   HYPOTHYROIDISM, 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   Lumbar back pain    Wrist clicking    Past Surgical History:  Procedure Laterality Date   COLONOSCOPY     I-131 therapy  09/27/1997   maybe 2000-2001   INGUINAL HERNIA REPAIR Right 09/28/2013   Procedure: LAPAROSCOPIC RIGHT  INGUINAL HERNIA;  Surgeon: Shela Derby, MD;  Location: [facility] OR;  Service: General;  Laterality: Right;   INSERTION OF MESH Right 09/28/2013   Procedure: INSERTION OF MESH;  Surgeon: Shela Derby, MD;  Location: [facility] OR;  Service: General;  Laterality: Right;   right knuckle     pin placed in high school   Patient Active Problem List   Diagnosis Date Noted   Bilateral hip pain 01/01/2023   Ankylosing spondylitis (HCC) 11/26/2022   Low back pain 08/01/2022   Vitamin D  deficiency 08/01/2022   Diverticulosis 04/17/2022   GERD (gastroesophageal reflux disease) 04/17/2022   Erectile dysfunction 04/11/2021   Diffuse cutaneous mucinosis 04/10/2020   Gout 02/06/2016   High risk medication use 08/26/2011   Allergic rhinitis 08/26/2011   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 Has continued HEP compliance.  EVAL: Patient reports left lower back pain that he states this past Friday he started having more severe pain and he took a muscle relaxer which did take the pain away. Typically he could get through the day with the pain but over the past few days the pain has gotten much more severe and feels like when he is sitting it is compressing his lower and then when he gets up the pain is worse. The pain is sharp on the left side and occasionally he feel something radiating down the left leg. 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   PERTINENT HISTORY:  See PMH above  PAIN:  Are you having pain? Yes:  NPRS scale: 2/10 at rest, 6-8/10 at worst Pain location: Left lower back Pain description: Sharp Aggravating factors: Sitting and getting up from a seated position, if he I takes a wrong step or moves too fast Relieving factors: Medication  PRECAUTIONS: None  RED FLAGS: None   WEIGHT BEARING RESTRICTIONS: No  FALLS:  Has patient fallen in last 6 months? 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 PATIENT SURVEYS:  Modified Oswestry 19/50 (38% disability)   COGNITION: Overall cognitive status: Within functional limits for tasks assessed     SENSATION: WFL  MUSCLE LENGTH: Hamstring tightness bilaterally  POSTURE:   Reduced lumbar lordosis, thoracic kyphosis, rounded shoulders  PALPATION: Tender to palpation left lumbar  paraspinals and QL  LUMBAR ROM:   AROM eval  Flexion 25%  Extension < 25%  Right lateral flexion 25%  Left lateral flexion 25%  Right rotation 25%  Left rotation 25%   (Blank rows = not tested)  LOWER EXTREMITY ROM:      Hip ROM grossly WFL  LOWER EXTREMITY MMT:    MMT Right eval Left eval  Hip flexion 4 4-  Hip extension 4- 3  Hip abduction 4 4-  Hip adduction    Hip internal rotation    Hip external rotation    Knee flexion 5 4+  Knee extension 5 4+  Ankle dorsiflexion    Ankle plantarflexion    Ankle inversion    Ankle eversion     (Blank rows = not tested)  LUMBAR SPECIAL TESTS:  Lumbar radicular testing negative  FUNCTIONAL TESTS:  Sit to stand: patient demonstrates slow and hesitant movement when attempting to stand due to expectation of onset of left lower back pain, use of UE for support  GAIT: Assistive device utilized: None Level of assistance: Complete Independence Comments: Grossly WFL   TREATMENT [facility] Adult PT Treatment:                                                DATE: 08/18/2023 NuStep lvl 5 UE/LE x 5 min for functional activity tolerance LTR x 10 Supine pilates SLR 2x10 each Pilates bridge 2x10 Pallof press 2x10 13# Manual Therapy: Skilled palpation of trigger points for TPDN STM to L lumbar paraspinals Trigger Point Dry Needling:  Initial Treatment: Pt instructed on Dry Needling rational, procedures, and possible side effects. Pt instructed to expect mild to moderate muscle soreness later in the day and/or into the next day.  Pt instructed in methods to reduce muscle soreness. Pt instructed to continue prescribed HEP. Because Dry Needling was performed over or adjacent to a lung field, pt was educated on S/S of pneumothorax and to seek immediate medical attention should they occur.  Patient was educated on signs and symptoms of infection and other risk factors and advised to seek medical attention should they occur.  Patient  verbalized understanding of these instructions and education.   Patient Verbal Consent Given: Yes Education Handout Provided: Yes Muscles Treated: Left lumbar paraspinals Electrical Stimulation Performed: Yes, Parameters: Milli x 5 min low freq low intensity; then mirco x 5 min high freq high intensity Treatment Response/Outcome: Patient report no change in symptoms   PATIENT EDUCATION:  Education details: continue HEP Person educated: Patient Education method: Explanation, Demonstration, Tactile cues, Verbal cues, and Handouts Education comprehension: verbalized understanding, returned demonstration, verbal cues required, tactile cues required, and needs further education  HOME EXERCISE PROGRAM: Access Code: [email or web address]    ASSESSMENT: CLINICAL IMPRESSION: Pt was able to complete prescribed  exercises with no adverse effect. Exercises today focused on improving lumbar mobility, lateral hip strength, and core/periscapular endurance in order to decrease pain. Responded well to TPDN and continued use of ESTIM today. Also incorporated STM to L sided lumbar musculature. Continues to benefit from skilled therapy, will continue to progress as able.   EVAL: Patient is a 63 y.o. male who was seen today for physical therapy evaluation and treatment for chronic left lower back pain. He does have a history of ankylosing spondylitis and his currently symptoms are primarily located to the left lumbar region and seem to be more muscular in nature. He exhibit significant limitations with his lumbar mobility, flexibility deficits, and strength deficits of the core/hip musculature that is likely contributing to his pain and decrease in activity tolerance that is impacting his functional ability.   OBJECTIVE IMPAIRMENTS: decreased activity tolerance, decreased ROM, decreased strength, impaired flexibility, postural dysfunction, and pain.   ACTIVITY LIMITATIONS: carrying, lifting, bending, sitting, standing,  squatting, stairs, transfers, and locomotion level  PARTICIPATION LIMITATIONS: driving, community activity, and occupation  PERSONAL FACTORS: Fitness, Past/current experiences, and Time since onset of injury/illness/exacerbation are also affecting patient's functional outcome.   REHAB POTENTIAL: Good  CLINICAL DECISION MAKING: Stable/uncomplicated  EVALUATION COMPLEXITY: Low   GOALS: Goals reviewed with patient? Yes  SHORT TERM GOALS: Target date: 08/18/2023  Patient will be I with initial HEP in order to progress with therapy. Baseline: HEP provided at eval Goal status: MET  2.  Patient will report lower back pain </= 4/10 with activity in order to reduce functional limitations and return to work Baseline: 6-8/10 pain Goal status: MET  3.  Patient will be able to perform sit to stand without hesitancy and good control in order to improve transfers with getting in and out of his work vehicle Baseline: patient with slow and hesitant movement and use of UE for support Goal status: INITIAL  LONG TERM GOALS: Target date: 09/15/2023  Patient will be I with final HEP to maintain progress from PT. Baseline: HEP provided at eval Goal status: INITIAL  2.  Patient will report modified oswestry </= 10/50 (20% disability) in order to indicate an improvement in functional status Baseline: 19/50 (38% disability)  Goal status: INITIAL  3.  Patient will demonstrate gross core and hip strength >/= 4/5 MMT in order to improve his activity tolerance with work related tasks Baseline: see limitations above Goal status: INITIAL  4.  Patient will report lower back pain </= 2/10 with all work related activities and driving in order to reduce functional limitations Baseline: 6-8/10 pain Goal status: INITIAL   PLAN: PT FREQUENCY: 1-2x/week  PT DURATION: 8 weeks  PLANNED INTERVENTIONS: 97164- PT Re-evaluation, 97110-Therapeutic exercises, 97530- Therapeutic activity, 97112- Neuromuscular  re-education, 97535- Self Care, 60454- Manual therapy, [id number]- Aquatic Therapy, G0283- Electrical stimulation (unattended), [phone]- Electrical stimulation (manual), Patient/Family education, Taping, Dry Needling, Joint mobilization, Joint manipulation, Spinal manipulation, Spinal mobilization, Cryotherapy, and Moist heat.  PLAN FOR NEXT SESSION: Review HEP and progress PRN, continued with manual/TPDN for left lumbar paraspinals and QL, progress core stabilization and hip strengthening, sit to stand and lifting mechanics/progression   Ivor Mars PT  08/19/23 7:59 AM

## 2023-08-20 ENCOUNTER — Telehealth: Payer: Self-pay | Admitting: Sports Medicine

## 2023-08-20 ENCOUNTER — Other Ambulatory Visit: Payer: Self-pay | Admitting: Sports Medicine

## 2023-08-20 ENCOUNTER — Ambulatory Visit

## 2023-08-20 DIAGNOSIS — M6281 Muscle weakness (generalized): Secondary | ICD-10-CM

## 2023-08-20 DIAGNOSIS — M5459 Other low back pain: Secondary | ICD-10-CM | POA: Diagnosis not present

## 2023-08-20 DIAGNOSIS — F40240 Claustrophobia: Secondary | ICD-10-CM

## 2023-08-20 MED ORDER — LORAZEPAM 0.5 MG PO TABS: ORAL_TABLET | ORAL | 0 refills | Status: DC

## 2023-08-20 NOTE — Telephone Encounter (Signed)
 Pt informed that med called in today via phone.

## 2023-08-20 NOTE — Progress Notes (Signed)
 Ativan  0.5mg  1-2 tablets to be taken prior to MRI for claustrophobia.

## 2023-08-20 NOTE — Telephone Encounter (Signed)
 Pt requesting meds for his MRI 4/24 due to extreme claustrophobia.  [facility] on [address].  Pt noted he is very claustrophobic and has not tried valium before but does not want to be conservative with the dosage as he is worried he may not be able to get through this procedure.

## 2023-08-20 NOTE — Therapy (Signed)
 OUTPATIENT PHYSICAL THERAPY TREATMENT   Patient Name: Alexis Norris MRN: [id number] DOB:03/19/1961, 63 y.o., male Today's Date: 08/21/2023   END OF SESSION:  PT End of Session - 08/20/23 1615     Visit Number 9    Number of Visits 17    Date for PT Re-Evaluation 09/15/23    Authorization Type [employer]    PT Start Time 1615    PT Stop Time 1655    PT Time Calculation (min) 40 min    Activity Tolerance Patient tolerated treatment well    Behavior During Therapy [facility] for tasks assessed/performed                     Past Medical History:  Diagnosis Date   Arthritis    DYSLIPIDEMIA 01/13/2007   no medicines    HYPERTENSION 01/13/2007   HYPOTHYROIDISM, 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   Lumbar back pain    Wrist clicking    Past Surgical History:  Procedure Laterality Date   COLONOSCOPY     I-131 therapy  09/27/1997   maybe 2000-2001   INGUINAL HERNIA REPAIR Right 09/28/2013   Procedure: LAPAROSCOPIC RIGHT  INGUINAL HERNIA;  Surgeon: Shela Derby, MD;  Location: [facility] OR;  Service: General;  Laterality: Right;   INSERTION OF MESH Right 09/28/2013   Procedure: INSERTION OF MESH;  Surgeon: Shela Derby, MD;  Location: [facility] OR;  Service: General;  Laterality: Right;   right knuckle     pin placed in high school   Patient Active Problem List   Diagnosis Date Noted   Bilateral hip pain 01/01/2023   Ankylosing spondylitis (HCC) 11/26/2022   Low back pain 08/01/2022   Vitamin D  deficiency 08/01/2022   Diverticulosis 04/17/2022   GERD (gastroesophageal reflux disease) 04/17/2022   Erectile dysfunction 04/11/2021   Diffuse cutaneous mucinosis 04/10/2020   Gout 02/06/2016   High risk medication use 08/26/2011   Allergic rhinitis 08/26/2011   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 Has continued HEP compliance.  EVAL: Patient reports left lower back pain that he states this past Friday he started having more severe pain and he took a muscle relaxer which did take the pain away. Typically he could get through the day with the pain but over the past few days the pain has gotten much more severe and feels like when he is sitting it is compressing his lower and then when he gets up the pain is worse. The pain is sharp on the left side and occasionally he feel something radiating down the left leg. 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   PERTINENT HISTORY:  See PMH above  PAIN:  Are you having pain? Yes:  NPRS scale: 2/10 at rest, 6-8/10 at worst Pain location: Left lower back Pain description: Sharp Aggravating factors: Sitting and getting up from a seated position, if he I takes a wrong step or moves too fast Relieving factors: Medication  PRECAUTIONS:  None  RED FLAGS: None   WEIGHT BEARING RESTRICTIONS: No  FALLS:  Has patient fallen in last 6 months? 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 PATIENT SURVEYS:  Modified Oswestry 19/50 (38% disability)   COGNITION: Overall cognitive status: Within functional limits for tasks assessed     SENSATION: WFL  MUSCLE LENGTH: Hamstring tightness bilaterally  POSTURE:   Reduced lumbar lordosis, thoracic kyphosis, rounded shoulders  PALPATION: Tender to palpation left lumbar  paraspinals and QL  LUMBAR ROM:   AROM eval  Flexion 25%  Extension < 25%  Right lateral flexion 25%  Left lateral flexion 25%  Right rotation 25%  Left rotation 25%   (Blank rows = not tested)  LOWER EXTREMITY ROM:      Hip ROM grossly WFL  LOWER EXTREMITY MMT:    MMT Right eval Left eval  Hip flexion 4 4-  Hip extension 4- 3  Hip abduction 4 4-  Hip adduction    Hip internal rotation    Hip external rotation    Knee flexion 5 4+  Knee extension 5 4+  Ankle dorsiflexion    Ankle plantarflexion    Ankle inversion    Ankle eversion     (Blank rows = not tested)  LUMBAR SPECIAL TESTS:  Lumbar radicular testing negative  FUNCTIONAL TESTS:  Sit to stand: patient demonstrates slow and hesitant movement when attempting to stand due to expectation of onset of left lower back pain, use of UE for support  GAIT: Assistive device utilized: None Level of assistance: Complete Independence Comments: Grossly WFL   TREATMENT [facility] Adult PT Treatment:                                                DATE: 08/20/2023 NuStep lvl 5 UE/LE x 5 min for functional activity tolerance FM row 3x10 23# Pallof press 2x10 13# Standing chop 2x10 13# each Manual Therapy: Skilled palpation of trigger points for TPDN STM to L lumbar paraspinals Trigger Point Dry Needling:  Subsequent Treatment: Pt instructed on Dry Needling rational, procedures, and possible side effects. Pt instructed to expect mild to moderate muscle soreness later in the day and/or into the next day.  Pt instructed in methods to reduce muscle soreness. Pt instructed to continue prescribed HEP. Because Dry Needling was performed over or adjacent to a lung field, pt was educated on S/S of pneumothorax and to seek immediate medical attention should they occur.  Patient was educated on signs and symptoms of infection and other risk factors and advised to seek medical attention should they occur.  Patient verbalized  understanding of these instructions and education.   Patient Verbal Consent Given: Yes Education Handout Provided: Yes Muscles Treated: Left lumbar paraspinals Electrical Stimulation Performed: Yes, Parameters: Milli x 5 min low freq low intensity; then mirco x 5 min high freq high intensity Treatment Response/Outcome: Patient report no change in symptoms   PATIENT EDUCATION:  Education details: continue HEP Person educated: Patient Education method: Explanation, Demonstration, Tactile cues, Verbal cues, and Handouts Education comprehension: verbalized understanding, returned demonstration, verbal cues required, tactile cues required, and needs further education  HOME EXERCISE PROGRAM: Access Code: [email or web address]    ASSESSMENT: CLINICAL IMPRESSION: Pt was able to complete prescribed  exercises with no adverse effect. Exercises today focused on improving lumbar mobility, lateral hip strength, and core/periscapular endurance in order to decrease pain. Slight decrease in ODI disability score, pt able to improve sit>stand with improved mobility and decrease pain. Responded well to TPDN and continued use of ESTIM today. Continues to benefit from skilled therapy, will continue to progress as able, will decrease to one visit per week.   EVAL: Patient is a 63 y.o. male who was seen today for physical therapy evaluation and treatment for chronic left lower back pain. He does have a history of ankylosing spondylitis and his currently symptoms are primarily located to the left lumbar region and seem to be more muscular in nature. He exhibit significant limitations with his lumbar mobility, flexibility deficits, and strength deficits of the core/hip musculature that is likely contributing to his pain and decrease in activity tolerance that is impacting his functional ability.   OBJECTIVE IMPAIRMENTS: decreased activity tolerance, decreased ROM, decreased strength, impaired flexibility, postural dysfunction,  and pain.   ACTIVITY LIMITATIONS: carrying, lifting, bending, sitting, standing, squatting, stairs, transfers, and locomotion level  PARTICIPATION LIMITATIONS: driving, community activity, and occupation  PERSONAL FACTORS: Fitness, Past/current experiences, and Time since onset of injury/illness/exacerbation are also affecting patient's functional outcome.   REHAB POTENTIAL: Good  CLINICAL DECISION MAKING: Stable/uncomplicated  EVALUATION COMPLEXITY: Low   GOALS: Goals reviewed with patient? Yes  SHORT TERM GOALS: Target date: 08/18/2023  Patient will be I with initial HEP in order to progress with therapy. Baseline: HEP provided at eval Goal status: MET  2.  Patient will report lower back pain </= 4/10 with activity in order to reduce functional limitations and return to work Baseline: 6-8/10 pain Goal status: MET  3.  Patient will be able to perform sit to stand without hesitancy and good control in order to improve transfers with getting in and out of his work vehicle Baseline: patient with slow and hesitant movement and use of UE for support Goal status: MET  LONG TERM GOALS: Target date: 09/15/2023  Patient will be I with final HEP to maintain progress from PT. Baseline: HEP provided at eval Goal status: INITIAL  2.  Patient will report modified oswestry </= 10/50 (20% disability) in order to indicate an improvement in functional status Baseline: 19/50 (38% disability)  08/20/2023: 18/50 (36% disability) Goal status: IN PROGRESS  3.  Patient will demonstrate gross core and hip strength >/= 4/5 MMT in order to improve his activity tolerance with work related tasks Baseline: see limitations above Goal status: IN PROGRESS  4.  Patient will report lower back pain </= 2/10 with all work related activities and driving in order to reduce functional limitations Baseline: 6-8/10 pain Goal status: IN PROGRESS   PLAN: PT FREQUENCY: 1-2x/week  PT DURATION: 8  weeks  PLANNED INTERVENTIONS: 97164- PT Re-evaluation, 97110-Therapeutic exercises, 97530- Therapeutic activity, 97112- Neuromuscular re-education, 97535- Self Care, 57846- Manual therapy, [id number]- Aquatic Therapy, G0283- Electrical stimulation (unattended), [phone]- Electrical stimulation (manual), Patient/Family education, Taping, Dry Needling, Joint mobilization, Joint manipulation, Spinal manipulation, Spinal mobilization, Cryotherapy, and Moist heat.  PLAN FOR NEXT SESSION: Review HEP and progress PRN, continued with manual/TPDN for left lumbar paraspinals and QL, progress core stabilization and hip strengthening, sit to stand and lifting mechanics/progression   Ivor Mars PT  08/21/23 7:54 AM

## 2023-08-21 ENCOUNTER — Ambulatory Visit
Admission: RE | Admit: 2023-08-21 | Discharge: 2023-08-21 | Disposition: A | Discharge status: Home / Self Care | Source: Ambulatory Visit | Attending: Sports Medicine | Admitting: Sports Medicine

## 2023-08-21 ENCOUNTER — Ambulatory Visit
Admission: RE | Admit: 2023-08-21 | Discharge: 2023-08-21 | Disposition: A | Discharge status: Home / Self Care | Source: Ambulatory Visit | Attending: Sports Medicine

## 2023-08-21 DIAGNOSIS — Z1589 Genetic susceptibility to other disease: Secondary | ICD-10-CM

## 2023-08-21 DIAGNOSIS — M455 Ankylosing spondylitis of thoracolumbar region: Secondary | ICD-10-CM

## 2023-08-21 DIAGNOSIS — G8929 Other chronic pain: Secondary | ICD-10-CM

## 2023-08-25 ENCOUNTER — Other Ambulatory Visit (INDEPENDENT_AMBULATORY_CARE_PROVIDER_SITE_OTHER)

## 2023-08-25 ENCOUNTER — Ambulatory Visit: Attending: Internal Medicine | Admitting: Pharmacist

## 2023-08-25 ENCOUNTER — Other Ambulatory Visit

## 2023-08-25 DIAGNOSIS — E559 Vitamin D deficiency, unspecified: Secondary | ICD-10-CM

## 2023-08-25 DIAGNOSIS — Z7189 Other specified counseling: Secondary | ICD-10-CM

## 2023-08-25 DIAGNOSIS — M455 Ankylosing spondylitis of thoracolumbar region: Secondary | ICD-10-CM

## 2023-08-25 DIAGNOSIS — Z79899 Other long term (current) drug therapy: Secondary | ICD-10-CM | POA: Diagnosis not present

## 2023-08-25 MED ORDER — HYRIMOZ 40 MG/0.4ML ~~LOC~~ SOAJ: 40.0000 mg | SUBCUTANEOUS | 1 refills | Status: DC

## 2023-08-25 NOTE — Progress Notes (Signed)
 Pharmacy Note  Subjective:   Patient presents to clinic today to receive first dose of Hyrimoz  for ankylosing spondylitis. Previously on sulfasalazine  until end of 2024 which did appear to provide relief based on OV note review. Also seen by dermatology who recommended adalimumab   Patient running a fever or have signs/symptoms of infection? No  Patient currently on antibiotics for the treatment of infection? No  Patient have any upcoming invasive procedures/surgeries? No  Objective: CMP     Component Value Date/Time   NA 142 05/29/2023 1342   K 3.8 05/29/2023 1342   CL 108 05/29/2023 1342   CO2 25 05/29/2023 1342   GLUCOSE 84 05/29/2023 1342   BUN 10 05/29/2023 1342   CREATININE 0.85 05/29/2023 1342   CREATININE 0.98 03/19/2023 1401   CALCIUM 9.1 05/29/2023 1342   PROT 6.8 05/29/2023 1342   ALBUMIN 4.4 05/29/2023 1342   AST 12 05/29/2023 1342   ALT 7 05/29/2023 1342   ALKPHOS 80 05/29/2023 1342   BILITOT 0.5 05/29/2023 1342   GFRNONAA >90 09/24/2013 1441   GFRAA >90 09/24/2013 1441    CBC    Component Value Date/Time   WBC 6.3 05/29/2023 1342   RBC 4.97 05/29/2023 1342   HGB 13.8 05/29/2023 1342   HCT 41.7 05/29/2023 1342   PLT 255.0 05/29/2023 1342   MCV 83.9 05/29/2023 1342   MCH 27.0 03/19/2023 1401   MCHC 33.2 05/29/2023 1342   RDW 14.3 05/29/2023 1342   LYMPHSABS 1,168 01/01/2023 1455   MONOABS 0.6 07/03/2022 1130   EOSABS 178 03/19/2023 1401   BASOSABS 41 03/19/2023 1401    Baseline Immunosuppressant Therapy Labs TB GOLD    Latest Ref Rng & Units 03/19/2023    2:01 PM  Quantiferon TB Gold  Quantiferon TB Gold Plus NEGATIVE NEGATIVE    Hepatitis Panel    Latest Ref Rng & Units 03/19/2023    2:01 PM  Hepatitis  Hep B Surface Ag NON-REACTIVE NON-REACTIVE   Hep B IgM NON-REACTIVE NON-REACTIVE    HIV No results found for: "HIV" Immunoglobulins   SPEP    Latest Ref Rng & Units 05/29/2023    1:42 PM  Serum Protein Electrophoresis  Total  Protein 6.0 - 8.3 g/dL 6.8    Chest x-ray: repeat ordered today; last in 2018 during episode of pneumonia  Assessment/Plan:  Reviewed importance of holding Hyrimoz  with signs/symptoms of an infections, if antibiotics are prescribed to treat an active infection, and with invasive procedures  Demonstrated proper injection technique with HYRIMOZ  demo device  Patient able to demonstrate proper injection technique using the teach back method.  Provider injected in the right lower abdomen with:  Sample Medication: Hyrimoz  40mg /0.8mL pen injector NDC: [id number] Lot: [id number] Expiration: 10/2024  Patient tolerated well.  Observed for 30 mins in office for adverse reaction. Patient denies itchiness and irritation at injection., No swelling or redness noted., and Reviewed injection site reaction management with patient verbally and printed information for review in AVS  Patient is to return in 1 month for labs and 6-8 weeks for follow-up appointment.  Standing orders for CBC/CMP placed.  TB gold will be monitored yearly.   Hyrimoz  approved through insurance .   Rx sent to: [facility]: [phone].  Patient provided with pharmacy phone number and advised to call later this week to schedule shipment to home.  Patient will continue Hyrimoz  40mg  subcut every 14 days.  All questions encouraged and answered.  Instructed patient to call with any further questions  or concerns.  Geraldene Kleine, PharmD, MPH, BCPS, CPP Clinical Pharmacist (Rheumatology and Pulmonology)  08/25/2023 10:00 AM

## 2023-08-25 NOTE — Patient Instructions (Signed)
 Your next  HYRIMOZ   dose is due on 09/08/23, 09/22/23, and every 14 days thereafter  HOLD HYRIMOZ  if you have signs or symptoms of an infection. You can resume once you feel better or back to your baseline. HOLD HYRIMOZ  if you start antibiotics to treat an infection. HOLD HYRIMOZ  around the time of surgery/procedures. Your surgeon will be able to provide recommendations on when to hold BEFORE and when you are cleared to RESUME.  Pharmacy information: Your prescription will be shipped from [facility]. Their phone number is [phone] Please call to schedule shipment and confirm address. They will mail your medication to your home.  Labs are due in 1 month then every 3 months. Lab hours are from Monday to Thursday 8am-12:30pm and 1pm-5pm and Friday 8am-12pm. You do not need an appointment if you come for labs during these times. If you'd like to go to a [facility] or [facility] closer to home, please call our clinic 48 hours prior to lab date so we can release orders in a timely manner.  Stay up to date on all routine vaccines: influenza, pneumonia, COVID19, Shingles  How to manage an injection site reaction: Remember the 5 C's: COUNTER - leave on the counter at least 30 minutes but up to overnight to bring medication to room temperature. This may help prevent stinging COLD - place something cold (like an ice gel pack or cold water bottle) on the injection site just before cleansing with alcohol. This may help reduce pain CLARITIN - use Claritin (generic name is loratadine) for the first two weeks of treatment or the day of, the day before, and the day after injecting. This will help to minimize injection site reactions CORTISONE CREAM - apply if injection site is irritated and itching CALL ME - if injection site reaction is bigger than the size of your fist, looks infected, blisters, or if you develop hives

## 2023-08-25 NOTE — Progress Notes (Signed)
 Patient picked up medication with patient label from pharmacy, not a sample.

## 2023-08-25 NOTE — Telephone Encounter (Signed)
 Patient scheduled for Hyrimoz  new start on 08/25/23

## 2023-08-26 ENCOUNTER — Encounter: Payer: Self-pay | Admitting: Family Medicine

## 2023-08-26 LAB — VITAMIN D 25 HYDROXY (VIT D DEFICIENCY, FRACTURES): VITD: 28.85 ng/mL — ABNORMAL LOW (ref 30.00–100.00)

## 2023-08-26 NOTE — Progress Notes (Signed)
 Vitamin D  is improving though still not quite back to goal.  Recommend he continue 5000 IUs daily.  We should recheck in 3 months.

## 2023-08-27 ENCOUNTER — Other Ambulatory Visit

## 2023-08-27 ENCOUNTER — Other Ambulatory Visit: Payer: Self-pay | Admitting: *Deleted

## 2023-08-27 ENCOUNTER — Telehealth: Payer: Self-pay | Admitting: *Deleted

## 2023-08-27 ENCOUNTER — Other Ambulatory Visit: Payer: Self-pay | Admitting: Internal Medicine

## 2023-08-27 DIAGNOSIS — Z79899 Other long term (current) drug therapy: Secondary | ICD-10-CM

## 2023-08-27 DIAGNOSIS — M455 Ankylosing spondylitis of thoracolumbar region: Secondary | ICD-10-CM

## 2023-08-27 NOTE — Telephone Encounter (Signed)
 Copied from CRM [id number]. Topic: Clinical - Medication Question >> Aug 27, 2023  9:54 AM Bambi Bonine D wrote: Reason for CRM: Patient stated that he recently had an appointment with Dr.Parker and was recommended to continue taking the vitamin D  5000 daily. Patient stated that his vitamin d  is set to no refills and would need for Dr.Parker to write him a new prescription for the vitamin D     Spoke with patient, notified Vit D is OTC patient verbalized understanding  Yu Peggs,RMA

## 2023-09-01 ENCOUNTER — Ambulatory Visit: Payer: 59 | Admitting: Family Medicine

## 2023-09-03 ENCOUNTER — Ambulatory Visit: Attending: Internal Medicine

## 2023-09-03 DIAGNOSIS — M5459 Other low back pain: Secondary | ICD-10-CM | POA: Insufficient documentation

## 2023-09-03 DIAGNOSIS — M6281 Muscle weakness (generalized): Secondary | ICD-10-CM | POA: Insufficient documentation

## 2023-09-03 NOTE — Therapy (Signed)
 OUTPATIENT PHYSICAL THERAPY TREATMENT   Patient Name: Alexis Norris MRN: [id number] DOB:12/17/1960, 63 y.o., male Today's Date: 09/03/2023   END OF SESSION:  PT End of Session - 09/03/23 1403     Visit Number 10    Number of Visits 17    Date for PT Re-Evaluation 09/15/23    Authorization Type [employer]    PT Start Time 1404    PT Stop Time 1442    PT Time Calculation (min) 38 min    Activity Tolerance Patient tolerated treatment well    Behavior During Therapy [facility] for tasks assessed/performed                      Past Medical History:  Diagnosis Date   Arthritis    DYSLIPIDEMIA 01/13/2007   no medicines    HYPERTENSION 01/13/2007   HYPOTHYROIDISM, 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   Lumbar back pain    Wrist clicking    Past Surgical History:  Procedure Laterality Date   COLONOSCOPY     I-131 therapy  09/27/1997   maybe 2000-2001   INGUINAL HERNIA REPAIR Right 09/28/2013   Procedure: LAPAROSCOPIC RIGHT  INGUINAL HERNIA;  Surgeon: Shela Derby, MD;  Location: [facility] OR;  Service: General;  Laterality: Right;   INSERTION OF MESH Right 09/28/2013   Procedure: INSERTION OF MESH;  Surgeon: Shela Derby, MD;  Location: [facility] OR;  Service: General;  Laterality: Right;   right knuckle     pin placed in high school   Patient Active Problem List   Diagnosis Date Noted   Bilateral hip pain 01/01/2023   Ankylosing spondylitis (HCC) 11/26/2022   Low back pain 08/01/2022   Vitamin D  deficiency 08/01/2022   Diverticulosis 04/17/2022   GERD (gastroesophageal reflux disease) 04/17/2022   Erectile dysfunction 04/11/2021   Diffuse cutaneous mucinosis 04/10/2020   Gout 02/06/2016   High risk medication use 08/26/2011   Allergic rhinitis 08/26/2011   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 Has been compliant with HEP. Started new medication for AS.   EVAL: Patient reports left lower back pain that he states this past Friday he started having more severe pain and he took a muscle relaxer which did take the pain away. Typically he could get through the day with the pain but over the past few days the pain has gotten much more severe and feels like when he is sitting it is compressing his lower and then when he gets up the pain is worse. The pain is sharp on the left side and occasionally he feel something radiating down the left leg. 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   PERTINENT HISTORY:  See PMH above  PAIN:  Are you having pain? Yes:  NPRS scale: 2/10 at rest, 6-8/10 at worst Pain location: Left lower back Pain description: Sharp Aggravating factors: Sitting and getting up from a seated position, if he I takes a wrong step or moves too fast Relieving factors:  Medication  PRECAUTIONS: None  RED FLAGS: None   WEIGHT BEARING RESTRICTIONS: No  FALLS:  Has patient fallen in last 6 months? 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 PATIENT SURVEYS:  Modified Oswestry 19/50 (38% disability)   COGNITION: Overall cognitive status: Within functional limits for tasks assessed     SENSATION: WFL  MUSCLE LENGTH: Hamstring tightness bilaterally  POSTURE:   Reduced lumbar lordosis, thoracic kyphosis, rounded shoulders  PALPATION: Tender to palpation left  lumbar paraspinals and QL  LUMBAR ROM:   AROM eval  Flexion 25%  Extension < 25%  Right lateral flexion 25%  Left lateral flexion 25%  Right rotation 25%  Left rotation 25%   (Blank rows = not tested)  LOWER EXTREMITY ROM:      Hip ROM grossly WFL  LOWER EXTREMITY MMT:    MMT Right eval Left eval  Hip flexion 4 4-  Hip extension 4- 3  Hip abduction 4 4-  Hip adduction    Hip internal rotation    Hip external rotation    Knee flexion 5 4+  Knee extension 5 4+  Ankle dorsiflexion    Ankle plantarflexion    Ankle inversion    Ankle eversion     (Blank rows = not tested)  LUMBAR SPECIAL TESTS:  Lumbar radicular testing negative  FUNCTIONAL TESTS:  Sit to stand: patient demonstrates slow and hesitant movement when attempting to stand due to expectation of onset of left lower back pain, use of UE for support  GAIT: Assistive device utilized: None Level of assistance: Complete Independence Comments: Grossly WFL   TREATMENT [facility] Adult PT Treatment:                                                DATE: 09/03/2023 NuStep lvl 5 UE/LE x 5 min for functional activity tolerance Lateral walk RTB x 3 laps at counter Standing hip abd/ext 2x10 each Pallof press 2x10 13# Standing chop 2x10 13# each Manual Therapy: Skilled palpation of trigger points for TPDN STM to L lumbar paraspinals Trigger Point Dry Needling:  Subsequent Treatment: Pt instructed on Dry Needling rational, procedures, and possible side effects. Pt instructed to expect mild to moderate muscle soreness later in the day and/or into the next day.  Pt instructed in methods to reduce muscle soreness. Pt instructed to continue prescribed HEP. Because Dry Needling was performed over or adjacent to a lung field, pt was educated on S/S of pneumothorax and to seek immediate medical attention should they occur.  Patient was educated on signs and symptoms of infection and other risk factors and advised to seek medical  attention should they occur.  Patient verbalized understanding of these instructions and education.   Patient Verbal Consent Given: Yes Education Handout Provided: Previously Provided Muscles Treated: Left lumbar paraspinals Electrical Stimulation Performed: Yes, Parameters: Milli x 5 min low freq low intensity; then mirco x 5 min high freq high intensity Treatment Response/Outcome: Patient report no change in symptoms   PATIENT EDUCATION:  Education details: continue HEP Person educated: Patient Education method: Explanation, Demonstration, Tactile cues, Verbal cues, and Handouts Education comprehension: verbalized understanding, returned demonstration, verbal cues required, tactile cues required, and needs further education  HOME EXERCISE PROGRAM: Access Code:  [email or web address]    ASSESSMENT: CLINICAL IMPRESSION: Pt was able to complete prescribed exercises with no adverse effect. Exercises today focused on improving lumbar mobility, lateral hip strength, and core/periscapular endurance in order to decrease pain. Responded well to TPDN and continued use of ESTIM today. Continues to benefit from skilled therapy, will continue to progress as able, will decrease to one visit per week.   EVAL: Patient is a 63 y.o. male who was seen today for physical therapy evaluation and treatment for chronic left lower back pain. He does have a history of ankylosing spondylitis and his currently symptoms are primarily located to the left lumbar region and seem to be more muscular in nature. He exhibit significant limitations with his lumbar mobility, flexibility deficits, and strength deficits of the core/hip musculature that is likely contributing to his pain and decrease in activity tolerance that is impacting his functional ability.   OBJECTIVE IMPAIRMENTS: decreased activity tolerance, decreased ROM, decreased strength, impaired flexibility, postural dysfunction, and pain.   ACTIVITY LIMITATIONS: carrying,  lifting, bending, sitting, standing, squatting, stairs, transfers, and locomotion level  PARTICIPATION LIMITATIONS: driving, community activity, and occupation  PERSONAL FACTORS: Fitness, Past/current experiences, and Time since onset of injury/illness/exacerbation are also affecting patient's functional outcome.   REHAB POTENTIAL: Good  CLINICAL DECISION MAKING: Stable/uncomplicated  EVALUATION COMPLEXITY: Low   GOALS: Goals reviewed with patient? Yes  SHORT TERM GOALS: Target date: 08/18/2023  Patient will be I with initial HEP in order to progress with therapy. Baseline: HEP provided at eval Goal status: MET  2.  Patient will report lower back pain </= 4/10 with activity in order to reduce functional limitations and return to work Baseline: 6-8/10 pain Goal status: MET  3.  Patient will be able to perform sit to stand without hesitancy and good control in order to improve transfers with getting in and out of his work vehicle Baseline: patient with slow and hesitant movement and use of UE for support Goal status: MET  LONG TERM GOALS: Target date: 09/15/2023  Patient will be I with final HEP to maintain progress from PT. Baseline: HEP provided at eval Goal status: INITIAL  2.  Patient will report modified oswestry </= 10/50 (20% disability) in order to indicate an improvement in functional status Baseline: 19/50 (38% disability)  08/20/2023: 18/50 (36% disability) Goal status: IN PROGRESS  3.  Patient will demonstrate gross core and hip strength >/= 4/5 MMT in order to improve his activity tolerance with work related tasks Baseline: see limitations above Goal status: IN PROGRESS  4.  Patient will report lower back pain </= 2/10 with all work related activities and driving in order to reduce functional limitations Baseline: 6-8/10 pain Goal status: IN PROGRESS   PLAN: PT FREQUENCY: 1-2x/week  PT DURATION: 8 weeks  PLANNED INTERVENTIONS: 97164- PT Re-evaluation,  97110-Therapeutic exercises, 97530- Therapeutic activity, 97112- Neuromuscular re-education, 97535- Self Care, 54098- Manual therapy, [id number]- Aquatic Therapy, G0283- Electrical stimulation (unattended), [phone]- Electrical stimulation (manual), Patient/Family education, Taping, Dry Needling, Joint mobilization, Joint manipulation, Spinal manipulation, Spinal mobilization, Cryotherapy, and Moist heat.  PLAN FOR NEXT SESSION: Review HEP and progress PRN, continued with manual/TPDN for left lumbar paraspinals and QL, progress core stabilization and hip strengthening, sit to stand and lifting mechanics/progression   Ivor Mars PT  09/03/23 2:45 PM

## 2023-09-10 ENCOUNTER — Ambulatory Visit

## 2023-09-10 DIAGNOSIS — M5459 Other low back pain: Secondary | ICD-10-CM

## 2023-09-10 DIAGNOSIS — M6281 Muscle weakness (generalized): Secondary | ICD-10-CM

## 2023-09-10 NOTE — Therapy (Signed)
 OUTPATIENT PHYSICAL THERAPY TREATMENT   Patient Name: Kapone Watz MRN: [id number] DOB:[birth year], 63 y.o., male Today's Date: 09/11/2023   END OF SESSION:  PT End of Session - 09/10/23 1530     Visit Number 11    Number of Visits 17    Date for PT Re-Evaluation 09/15/23    Authorization Type [employer]    PT Start Time 1532    PT Stop Time 1612    PT Time Calculation (min) 40 min    Activity Tolerance Patient tolerated treatment well    Behavior During Therapy [facility] for tasks assessed/performed                       Past Medical History:  Diagnosis Date   Arthritis    DYSLIPIDEMIA 01/13/2007   no medicines    HYPERTENSION 01/13/2007   HYPOTHYROIDISM, 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   Lumbar back pain    Wrist clicking    Past Surgical History:  Procedure Laterality Date   COLONOSCOPY     I-131 therapy  09/27/1997   maybe 2000-2001   INGUINAL HERNIA REPAIR Right 09/28/2013   Procedure: LAPAROSCOPIC RIGHT  INGUINAL HERNIA;  Surgeon: Shela Derby, MD;  Location: [facility] OR;  Service: General;  Laterality: Right;   INSERTION OF MESH Right 09/28/2013   Procedure: INSERTION OF MESH;  Surgeon: Shela Derby, MD;  Location: [facility] OR;  Service: General;  Laterality: Right;   right knuckle     pin placed in high school   Patient Active Problem List   Diagnosis Date Noted   Bilateral hip pain 01/01/2023   Ankylosing spondylitis (HCC) 11/26/2022   Low back pain 08/01/2022   Vitamin D  deficiency 08/01/2022   Diverticulosis 04/17/2022   GERD (gastroesophageal reflux disease) 04/17/2022   Erectile dysfunction 04/11/2021   Diffuse cutaneous mucinosis 04/10/2020   Gout 02/06/2016   High risk medication use 08/26/2011   Allergic rhinitis 08/26/2011   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 Has been compliant with HEP.   EVAL: Patient reports left lower back pain that he states this past Friday he started having more severe pain and he took a muscle relaxer which did take the pain away. Typically he could get through the day with the pain but over the past few days the pain has gotten much more severe and feels like when he is sitting it is compressing his lower and then when he gets up the pain is worse. The pain is sharp on the left side and occasionally he feel something radiating down the left leg. 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   PERTINENT HISTORY:  See PMH above  PAIN:  Are you having pain? Yes:  NPRS scale: 2/10 at rest, 6-8/10 at worst Pain location: Left lower back Pain description: Sharp Aggravating factors: Sitting and getting up from a seated position, if he I takes a wrong step or moves too fast Relieving factors: Medication  PRECAUTIONS: None  RED FLAGS: None   WEIGHT BEARING RESTRICTIONS: No  FALLS:  Has patient fallen in last 6 months? 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 PATIENT SURVEYS:  Modified Oswestry 19/50 (38% disability)   COGNITION: Overall cognitive status: Within functional limits for tasks assessed     SENSATION: WFL  MUSCLE LENGTH: Hamstring tightness bilaterally  POSTURE:   Reduced lumbar lordosis, thoracic kyphosis, rounded shoulders  PALPATION: Tender to palpation left lumbar paraspinals  and QL  LUMBAR ROM:   AROM eval  Flexion 25%  Extension < 25%  Right lateral flexion 25%  Left lateral flexion 25%  Right rotation 25%  Left rotation 25%   (Blank rows = not tested)  LOWER EXTREMITY ROM:      Hip ROM grossly WFL  LOWER EXTREMITY MMT:    MMT Right eval Left eval  Hip flexion 4 4-  Hip extension 4- 3  Hip abduction 4 4-  Hip adduction    Hip internal rotation    Hip external rotation    Knee flexion 5 4+  Knee extension 5 4+  Ankle dorsiflexion    Ankle plantarflexion    Ankle inversion    Ankle eversion     (Blank rows = not tested)  LUMBAR SPECIAL TESTS:  Lumbar radicular testing negative  FUNCTIONAL TESTS:  Sit to stand: patient demonstrates slow and hesitant movement when attempting to stand due to expectation of onset of left lower back pain, use of UE for support  GAIT: Assistive device utilized: None Level of assistance: Complete Independence Comments: Grossly WFL   TREATMENT [facility] Adult PT Treatment:                                                DATE: 09/10/2023 NuStep lvl 5 UE/LE x 5 min for functional activity tolerance Lateral walk RTB x 3 laps at counter Standing hip ext 2x10 each RTB Pallof press 2x10 13# Standing FM row 2x10 23# Manual Therapy: Skilled palpation of trigger points for TPDN STM to L lumbar paraspinals and L QL Trigger Point Dry Needling:  Subsequent Treatment: Pt instructed on Dry Needling rational, procedures, and possible side effects. Pt instructed to expect mild to moderate muscle soreness later in the day and/or into the next day.  Pt instructed in methods to reduce muscle soreness. Pt instructed to continue prescribed HEP. Because Dry Needling was performed over or adjacent to a lung field, pt was educated on S/S of pneumothorax and to seek immediate medical attention should they occur.  Patient was educated on signs and symptoms of infection and other risk factors and advised to seek medical attention  should they occur.  Patient verbalized understanding of these instructions and education.   [facility] Adult PT Treatment:                                                DATE: 09/03/2023 NuStep lvl 5 UE/LE x 5 min for functional activity tolerance Lateral walk RTB x 3 laps at counter Standing hip abd/ext 2x10 each Pallof press 2x10 13# Standing chop 2x10 13# each Manual Therapy: Skilled palpation  of trigger points for TPDN STM to L lumbar paraspinals Trigger Point Dry Needling:  Subsequent Treatment: Pt instructed on Dry Needling rational, procedures, and possible side effects. Pt instructed to expect mild to moderate muscle soreness later in the day and/or into the next day.  Pt instructed in methods to reduce muscle soreness. Pt instructed to continue prescribed HEP. Because Dry Needling was performed over or adjacent to a lung field, pt was educated on S/S of pneumothorax and to seek immediate medical attention should they occur.  Patient was educated on signs and symptoms of infection and other risk factors and advised to seek medical attention should they occur.  Patient verbalized understanding of these instructions and education.   Patient Verbal Consent Given: Yes Education Handout Provided: Previously Provided Muscles Treated: Left lumbar paraspinals Electrical Stimulation Performed: Yes, Parameters: Milli x 5 min low freq low intensity; then mirco x 5 min high freq high intensity Treatment Response/Outcome: Patient report no change in symptoms   PATIENT EDUCATION:  Education details: continue HEP Person educated: Patient Education method: Explanation, Demonstration, Tactile cues, Verbal cues, and Handouts Education comprehension: verbalized understanding, returned demonstration, verbal cues required, tactile cues required, and needs further education  HOME EXERCISE PROGRAM: Access Code: [email or web address]    ASSESSMENT: CLINICAL IMPRESSION: Pt was able to complete prescribed  exercises with no adverse effect. Exercises today focused on improving lumbar mobility, lateral hip strength, and core/periscapular endurance in order to decrease pain. Responded well to TPDN and continued use of ESTIM today. Continues to benefit from skilled therapy, will continue to progress as able, will decrease to one visit per week.   EVAL: Patient is a 63 y.o. male who was seen today for physical therapy evaluation and treatment for chronic left lower back pain. He does have a history of ankylosing spondylitis and his currently symptoms are primarily located to the left lumbar region and seem to be more muscular in nature. He exhibit significant limitations with his lumbar mobility, flexibility deficits, and strength deficits of the core/hip musculature that is likely contributing to his pain and decrease in activity tolerance that is impacting his functional ability.   OBJECTIVE IMPAIRMENTS: decreased activity tolerance, decreased ROM, decreased strength, impaired flexibility, postural dysfunction, and pain.   ACTIVITY LIMITATIONS: carrying, lifting, bending, sitting, standing, squatting, stairs, transfers, and locomotion level  PARTICIPATION LIMITATIONS: driving, community activity, and occupation  PERSONAL FACTORS: Fitness, Past/current experiences, and Time since onset of injury/illness/exacerbation are also affecting patient's functional outcome.   REHAB POTENTIAL: Good  CLINICAL DECISION MAKING: Stable/uncomplicated  EVALUATION COMPLEXITY: Low   GOALS: Goals reviewed with patient? Yes  SHORT TERM GOALS: Target date: 08/18/2023  Patient will be I with initial HEP in order to progress with therapy. Baseline: HEP provided at eval Goal status: MET  2.  Patient will report lower back pain </= 4/10 with activity in order to reduce functional limitations and return to work Baseline: 6-8/10 pain Goal status: MET  3.  Patient will be able to perform sit to stand without hesitancy  and good control in order to improve transfers with getting in and out of his work vehicle Baseline: patient with slow and hesitant movement and use of UE for support Goal status: MET  LONG TERM GOALS: Target date: 09/15/2023  Patient will be I with final HEP to maintain progress from PT. Baseline: HEP provided at eval Goal status: INITIAL  2.  Patient will report modified oswestry </= 10/50 (20% disability) in order to indicate an improvement in functional  status Baseline: 19/50 (38% disability)  08/20/2023: 18/50 (36% disability) Goal status: IN PROGRESS  3.  Patient will demonstrate gross core and hip strength >/= 4/5 MMT in order to improve his activity tolerance with work related tasks Baseline: see limitations above Goal status: IN PROGRESS  4.  Patient will report lower back pain </= 2/10 with all work related activities and driving in order to reduce functional limitations Baseline: 6-8/10 pain Goal status: IN PROGRESS   PLAN: PT FREQUENCY: 1-2x/week  PT DURATION: 8 weeks  PLANNED INTERVENTIONS: 97164- PT Re-evaluation, 97110-Therapeutic exercises, 97530- Therapeutic activity, 97112- Neuromuscular re-education, 97535- Self Care, 95621- Manual therapy, [id number]- Aquatic Therapy, G0283- Electrical stimulation (unattended), [phone]- Electrical stimulation (manual), Patient/Family education, Taping, Dry Needling, Joint mobilization, Joint manipulation, Spinal manipulation, Spinal mobilization, Cryotherapy, and Moist heat.  PLAN FOR NEXT SESSION: Review HEP and progress PRN, continued with manual/TPDN for left lumbar paraspinals and QL, progress core stabilization and hip strengthening, sit to stand and lifting mechanics/progression   Ivor Mars PT  09/11/23 8:05 AM

## 2023-09-15 ENCOUNTER — Ambulatory Visit: Payer: Self-pay | Admitting: Sports Medicine

## 2023-09-16 NOTE — Progress Notes (Signed)
 [facility] [address] [place] [address] Phone: [phone]   Assessment and Plan:     1. Ankylosing spondylitis of thoracolumbar region (HCC) (Primary) 2. Chronic bilateral thoracic back pain 3. HLA B27 positive 4. Chronic bilateral low back pain without sciatica 5. Degeneration of intervertebral disc of lumbar region with discogenic back pain -Chronic with exacerbation, subsequent visit - Overall improvement in back pain after completing meloxicam  course for 2 weeks, continuing HEP and physical therapy.  Overall flare of pain has been resolved, and we are now transitioning to maintenance of chronic issues - Start Tylenol  500 to 1000 mg tablets 2-3 times a day for day-to-day pain relief - Continue meloxicam  15 mg daily as needed for breakthrough pain.  Recommend limiting chronic NSAIDs to 1-2 doses per week - Continue HEP and physical therapy - Reviewed patient's MRIs of thoracic and lumbar spine which were overall reassuring.  There were multiple areas in thoracic spine of generalized spondylitic spurring likely related to ankylosing spondylitis with some likely bridging, though overall reassuring the patient does not have large diffuse bridging.  Spondylosis with spurs at L3-L4 and L5-S1.  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 Patient states that pain started in December , felt like his lower back would spasm, pain has progressively gotten worst, advil helps, pain is  radiating down his hamstring and butt, he feels the pain in his spine, no numbness or tingling, intermittent pain when he walks ,    07/24/2022 Patient states doing better than before. Medicine started helping about 3-4 days in. After 2 weeks tried to stop for 2-3 days and pain came back but not as intense. Takes medication when working. Can still feel discomfort in back, but intensity of pain has definitely decreased   08/28/2022 Patient states Pt has been working, has soreness and good day and bad days but spams have decreased    07/25/2023 Patient states phone note 07/23/2023 Patient states that his lower back pain has really flared up and he is having muscle spasms bad. Patient states that Alexis Norris sent him to PT and he went Monday and goes back this Monday.  Patient states that the pain is so bad he does not think he can go back to work tomorrow. His left side really hurts when he moves. He wants to know if he should be out of work . Patient also states that if he lays down it it not that bad but sitting it hurts really bad and at his job he is sitting all the time. He is asking if he should come see Alexis Norris. He states that he took muscle relaxer and it helped but the pain came back and that he has taken Advil with tylenol  but the pain comes back as well.    Pain came back last week. Really bad flare isnt able to sit. Flexeril  helps a little    08/07/2023 Patient states he has had some improvement , but still some flares . Pain  is now moved to thoracic tightness. Wants to know if his floaters are from meds he is taking   09/17/2023 Patient states he is feeling better.    Relevant Historical Information: Hypertension, GERD, hypothyroidism  Additional pertinent review of systems negative.   Current Outpatient Medications:    Colchicine  0.6 MG CAPS, Day 1: Take 2 caps, then 1 cap an hour later. Day 2 and beyond: 1 cap daily, Disp: 30 capsule, Rfl: 0   cyclobenzaprine  (FLEXERIL ) 5 MG tablet,  Take 1 tablet (5 mg total) by mouth at bedtime., Disp: 30 tablet, Rfl: 0   hydrocortisone  (ANUSOL -HC) 2.5 % rectal cream, Place 1 Application rectally 2 (two) times daily., Disp: 30 g, Rfl: 1   HYRIMOZ  40 MG/0.4ML SOAJ, Inject 40 mg into the skin every 14 (fourteen) days. Deliver to patient home, Disp: 2 mL, Rfl: 1   levothyroxine  (SYNTHROID ) 125 MCG tablet, TAKE 2 TABLETS DAILY BEFOREBREAKFAST (Patient taking differently: TAKE 2 TABLETS DAILY BEFORE BREAKFAST Monday-Saturday, 3 tablets daily on sunday), Disp: 180 tablet, Rfl: 0   LORazepam  (ATIVAN ) 0.5 MG tablet, 1-2 tabs 30 - 60 min prior to MRI. Do not drive with this medicine., Disp: 4 tablet, Rfl: 0   losartan  (COZAAR ) 100 MG tablet, Take 1 tablet (100 mg total) by mouth daily., Disp: 100 tablet, Rfl: 3   meloxicam  (MOBIC ) 15 MG tablet, Take 1 tablet (15 mg total) by mouth daily., Disp: 30 tablet, Rfl: 0   SODIUM FLUORIDE 5000 ENAMEL 1.1-5 % GEL, See admin instructions., Disp: , Rfl:    tadalafil  (CIALIS ) 10 MG tablet, Take 1 tablet (10 mg total) by mouth daily., Disp: 90 tablet, Rfl: 1  Current Facility-Administered Medications:    0.9 %  sodium chloride  infusion, 500 mL, Intravenous, Once, Danis, Roel Clarity III, MD   Objective:     Vitals:   09/17/23 1313  Pulse: 81  SpO2: 98%  Weight: 291 lb (132 kg)  Height: 6\' 2"  (1.88 m)      Body mass index is 37.36 kg/m.    Physical Exam:    Gen: Appears well, nad, nontoxic and pleasant Psych: Alert and oriented, appropriate mood and affect Neuro: sensation intact, strength is 5/5 in upper and lower extremities, muscle tone wnl Skin: no susupicious lesions or rashes  Back - Normal skin, Spine with normal alignment and no deformity.   No tenderness to vertebral process palpation.   Bilateral thoracic and lumbar paraspinous muscles are not tender and without spasm NTTP gluteal musculature Straight leg raise negative Trendelenberg negative Piriformis Test negative Gait normal   Occasional mild pain with rotation, though not consistently reproducible  Electronically signed by:  [facility] 1:37 PM 09/17/23

## 2023-09-17 ENCOUNTER — Ambulatory Visit

## 2023-09-17 ENCOUNTER — Ambulatory Visit: Admitting: Sports Medicine

## 2023-09-17 VITALS — HR 81 | Ht 74.0 in | Wt 291.0 lb

## 2023-09-17 DIAGNOSIS — M5459 Other low back pain: Secondary | ICD-10-CM | POA: Diagnosis not present

## 2023-09-17 DIAGNOSIS — M6281 Muscle weakness (generalized): Secondary | ICD-10-CM

## 2023-09-17 DIAGNOSIS — M455 Ankylosing spondylitis of thoracolumbar region: Secondary | ICD-10-CM | POA: Diagnosis not present

## 2023-09-17 DIAGNOSIS — M546 Pain in thoracic spine: Secondary | ICD-10-CM | POA: Diagnosis not present

## 2023-09-17 DIAGNOSIS — G8929 Other chronic pain: Secondary | ICD-10-CM

## 2023-09-17 DIAGNOSIS — Z1589 Genetic susceptibility to other disease: Secondary | ICD-10-CM | POA: Diagnosis not present

## 2023-09-17 DIAGNOSIS — M5136 Other intervertebral disc degeneration, lumbar region with discogenic back pain only: Secondary | ICD-10-CM

## 2023-09-17 NOTE — Patient Instructions (Signed)
 Tylenol  [phone] mg 2-3 times a day for pain relief  Meloxicam  as needed for breakthrough pain limit 1-2 times per week  Continue HEP and PT  As needed follow up

## 2023-09-17 NOTE — Therapy (Signed)
 OUTPATIENT PHYSICAL THERAPY TREATMENT   Patient Name: Alexis Norris MRN: [id number] DOB:11-18-1960, 63 y.o., male Today's Date: 09/18/2023   END OF SESSION:  PT End of Session - 09/17/23 1536     Visit Number 12    Number of Visits 17    Date for PT Re-Evaluation 11/12/23    Authorization Type [employer]    PT Start Time 1534    PT Stop Time 1612    PT Time Calculation (min) 38 min    Activity Tolerance Patient tolerated treatment well    Behavior During Therapy [facility] for tasks assessed/performed                        Past Medical History:  Diagnosis Date   Arthritis    DYSLIPIDEMIA 01/13/2007   no medicines    HYPERTENSION 01/13/2007   HYPOTHYROIDISM, 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   Lumbar back pain    Wrist clicking    Past Surgical History:  Procedure Laterality Date   COLONOSCOPY     I-131 therapy  09/27/1997   maybe 2000-2001   INGUINAL HERNIA REPAIR Right 09/28/2013   Procedure: LAPAROSCOPIC RIGHT  INGUINAL HERNIA;  Surgeon: Shela Derby, MD;  Location: [facility] OR;  Service: General;  Laterality: Right;   INSERTION OF MESH Right 09/28/2013   Procedure: INSERTION OF MESH;  Surgeon: Shela Derby, MD;  Location: [facility] OR;  Service: General;  Laterality: Right;   right knuckle     pin placed in high school   Patient Active Problem List   Diagnosis Date Noted   Bilateral hip pain 01/01/2023   Ankylosing spondylitis (HCC) 11/26/2022   Low back pain 08/01/2022   Vitamin D  deficiency 08/01/2022   Diverticulosis 04/17/2022   GERD (gastroesophageal reflux disease) 04/17/2022   Erectile dysfunction 04/11/2021   Diffuse cutaneous mucinosis 04/10/2020   Gout 02/06/2016   High risk medication use 08/26/2011   Allergic rhinitis 08/26/2011   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  Would like to extend at every other week for a bit and increase his exercise difficulty.   EVAL: Patient reports left lower back pain that he states this past Friday he started having more severe pain and he took a muscle relaxer which did take the pain away. Typically he could get through the day with the pain but over the past few days the pain has gotten much more severe and feels like when he is sitting it is compressing his lower and then when he gets up the pain is worse. The pain is sharp on the left side and occasionally he feel something radiating down the left leg. 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   PERTINENT HISTORY:  See PMH above  PAIN:  Are you having pain? Yes:  NPRS scale: 2/10 at rest, 6-8/10 at worst Pain location: Left lower back Pain description: Sharp Aggravating factors: Sitting and getting up from a seated  position, if he I takes a wrong step or moves too fast Relieving factors: Medication  PRECAUTIONS: None  RED FLAGS: None   WEIGHT BEARING RESTRICTIONS: No  FALLS:  Has patient fallen in last 6 months? 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 PATIENT SURVEYS:  Modified Oswestry 19/50 (38% disability)   COGNITION: Overall cognitive status: Within functional limits for tasks assessed     SENSATION: WFL  MUSCLE LENGTH: Hamstring tightness bilaterally  POSTURE:   Reduced lumbar lordosis, thoracic  kyphosis, rounded shoulders  PALPATION: Tender to palpation left lumbar paraspinals and QL  LUMBAR ROM:   AROM eval  Flexion 25%  Extension < 25%  Right lateral flexion 25%  Left lateral flexion 25%  Right rotation 25%  Left rotation 25%   (Blank rows = not tested)  LOWER EXTREMITY ROM:      Hip ROM grossly WFL  LOWER EXTREMITY MMT:    MMT Right eval Left eval  Hip flexion 4 4-  Hip extension 4- 3  Hip abduction 4 4-  Hip adduction    Hip internal rotation    Hip external rotation    Knee flexion 5 4+  Knee extension 5 4+  Ankle dorsiflexion    Ankle plantarflexion    Ankle inversion    Ankle eversion     (Blank rows = not tested)  LUMBAR SPECIAL TESTS:  Lumbar radicular testing negative  FUNCTIONAL TESTS:  Sit to stand: patient demonstrates slow and hesitant movement when attempting to stand due to expectation of onset of left lower back pain, use of UE for support  GAIT: Assistive device utilized: None Level of assistance: Complete Independence Comments: Grossly WFL   TREATMENT [facility] Adult PT Treatment:                                                DATE: 09/17/2023 NuStep lvl 5 UE/LE x 5 min for functional activity tolerance Pallof press 2x10 13# Standing FM row 2x10 23# Manual Therapy: Skilled palpation of trigger points for TPDN STM to L lumbar paraspinals and L QL Trigger Point Dry Needling:  Subsequent Treatment: Pt instructed on Dry Needling rational, procedures, and possible side effects. Pt instructed to expect mild to moderate muscle soreness later in the day and/or into the next day.  Pt instructed in methods to reduce muscle soreness. Pt instructed to continue prescribed HEP. Because Dry Needling was performed over or adjacent to a lung field, pt was educated on S/S of pneumothorax and to seek immediate medical attention should they occur.  Patient was educated on signs and symptoms of infection and other risk factors and advised to seek  medical attention should they occur.  Patient verbalized understanding of these instructions and education  [facility] Adult PT Treatment:                                                DATE: 09/10/2023 NuStep lvl 5 UE/LE x 5 min for functional activity tolerance Lateral walk RTB x 3 laps at counter Standing hip ext 2x10 each RTB Pallof press 2x10 13# Standing FM row 2x10  23# Manual Therapy: Skilled palpation of trigger points for TPDN STM to L lumbar paraspinals and L QL Trigger Point Dry Needling:  Subsequent Treatment: Pt instructed on Dry Needling rational, procedures, and possible side effects. Pt instructed to expect mild to moderate muscle soreness later in the day and/or into the next day.  Pt instructed in methods to reduce muscle soreness. Pt instructed to continue prescribed HEP. Because Dry Needling was performed over or adjacent to a lung field, pt was educated on S/S of pneumothorax and to seek immediate medical attention should they occur.  Patient was educated on signs and symptoms of infection and other risk factors and advised to seek medical attention should they occur.  Patient verbalized understanding of these instructions and education.   [facility] Adult PT Treatment:                                                DATE: 09/03/2023 NuStep lvl 5 UE/LE x 5 min for functional activity tolerance Lateral walk RTB x 3 laps at counter Standing hip abd/ext 2x10 each Pallof press 2x10 13# Standing chop 2x10 13# each Manual Therapy: Skilled palpation of trigger points for TPDN STM to L lumbar paraspinals Trigger Point Dry Needling:  Subsequent Treatment: Pt instructed on Dry Needling rational, procedures, and possible side effects. Pt instructed to expect mild to moderate muscle soreness later in the day and/or into the next day.  Pt instructed in methods to reduce muscle soreness. Pt instructed to continue prescribed HEP. Because Dry Needling was performed over or adjacent to a  lung field, pt was educated on S/S of pneumothorax and to seek immediate medical attention should they occur.  Patient was educated on signs and symptoms of infection and other risk factors and advised to seek medical attention should they occur.  Patient verbalized understanding of these instructions and education.   Patient Verbal Consent Given: Yes Education Handout Provided: Previously Provided Muscles Treated: Left lumbar paraspinals Electrical Stimulation Performed: Yes, Parameters: Milli x 5 min low freq low intensity; then mirco x 5 min high freq high intensity Treatment Response/Outcome: Patient report no change in symptoms   PATIENT EDUCATION:  Education details: continue HEP Person educated: Patient Education method: Explanation, Demonstration, Tactile cues, Verbal cues, and Handouts Education comprehension: verbalized understanding, returned demonstration, verbal cues required, tactile cues required, and needs further education  HOME EXERCISE PROGRAM: Access Code: [email or web address]    ASSESSMENT: CLINICAL IMPRESSION: Pt was able to complete prescribed exercises with no adverse effect. Exercises today focused on improving lumbar mobility, lateral hip strength, and core/periscapular endurance in order to decrease pain. Responded well to TPDN and continued use of ESTIM today. Continues to benefit from skilled therapy, will continue to progress as able, will decrease to one visit every other week and increase exercise intensity to improve functioning post ankylosing spondylitis diagnosis.    EVAL: Patient is a 63 y.o. male who was seen today for physical therapy evaluation and treatment for chronic left lower back pain. He does have a history of ankylosing spondylitis and his currently symptoms are primarily located to the left lumbar region and seem to be more muscular in nature. He exhibit significant limitations with his lumbar mobility, flexibility deficits, and strength deficits of the  core/hip musculature that is likely contributing to his pain and decrease in activity tolerance that is impacting his functional  ability.   OBJECTIVE IMPAIRMENTS: decreased activity tolerance, decreased ROM, decreased strength, impaired flexibility, postural dysfunction, and pain.   ACTIVITY LIMITATIONS: carrying, lifting, bending, sitting, standing, squatting, stairs, transfers, and locomotion level  PARTICIPATION LIMITATIONS: driving, community activity, and occupation  PERSONAL FACTORS: Fitness, Past/current experiences, and Time since onset of injury/illness/exacerbation are also affecting patient's functional outcome.   REHAB POTENTIAL: Good  CLINICAL DECISION MAKING: Stable/uncomplicated  EVALUATION COMPLEXITY: Low   GOALS: Goals reviewed with patient? Yes  SHORT TERM GOALS: Target date: 08/18/2023  Patient will be I with initial HEP in order to progress with therapy. Baseline: HEP provided at eval Goal status: MET  2.  Patient will report lower back pain </= 4/10 with activity in order to reduce functional limitations and return to work Baseline: 6-8/10 pain Goal status: MET  3.  Patient will be able to perform sit to stand without hesitancy and good control in order to improve transfers with getting in and out of his work vehicle Baseline: patient with slow and hesitant movement and use of UE for support Goal status: MET  LONG TERM GOALS: Target date: 11/12/2023  Patient will be I with final HEP to maintain progress from PT. Baseline: HEP provided at eval Goal status: INITIAL  2.  Patient will report modified oswestry </= 10/50 (20% disability) in order to indicate an improvement in functional status Baseline: 19/50 (38% disability)  08/20/2023: 18/50 (36% disability) 09/17/2023: 13/50 (26% disability) Goal status: IN PROGRESS  3.  Patient will demonstrate gross core and hip strength >/= 4/5 MMT in order to improve his activity tolerance with work related  tasks Baseline: see limitations above Goal status: IN PROGRESS  4.  Patient will report lower back pain </= 2/10 with all work related activities and driving in order to reduce functional limitations Baseline: 6-8/10 pain Goal status: IN PROGRESS   PLAN: PT FREQUENCY: 1-2x/week  PT DURATION: 8 weeks  PLANNED INTERVENTIONS: 97164- PT Re-evaluation, 97110-Therapeutic exercises, 97530- Therapeutic activity, 97112- Neuromuscular re-education, 97535- Self Care, [address]- Manual therapy, [id number]- Aquatic Therapy, [id number]- Electrical stimulation (unattended), [id number]- Electrical stimulation (manual), Patient/Family education, Taping, Dry Needling, Joint mobilization, Joint manipulation, Spinal manipulation, Spinal mobilization, Cryotherapy, and Moist heat.  PLAN FOR NEXT SESSION: Review HEP and progress PRN, continued with manual/TPDN for left lumbar paraspinals and QL, progress core stabilization and hip strengthening, sit to stand and lifting mechanics/progression   Ivor Mars PT  09/18/23 10:16 AM

## 2023-09-17 NOTE — Progress Notes (Signed)
 Office Visit Note  Patient: Alexis Norris             Date of Birth: Sep 24, 1960           MRN: [id number]             PCP: Rodney Clamp, MD Referring: Rodney Clamp, MD Visit Date: 10/01/2023   Subjective:  No chief complaint on file.   Discussed the use of AI scribe software for clinical note transcription with the patient, who gave verbal consent to proceed.  History of Present Illness   Alexis Norris is a 63 year old male with ankylosing spondylitis who presents for follow-up on his treatment with Hyrimoz  injections 40 mg [place] q14days.  He started Hyrimoz  injections in April and has received three injections so far, with the fourth one due next weekend. He reports feeling a little better, with less morning stiffness and improved ability to navigate stairs, although he still experiences occasional back soreness and stiffness. An MRI showed less bridging syndesmophytes than initially suggested by x-ray. He has two levels in the lumbar spine fused by bone spurring, but most of the spine is not fused.  He experiences some reflux and stomach upset, which he associates with taking vitamin D  supplements. He takes a daily dose of 5000 IU in soft gel form. He also notes a change in bowel regularity, with occasional scant blood in the tissue, which he attributes to dietary factors and occasional alcohol consumption.  He has noticed small bumps on his skin that itch slightly and bleed when scratched, but they scab over and resolve. He plans to discuss these skin changes with his dermatologist in August.  He reports some swelling in his left ankle, which has been larger than the right for some time, requiring a change in shoe size. He has a history of physical therapy for back issues, which previously exacerbated his symptoms.       Previous HPI 06/25/2023 Alexis Norris is a 63 y.o. male here for follow up for ankylosing spondylitis.  Lab testing at previous visit did show mild  elevation in sedimentation rate is unclear how much clinical benefit he is getting from sulfasalazine  to discontinue this so now off for maybe around 2 months.  X-ray last time also demonstrated definite ankylosis throughout the thoracic spine although some was due to multilevel degenerative disease.   He experiences increased morning stiffness and back pain, primarily on the left side, recurring over the past couple of weeks. The stiffness improves with movement, and he notes a twinge in his back, especially in the mornings.   He has been off sulfasalazine  for at least one to two months and associates the return of morning stiffness with discontinuation of the medication.   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 He does not perform stretches or exercises regularly, although he has previously been referred to physical therapy, which he found beneficial.   No visible swelling or significant pain in his feet and ankles, although he mentions occasional soreness, particularly after sitting for extended periods or consuming different foods. He describes a sensation of numbness in his skin, which worsens under these conditions. No significant pain when pressure is applied to his back but experiences a twinge when moving. He also reports feeling stiffness in his hamstrings when attempting to touch his toes, but no pain or twinge in his back during  this movement.       Previous HPI 03/19/2023 Alexis Norris is a 63 y.o. 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  Right hand and wrist symptoms have been very well-controlled just gets occasional pain with use related for his work.  Has noticed worsening the past few months pain in the middle of his back.  This is most severe at night and first thing  in the morning.  Often awakens him from sleep.  Symptoms improve walking throughout the day he does notice a bit worse of forward bending after working.   Previous HPI 01/01/2023 Alexis Norris is a 63 y.o. 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  He started taking 1 tablet twice daily for a week after last visit then increased it to 2 tablets twice daily.  He noticed improvement in his right wrist pain and stiffness as well as back pain.  Still has a few minutes of stiffness waking up every morning.  He has noticed some new achiness affecting both hips feels this is most similar to the soreness after exercising but is not associated with any change in activity.  Also having some pain and stiffness at the right side of his neck with a little more trouble looking side-to-side and feels a nontender nodule in the area.   Previous HPI 11/26/2022 Alexis Norris is a 63 y.o. male here for follow up for inflammatory arthritis concern for possible ankylosing spondylitis versus seronegative inflammatory arthritis with previous history of positive HLA-B27 and Graves' disease and elevated sedimentation rate.  Exam at our initial visit was consistent with active tenosynovitis of the right wrist.  X-ray negative for any local erosions or demineralization.  Lab test showed mild persistent elevated sed rate positive at 24 but this is much improved compared to previous levels.  Since then his wrist is doing slightly better but remains swollen.  He is noticing low back pain and stiffness for a few minutes each morning.  Also thinks he has some increase in the pain and stiffness usually notices getting out of his truck.  No change in the chronic lower leg swelling on both sides.  He was scheduled for updated colonoscopy in October due to scant bright red blood per rectum.   Previous HPI 10/24/22 Alexis Norris is a 63  y.o. male here for evaluation of chronic joint pain in multiple areas associated elevated sedimentation rate and positive HLA-B27.  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  He has a chronic history of some pain and swelling affecting feet and ankles that was previously diagnosed as gout more than 5 years ago.  He was on treatment for this including allopurinol  and colchicine  but without significant impact on symptoms and if you never had any elevated uric acid blood testing monosodium urate crystals.  This treatment was discontinued a few months ago so far without any major flare.  He has had chronic low back pain for years pretty mild not significantly impacting activities or causing frequent nighttime awakening.  This has gotten a little bit worse over time but in the past few months experienced worsening evaluated sports medicine clinic treated with meloxicam  and as needed Flexeril .  He noticed a significant benefit in his joint inflammation when on the meloxicam .  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  Oral NSAIDs and wrist brace quickly improve this but since stopping the meloxicam  back to regularly as he has increased swelling again.  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  On review of previous chest x-ray imaging from 2018 also shows pretty extensive thoracic spine anterior osteophyte processes.  Not currently on any specific medication for joint inflammation has a few minutes of stiffness every morning.  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 Had concern reported for some exophthalmos related to Graves' disease was referred to ophthalmology not yet seen the specialist for  this.   Review of Systems  Constitutional:  Negative for fatigue.  HENT:  Negative for mouth sores and mouth dryness.   Eyes:  Negative for dryness.  Respiratory:  Negative for shortness of breath.   Cardiovascular:  Negative for chest pain and palpitations.  Gastrointestinal:  Positive for blood in stool. Negative for constipation and diarrhea.  Endocrine: Negative for increased urination.  Genitourinary:  Negative for involuntary urination.  Musculoskeletal:  Positive for joint pain, joint pain, joint swelling and morning stiffness. Negative for gait problem, myalgias, muscle weakness, muscle tenderness and myalgias.  Skin:  Negative for color change, rash, hair loss and sensitivity to sunlight.  Allergic/Immunologic: Negative for susceptible to infections.  Neurological:  Negative for dizziness and headaches.  Hematological:  Negative for swollen glands.  Psychiatric/Behavioral:  Positive for sleep disturbance. Negative for depressed mood. The patient is not nervous/anxious.     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   Dyslipidemia 01/13/2007   Essential hypertension 01/13/2007    Past Medical History:  Diagnosis Date   Arthritis    DYSLIPIDEMIA 01/13/2007   no medicines    HYPERTENSION 01/13/2007   HYPOTHYROIDISM, 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   Lumbar back pain    Wrist clicking     Family History  Problem Relation Age of Onset   Cancer Mother    Heart attack Father    Thyroid  cancer Sister    Colon cancer Neg  Hx    Liver disease Neg Hx    Esophageal cancer Neg Hx    Colon polyps Neg Hx    Rectal cancer Neg Hx    Stomach cancer Neg Hx    Past Surgical History:  Procedure Laterality Date   COLONOSCOPY     I-131 therapy  09/27/1997   maybe 2000-2001   INGUINAL HERNIA REPAIR Right 09/28/2013   Procedure: LAPAROSCOPIC RIGHT  INGUINAL HERNIA;  Surgeon: Shela Derby, MD;  Location: [facility] OR;  Service: General;  Laterality: Right;   INSERTION OF MESH Right 09/28/2013   Procedure: INSERTION OF MESH;  Surgeon: Shela Derby, MD;  Location: [facility] OR;  Service: General;  Laterality: Right;   right knuckle     pin placed in high school   Social History   Social History Narrative   Works Lobbyist History  Administered Date(s) Administered   Moderna Sars-Covid-2 Vaccination 07/09/2019, 08/09/2019, 04/28/2020   Td 06/28/2002   Tdap 04/10/2020     Objective: Vital Signs: BP 138/75 (BP Location: Left Arm, Patient Position: Sitting, Cuff Size: Large)   Pulse 89   Resp 14   Ht 6' 1 (1.854 m)   Wt 291 lb (132 kg)   BMI 38.39 kg/m    Physical Exam  Eyes:     Conjunctiva/sclera: Conjunctivae normal.    Cardiovascular:     Rate and Rhythm: Normal rate and regular rhythm.  Pulmonary:     Effort: Pulmonary effort is normal.     Breath sounds: Normal breath sounds.   Skin:    General: Skin is warm and dry.     Findings: Rash present.     Comments: Chronic skin thickening and hyperpigmentation in both lower extremities    Neurological:     Mental Status: He is alert.   Psychiatric:        Mood and Affect: Mood normal.      Musculoskeletal Exam:  Shoulders full ROM no tenderness or swelling Elbows full ROM no tenderness or swelling Wrists full ROM no tenderness or swelling Fingers full ROM no tenderness or swelling No localized tenderness to pressure midline or paraspinal muscles, significantly decreased range of motion in lection extension and lateral rotation  throughout thoracolumbar spine region Hip internal rotation/FADIR limited with some pain in lateral hip and going to low back Knees full ROM no tenderness or swelling  Investigation: No additional findings.  Imaging: No results found.   Recent Labs: Lab Results  Component Value Date   WBC 6.3 05/29/2023   HGB 13.8 05/29/2023   PLT 255.0 05/29/2023   NA 142 05/29/2023   K 3.8 05/29/2023   CL 108 05/29/2023   CO2 25 05/29/2023   GLUCOSE 84 05/29/2023   BUN 10 05/29/2023   CREATININE 0.85 05/29/2023   BILITOT 0.5 05/29/2023   ALKPHOS 80 05/29/2023   AST 12 05/29/2023   ALT 7 05/29/2023   PROT 6.8 05/29/2023   ALBUMIN 4.4 05/29/2023   CALCIUM 9.1 05/29/2023   GFRAA >90 09/24/2013   QFTBGOLDPLUS NEGATIVE 03/19/2023    Speciality Comments: No specialty comments available.  Procedures:  No procedures performed Allergies: Simvastatin   Assessment / Plan:     Visit Diagnoses: Ankylosing spondylitis of thoracolumbar region [facility]) - Plan: HYRIMOZ  40 MG/0.4ML SOAJ, Sedimentation rate, DISCONTINUED: HYRIMOZ  40 MG/0.4ML SOAJ MRI shows fewer bridging syndesmophytes than x-ray, indicating less ankylosis. Hyrimoz  injections since April show some improvement. Evaluating effectiveness as full response takes 3-6 months. Discussed side effects, inflammation marker monitoring, and JAK inhibitors if needed. Current treatment appropriate given cardiovascular risks. - Continue Hyrimoz  injections. - Monitor inflammation markers and blood tests. - Consider JAK inhibitors if Hyrimoz  is ineffective or not tolerated.  High risk medication use - Hyrimoz  40 mg into the skin every fourteen days. - Plan: HYRIMOZ  40 MG/0.4ML SOAJ, CBC with Differential/Platelet, Comprehensive metabolic panel with GFR, DISCONTINUED: HYRIMOZ  40 MG/0.4ML SOAJ So far tolerating medication no particular injection reactions no serious interval infections. - Checking CBC and CMP for medication monitoring on continued  Hyrimoz   Cutaneous mucinosis Small bumps possibly related to Hyrimoz  injections. No folliculitis or skin cancer. Dermatology follow-up scheduled. - Monitor skin lesions for changes. - Follow up with dermatology in August.  Gastroesophageal reflux disease (GERD) Mild reflux possibly related to vitamin D  soft gels. Discussed alternative vitamin D  forms. - Switch vitamin D  supplementation to gummies  or tablets.  Vitamin D  deficiency Taking 5000 IU vitamin D  soft gels daily. Reports mild reflux possibly related to soft gels. - Switch vitamin D  supplementation to gummies or tablets.   Orders: Orders Placed This Encounter  Procedures   Sedimentation rate   CBC with Differential/Platelet   Comprehensive metabolic panel with GFR   Meds ordered this encounter  Medications   DISCONTD: HYRIMOZ  40 MG/0.4ML SOAJ    Sig: Inject 40 mg into the skin every 14 (fourteen) days. Deliver to patient home    Dispense:  2 mL    Refill:  1    Deliver to patient home   HYRIMOZ  40 MG/0.4ML SOAJ    Sig: Inject 40 mg into the skin every 14 (fourteen) days. Deliver to patient home    Dispense:  2 mL    Refill:  1    Deliver to patient home     Follow-Up Instructions: Return in about 2 months (around 12/01/2023) for AS on ADA f/u 2-63mos.   Matt Song, MD  Note - This record has been created using [employer].  Chart creation errors have been sought, but may not always  have been located. Such creation errors do not reflect on  the standard of medical care.

## 2023-10-01 ENCOUNTER — Encounter: Payer: Self-pay | Admitting: Internal Medicine

## 2023-10-01 ENCOUNTER — Ambulatory Visit: Payer: 59 | Attending: Internal Medicine | Admitting: Internal Medicine

## 2023-10-01 VITALS — BP 138/75 | HR 89 | Resp 14 | Ht 73.0 in | Wt 291.0 lb

## 2023-10-01 DIAGNOSIS — M455 Ankylosing spondylitis of thoracolumbar region: Secondary | ICD-10-CM

## 2023-10-01 DIAGNOSIS — Z79899 Other long term (current) drug therapy: Secondary | ICD-10-CM

## 2023-10-01 MED ORDER — HYRIMOZ 40 MG/0.4ML ~~LOC~~ SOAJ: 40.0000 mg | SUBCUTANEOUS | 1 refills | Status: DC

## 2023-10-02 LAB — CBC WITH DIFFERENTIAL/PLATELET
Absolute Lymphocytes: 1436 {cells}/uL (ref 850–3900)
Absolute Monocytes: 456 {cells}/uL (ref 200–950)
Basophils Absolute: 42 {cells}/uL (ref 0–200)
Basophils Relative: 0.8 %
Eosinophils Absolute: 307 {cells}/uL (ref 15–500)
Eosinophils Relative: 5.8 %
HCT: 42.2 % (ref 38.5–50.0)
Hemoglobin: 13.7 g/dL (ref 13.2–17.1)
MCH: 27.1 pg (ref 27.0–33.0)
MCHC: 32.5 g/dL (ref 32.0–36.0)
MCV: 83.4 fL (ref 80.0–100.0)
MPV: 9.4 fL (ref 7.5–12.5)
Monocytes Relative: 8.6 %
Neutro Abs: 3058 {cells}/uL (ref 1500–7800)
Neutrophils Relative %: 57.7 %
Platelets: 207 10*3/uL (ref 140–400)
RBC: 5.06 10*6/uL (ref 4.20–5.80)
RDW: 14 % (ref 11.0–15.0)
Total Lymphocyte: 27.1 %
WBC: 5.3 10*3/uL (ref 3.8–10.8)

## 2023-10-02 LAB — COMPREHENSIVE METABOLIC PANEL WITH GFR
AG Ratio: 1.6 (calc) (ref 1.0–2.5)
ALT: 9 U/L (ref 9–46)
AST: 13 U/L (ref 10–35)
Albumin: 4.2 g/dL (ref 3.6–5.1)
Alkaline phosphatase (APISO): 74 U/L (ref 35–144)
BUN: 13 mg/dL (ref 7–25)
CO2: 26 mmol/L (ref 20–32)
Calcium: 8.7 mg/dL (ref 8.6–10.3)
Chloride: 108 mmol/L (ref 98–110)
Creat: 0.95 mg/dL (ref 0.70–1.35)
Globulin: 2.6 g/dL (ref 1.9–3.7)
Glucose, Bld: 88 mg/dL (ref 65–99)
Potassium: 3.9 mmol/L (ref 3.5–5.3)
Sodium: 141 mmol/L (ref 135–146)
Total Bilirubin: 0.6 mg/dL (ref 0.2–1.2)
Total Protein: 6.8 g/dL (ref 6.1–8.1)
eGFR: 92 mL/min/{1.73_m2} (ref >=60)

## 2023-10-02 LAB — SEDIMENTATION RATE: Sed Rate: 14 mm/h (ref 0–20)

## 2023-10-06 ENCOUNTER — Ambulatory Visit: Attending: Internal Medicine

## 2023-10-06 DIAGNOSIS — M5459 Other low back pain: Secondary | ICD-10-CM | POA: Diagnosis present

## 2023-10-06 DIAGNOSIS — M6281 Muscle weakness (generalized): Secondary | ICD-10-CM | POA: Insufficient documentation

## 2023-10-06 NOTE — Therapy (Signed)
 OUTPATIENT PHYSICAL THERAPY TREATMENT   Patient Name: Alexis Norris MRN: [id number] DOB:[birth year], 63 y.o., male Today's Date: 10/06/2023   END OF SESSION:  PT End of Session - 10/06/23 1531     Visit Number 13    Number of Visits 17    Date for PT Re-Evaluation 11/12/23    Authorization Type [employer]    PT Start Time 1531    PT Stop Time 1611    PT Time Calculation (min) 40 min    Activity Tolerance Patient tolerated treatment well    Behavior During Therapy [facility] for tasks assessed/performed                         Past Medical History:  Diagnosis Date   Arthritis    DYSLIPIDEMIA 01/13/2007   no medicines    HYPERTENSION 01/13/2007   HYPOTHYROIDISM, 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   Lumbar back pain    Wrist clicking    Past Surgical History:  Procedure Laterality Date   COLONOSCOPY     I-131 therapy  09/27/1997   maybe 2000-2001   INGUINAL HERNIA REPAIR Right 09/28/2013   Procedure: LAPAROSCOPIC RIGHT  INGUINAL HERNIA;  Surgeon: Shela Derby, MD;  Location: [facility] OR;  Service: General;  Laterality: Right;   INSERTION OF MESH Right 09/28/2013   Procedure: INSERTION OF MESH;  Surgeon: Shela Derby, MD;  Location: [facility] OR;  Service: General;  Laterality: Right;   right knuckle     pin placed in high school   Patient Active Problem List   Diagnosis Date Noted   Bilateral hip pain 01/01/2023   Ankylosing spondylitis (HCC) 11/26/2022   Low back pain 08/01/2022   Vitamin D  deficiency 08/01/2022   Diverticulosis 04/17/2022   GERD (gastroesophageal reflux disease) 04/17/2022   Erectile dysfunction 04/11/2021   Diffuse cutaneous mucinosis 04/10/2020   Gout 02/06/2016   High risk medication use 08/26/2011   Allergic rhinitis 08/26/2011   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   Dyslipidemia 01/13/2007   Essential hypertension 01/13/2007    PCP: Rodney Clamp, MD  REFERRING PROVIDER: Matt Song, MD  REFERRING DIAG:  Ankylosing spondylitis of thoracolumbar region; Chronic bilateral low back pain, unspecified whether sciatica present  Rationale for Evaluation and Treatment: Rehabilitation  THERAPY DIAG:  Other low back pain  Muscle weakness (generalized)  ONSET DATE: Chronic   SUBJECTIVE:           SUBJECTIVE STATEMENT: Pt presents to PT with reports of slight L sided LBP but otherwise is doing well. Has been compliant with HEP.  EVAL: Patient reports left lower back pain that he states this past Friday he started having more severe pain and he took a muscle relaxer which did take the pain away. Typically he could get through the day with the pain but over the past few days the pain has gotten much more severe and feels like when he is sitting it is compressing his lower and then when he gets up the pain is worse. The pain is sharp on the left side and occasionally he feel something radiating down the left leg. 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   PERTINENT HISTORY:  See PMH above  PAIN:  Are you having pain? Yes:  NPRS scale: 2/10 at rest, 6-8/10 at worst Pain location: Left lower back Pain description: Sharp Aggravating factors: Sitting and getting up from a seated position, if he I takes a wrong step or moves too  fast Relieving factors: Medication  PRECAUTIONS: None  RED FLAGS: None   WEIGHT BEARING RESTRICTIONS: No  FALLS:  Has patient fallen in last 6 months? 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 PATIENT SURVEYS:  Modified Oswestry 19/50 (38% disability)   COGNITION: Overall cognitive status: Within functional limits for tasks assessed     SENSATION: WFL  MUSCLE LENGTH: Hamstring tightness bilaterally  POSTURE:   Reduced lumbar lordosis, thoracic kyphosis, rounded shoulders  PALPATION: Tender to palpation left  lumbar paraspinals and QL  LUMBAR ROM:   AROM eval  Flexion 25%  Extension < 25%  Right lateral flexion 25%  Left lateral flexion 25%  Right rotation 25%  Left rotation 25%   (Blank rows = not tested)  LOWER EXTREMITY ROM:      Hip ROM grossly WFL  LOWER EXTREMITY MMT:    MMT Right eval Left eval  Hip flexion 4 4-  Hip extension 4- 3  Hip abduction 4 4-  Hip adduction    Hip internal rotation    Hip external rotation    Knee flexion 5 4+  Knee extension 5 4+  Ankle dorsiflexion    Ankle plantarflexion    Ankle inversion    Ankle eversion     (Blank rows = not tested)  LUMBAR SPECIAL TESTS:  Lumbar radicular testing negative  FUNCTIONAL TESTS:  Sit to stand: patient demonstrates slow and hesitant movement when attempting to stand due to expectation of onset of left lower back pain, use of UE for support  GAIT: Assistive device utilized: None Level of assistance: Complete Independence Comments: Grossly WFL   TREATMENT [facility] Adult PT Treatment:                                                DATE: 10/06/2023 NuStep lvl 5 UE/LE x 4 min for functional activity tolerance Bridge 2x10 GTB S/L clamshell 2x15 GTB Pilates SLR 2x10 Seated hamstring stretch 2x30" each Pallof press 2x10 13# Standing FM row 2x10 23#  PATIENT EDUCATION:  Education details: continue HEP Person educated: Patient Education method: Explanation, Demonstration, Tactile cues, Verbal cues, and Handouts Education comprehension: verbalized understanding, returned demonstration, verbal cues required, tactile cues required, and needs further education  HOME EXERCISE PROGRAM: Access Code: [email or web address] URL: [email or web address] Date: 10/06/2023 Prepared by: Loral Roch  Exercises - Supine Bridge with Resistance Band  - 3 x weekly - 3 sets - 10 reps - green band hold - Clamshell with Resistance  - 3 x weekly - 2 sets - 15 reps - green band hold - Supine Lower Trunk Rotation  - 2 x  daily - 10 reps - 5 seconds hold - Seated Hamstring Stretch  - 2 x daily - 3 reps - 30 seconds hold - Standing Shoulder Row with Anchored Resistance  - 1 x daily - 7 x weekly - 3 sets - 10 reps - black band hold   ASSESSMENT: CLINICAL IMPRESSION: Pt was able to complete prescribed exercises today with no adverse effect. HEP was updated for progression of core and hip strengthening. Exercises focused on improving proximal hip, core, and postural muscle strength in order to decrease pain. Pt is progressing well with therapy, will see how he does on upcoming trip in  terms of pain and need to continue with skilled services.   EVAL: Patient is a 63 y.o. male who was seen today for physical therapy evaluation and treatment for chronic left lower back pain. He does have a history of ankylosing spondylitis and his currently symptoms are primarily located to the left lumbar region and seem to be more muscular in nature. He exhibit significant limitations with his lumbar mobility, flexibility deficits, and strength deficits of the core/hip musculature that is likely contributing to his pain and decrease in activity tolerance that is impacting his functional ability.   OBJECTIVE IMPAIRMENTS: decreased activity tolerance, decreased ROM, decreased strength, impaired flexibility, postural dysfunction, and pain.   ACTIVITY LIMITATIONS: carrying, lifting, bending, sitting, standing, squatting, stairs, transfers, and locomotion level  PARTICIPATION LIMITATIONS: driving, community activity, and occupation  PERSONAL FACTORS: Fitness, Past/current experiences, and Time since onset of injury/illness/exacerbation are also affecting patient's functional outcome.   REHAB POTENTIAL: Good  CLINICAL DECISION MAKING: Stable/uncomplicated  EVALUATION COMPLEXITY: Low   GOALS: Goals reviewed with patient? Yes  SHORT TERM GOALS: Target date: 08/18/2023  Patient will be I with initial HEP in order to progress with  therapy. Baseline: HEP provided at eval Goal status: MET  2.  Patient will report lower back pain </= 4/10 with activity in order to reduce functional limitations and return to work Baseline: 6-8/10 pain Goal status: MET  3.  Patient will be able to perform sit to stand without hesitancy and good control in order to improve transfers with getting in and out of his work vehicle Baseline: patient with slow and hesitant movement and use of UE for support Goal status: MET  LONG TERM GOALS: Target date: 11/12/2023  Patient will be I with final HEP to maintain progress from PT. Baseline: HEP provided at eval Goal status: INITIAL  2.  Patient will report modified oswestry </= 10/50 (20% disability) in order to indicate an improvement in functional status Baseline: 19/50 (38% disability)  08/20/2023: 18/50 (36% disability) 09/17/2023: 13/50 (26% disability) Goal status: IN PROGRESS  3.  Patient will demonstrate gross core and hip strength >/= 4/5 MMT in order to improve his activity tolerance with work related tasks Baseline: see limitations above Goal status: IN PROGRESS  4.  Patient will report lower back pain </= 2/10 with all work related activities and driving in order to reduce functional limitations Baseline: 6-8/10 pain Goal status: IN PROGRESS   PLAN: PT FREQUENCY: 1-2x/week  PT DURATION: 8 weeks  PLANNED INTERVENTIONS: 97164- PT Re-evaluation, 97110-Therapeutic exercises, 97530- Therapeutic activity, 97112- Neuromuscular re-education, 97535- Self Care, 40981- Manual therapy, [id number]- Aquatic Therapy, [id number]- Electrical stimulation (unattended), [phone]- Electrical stimulation (manual), Patient/Family education, Taping, Dry Needling, Joint mobilization, Joint manipulation, Spinal manipulation, Spinal mobilization, Cryotherapy, and Moist heat.  PLAN FOR NEXT SESSION: Review HEP and progress PRN, continued with manual/TPDN for left lumbar paraspinals and QL, progress core stabilization  and hip strengthening, sit to stand and lifting mechanics/progression   Ivor Mars PT  10/06/23 4:25 PM

## 2023-10-10 ENCOUNTER — Ambulatory Visit: Payer: Self-pay

## 2023-10-10 ENCOUNTER — Telehealth: Payer: Self-pay | Admitting: *Deleted

## 2023-10-10 ENCOUNTER — Telehealth: Payer: Self-pay | Admitting: Family Medicine

## 2023-10-10 NOTE — Telephone Encounter (Signed)
 Copied from CRM [id number]. Topic: Clinical - Red Word Triage >> Oct 10, 2023 10:41 AM Pam Bode wrote: Red Word that prompted transfer to Nurse Triage: Patient is taking a new medication doesn't know if it causing the aches and pain, patient took dayquil as well , he think it could be an infection. The aches in hip started Tuesday but Thursday was getting worst.

## 2023-10-10 NOTE — Telephone Encounter (Signed)
 FYI Only or Action Required?: Action required by provider  Patient was last seen in primary care on 05/29/2023 by Rodney Clamp, MD. Called Nurse Triage reporting Hip Pain, medication question, Generalized Body Aches, Nasal Congestion, and Cough. Symptoms began several days ago. Interventions attempted: OTC medications: dayquil and Rest, hydration, or home remedies. Symptoms are: gradually worsening.  Triage Disposition: Call PCP Within 24 Hours  Patient/caregiver understands and will follow disposition?: Yes - no PCP availability, advised UC in meantime, pt requesting call back from PCP       Copied from CRM [id number]. Topic: Clinical - Red Word Triage >> Oct 10, 2023 10:41 AM Pam Bode wrote: Red Word that prompted transfer to Nurse Triage: Patient is taking a new medication doesn't know if it causing the aches and pain, patient took dayquil as well , he think it could be an infection. The aches in hip started Tuesday but Thursday was getting worst. Reason for Disposition  Diabetes mellitus or weak immune system (e.g., HIV positive, cancer chemo, splenectomy, organ transplant, chronic steroids)  Answer Assessment - Initial Assessment Questions 1. LOCATION and RADIATION: Where is the pain located?      Both hips, kind of feels like hamstring inner thigh too, outer side too 2. QUALITY: What does the pain feel like?  (e.g., sharp, dull, aching, burning)     Soreness, dull ache 3. SEVERITY: How bad is the pain? What does it keep you from doing?   (Scale 1-10; or mild, moderate, severe)   -  MILD (1-3): doesn't interfere with normal activities    -  MODERATE (4-7): interferes with normal activities (e.g., work or school) or awakens from sleep, limping    -  SEVERE (8-10): excruciating pain, unable to do any normal activities, unable to walk     2-3/10, more of a soreness than a pain, like body aches 4. ONSET: When did the pain start? Does it come and go, or is it there all the  time?     Tuesday, thought from PT 6. CAUSE: What do you think is causing the hip pain?      Concerned about new med lowering immune system 7. AGGRAVATING FACTORS: What makes the hip pain worse? (e.g., walking, climbing stairs, running)     Working as Advertising account planner 8. OTHER SYMPTOMS: Do you have any other symptoms? (e.g., back pain, pain shooting down leg,  fever, rash)     Body aches since Tuesday night, cough, runny nose, can hear in throat it's deeper, can bring up some congestion for a while, not sure what color the sputum is, wet cough, yesterday felt little bit warm, never took temp, not really chills, no rash redness or swelling Soreness had PT then had body aches like beginning of sickness yesterday, cough, runny nose, woke up feeling like shouldn't go to work, hear it in my throat, took dayquil now at work Taking new med, kind of knocks out my immune system, rheum said call PCP Normally no big deal except med I'm on and don't want it to turn into something more No numbness or tingling  Advised pt exam within 24 hours, no PCP availability today, advised UC, requesting call back from PCP in meantime with further recommendations  Protocols used: Hip Pain-A-AH, Muscle Aches and Body Pain-A-AH

## 2023-10-10 NOTE — Telephone Encounter (Signed)
 Opened triage encounter for pt, see other nurse note for 6/13.

## 2023-10-10 NOTE — Telephone Encounter (Signed)
 Patient has an OV with PCP on 10/13/2023

## 2023-10-10 NOTE — Telephone Encounter (Signed)
 Patient contacted the office and states he had physical therapy on Monday. Patient states the next day he starting feeling achy. Patient states the following day he had body aches and a sore throat. Patient is on Hyrimoz . Patient advised he should contact his PCP or seek evaluation to rule out infection. Patient advised to hold Hyrimoz  until his symptoms have resolved and he has completed antibiotics if he is prescribed any. Patient expressed understanding.

## 2023-10-13 ENCOUNTER — Encounter: Payer: Self-pay | Admitting: Family Medicine

## 2023-10-13 ENCOUNTER — Ambulatory Visit: Admitting: Family Medicine

## 2023-10-13 VITALS — BP 129/75 | HR 77 | Temp 98.2°F | Ht 73.0 in | Wt 295.8 lb

## 2023-10-13 DIAGNOSIS — E559 Vitamin D deficiency, unspecified: Secondary | ICD-10-CM

## 2023-10-13 DIAGNOSIS — E890 Postprocedural hypothyroidism: Secondary | ICD-10-CM

## 2023-10-13 DIAGNOSIS — J329 Chronic sinusitis, unspecified: Secondary | ICD-10-CM

## 2023-10-13 DIAGNOSIS — R059 Cough, unspecified: Secondary | ICD-10-CM

## 2023-10-13 DIAGNOSIS — I10 Essential (primary) hypertension: Secondary | ICD-10-CM

## 2023-10-13 LAB — POC COVID19 BINAXNOW: SARS Coronavirus 2 Ag: NEGATIVE

## 2023-10-13 MED ORDER — AMOXICILLIN-POT CLAVULANATE 875-125 MG PO TABS: 1.0000 | ORAL_TABLET | Freq: Two times a day (BID) | ORAL | 0 refills | Status: DC

## 2023-10-13 NOTE — Progress Notes (Signed)
   Natnael Cashwell is a 63 y.o. male who presents today for an office visit.  Assessment/Plan:  New/Acute Problems: Sinusitis  No red flags. Covid test negative.  Likely had viral URI.  May have had exaggerated response due to recently starting biologic agent for his ankylosing spondylitis.  It is reassuring that his symptoms have begun to improve the last few days.  Encouraged hydration.  He can use over-the-counter meds as needed.  Will send a pocket prescription for Augmentin with instruction not start unless symptoms worsen or fail to improve in the next few days.  We discussed reasons to return to care.  Chronic Problems Addressed Today: Vitamin D  deficiency On vitamin D  5000 IUs daily.  Recheck next blood draw.  Hypothyroidism following radioiodine therapy Along with endocrinology.  On Synthroid  224 mcg daily.  Has upcoming appointment for labs.  Essential hypertension At goal today on losartan  100 mg daily.     Subjective:  HPI:  See Assessment / plan for status of chronic conditions. Patient here with cough and congestion for a week. He was initially having some chills and body aches. Symptoms have improved over the last couple of days. No chest pain or shortness of breath.  Wife has been sick with similar symptoms.       Objective:  Physical Exam: BP 129/75   Pulse 77   Temp 98.2 F (36.8 C) (Temporal)   Ht 6' 1 (1.854 m)   Wt 295 lb 12.8 oz (134.2 kg)   SpO2 98%   BMI 39.03 kg/m   Gen: No acute distress, resting comfortably HEENT: TMs clear. CV: Regular rate and rhythm with no murmurs appreciated Pulm: Normal work of breathing, clear to auscultation bilaterally with no crackles, wheezes, or rhonchi Neuro: Grossly normal, moves all extremities Psych: Normal affect and thought content      Akayla Brass M. Daneil Dunker, MD 10/13/2023 2:56 PM

## 2023-10-13 NOTE — Patient Instructions (Signed)
 It was very nice to see you today!  Your COVID test is negative.  Your symptoms should continue to improve.  Start the antibiotic if you have any worsening symptoms.  Return if symptoms worsen or fail to improve.   Take care, Dr Daneil Dunker  PLEASE NOTE:  If you had any lab tests, please let us  know if you have not heard back within a few days. You may see your results on mychart before we have a chance to review them but we will give you a call once they are reviewed by us .   If we ordered any referrals today, please let us  know if you have not heard from their office within the next week.   If you had any urgent prescriptions sent in today, please check with the pharmacy within an hour of our visit to make sure the prescription was transmitted appropriately.   Please try these tips to maintain a healthy lifestyle:  Eat at least 3 REAL meals and 1-2 snacks per day.  Aim for no more than 5 hours between eating.  If you eat breakfast, please do so within one hour of getting up.   Each meal should contain half fruits/vegetables, one quarter protein, and one quarter carbs (no bigger than a computer mouse)  Cut down on sweet beverages. This includes juice, soda, and sweet tea.   Drink at least 1 glass of water with each meal and aim for at least 8 glasses per day  Exercise at least 150 minutes every week.

## 2023-10-13 NOTE — Assessment & Plan Note (Signed)
 At goal today on losartan 100 mg daily.

## 2023-10-13 NOTE — Assessment & Plan Note (Signed)
 Along with endocrinology.  On Synthroid  224 mcg daily.  Has upcoming appointment for labs.

## 2023-10-13 NOTE — Assessment & Plan Note (Signed)
 On vitamin D  5000 IUs daily.  Recheck next blood draw.

## 2023-10-20 ENCOUNTER — Ambulatory Visit

## 2023-10-20 DIAGNOSIS — M5459 Other low back pain: Secondary | ICD-10-CM

## 2023-10-20 DIAGNOSIS — M6281 Muscle weakness (generalized): Secondary | ICD-10-CM

## 2023-10-20 NOTE — Therapy (Signed)
 OUTPATIENT PHYSICAL THERAPY TREATMENT   Patient Name: Alexis Norris MRN: [id number] DOB:20-May-1960, 63 y.o., male Today's Date: 10/21/2023   END OF SESSION:  PT End of Session - 10/20/23 1527     Visit Number 14    Number of Visits 17    Date for PT Re-Evaluation 11/12/23    Authorization Type [employer]    PT Start Time 1530    PT Stop Time 1612    PT Time Calculation (min) 42 min    Activity Tolerance Patient tolerated treatment well    Behavior During Therapy [facility] for tasks assessed/performed                       Past Medical History:  Diagnosis Date   Arthritis    DYSLIPIDEMIA 01/13/2007   no medicines    HYPERTENSION 01/13/2007   HYPOTHYROIDISM, 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   Lumbar back pain    Wrist clicking    Past Surgical History:  Procedure Laterality Date   COLONOSCOPY     I-131 therapy  09/27/1997   maybe 2000-2001   INGUINAL HERNIA REPAIR Right 09/28/2013   Procedure: LAPAROSCOPIC RIGHT  INGUINAL HERNIA;  Surgeon: Lynda Leos, MD;  Location: [facility] OR;  Service: General;  Laterality: Right;   INSERTION OF MESH Right 09/28/2013   Procedure: INSERTION OF MESH;  Surgeon: Lynda Leos, MD;  Location: [facility] OR;  Service: General;  Laterality: Right;   right knuckle     pin placed in high school   Patient Active Problem List   Diagnosis Date Noted   Bilateral hip pain 01/01/2023   Ankylosing spondylitis (HCC) 11/26/2022   Low back pain 08/01/2022   Vitamin D  deficiency 08/01/2022   Diverticulosis 04/17/2022   GERD (gastroesophageal reflux disease) 04/17/2022   Erectile dysfunction 04/11/2021   Diffuse cutaneous mucinosis 04/10/2020   Gout 02/06/2016   High risk medication use 08/26/2011   Allergic rhinitis 08/26/2011   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 Had only minimal pain during 11 hour drive.   EVAL: Patient reports left lower back pain that he states this past Friday he started having more severe pain and he took a muscle relaxer which did take the pain away. Typically he could get through the day with the pain but over the past few days the pain has gotten much more severe and feels like when he is sitting it is compressing his lower and then when he gets up the pain is worse. The pain is sharp on the left side and occasionally he feel something radiating down the left leg. 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   PERTINENT HISTORY:  See PMH above  PAIN:  Are you having pain? Yes:  NPRS scale: 2/10 at rest, 6-8/10 at worst Pain location: Left lower back Pain description: Sharp Aggravating factors: Sitting and getting up from a seated position, if he I takes a wrong step or moves  too fast Relieving factors: Medication  PRECAUTIONS: None  RED FLAGS: None   WEIGHT BEARING RESTRICTIONS: No  FALLS:  Has patient fallen in last 6 months? 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 PATIENT SURVEYS:  Modified Oswestry 19/50 (38% disability)   COGNITION: Overall cognitive status: Within functional limits for tasks assessed     SENSATION: WFL  MUSCLE LENGTH: Hamstring tightness bilaterally  POSTURE:   Reduced lumbar lordosis, thoracic kyphosis, rounded shoulders  PALPATION: Tender to  palpation left lumbar paraspinals and QL  LUMBAR ROM:   AROM eval  Flexion 25%  Extension < 25%  Right lateral flexion 25%  Left lateral flexion 25%  Right rotation 25%  Left rotation 25%   (Blank rows = not tested)  LOWER EXTREMITY ROM:      Hip ROM grossly WFL  LOWER EXTREMITY MMT:    MMT Right eval Left eval  Hip flexion 4 4-  Hip extension 4- 3  Hip abduction 4 4-  Hip adduction    Hip internal rotation    Hip external rotation    Knee flexion 5 4+  Knee extension 5 4+  Ankle dorsiflexion    Ankle plantarflexion    Ankle inversion    Ankle eversion     (Blank rows = not tested)  LUMBAR SPECIAL TESTS:  Lumbar radicular testing negative  FUNCTIONAL TESTS:  Sit to stand: patient demonstrates slow and hesitant movement when attempting to stand due to expectation of onset of left lower back pain, use of UE for support  GAIT: Assistive device utilized: None Level of assistance: Complete Independence Comments: Grossly WFL   TREATMENT [facility] Adult PT Treatment:                                                DATE: 10/20/2023 NuStep lvl 5 UE/LE x 4 min for functional activity tolerance Bridge 2x10 blue band S/L clamshell 2x10 blue band Pilates SLR 2x10 90/90 hold 2x15 Seated hamstring stretch 2x30 each Pallof press 2x10 13# Standing FM row 2x10 23#  [facility] Adult PT Treatment:                                                DATE: 10/06/2023 NuStep lvl 5 UE/LE x 4 min for functional activity tolerance Bridge 2x10 GTB S/L clamshell 2x15 GTB Pilates SLR 2x10 Seated hamstring stretch 2x30 each Pallof press 2x10 13# Standing FM row 2x10 23#  PATIENT EDUCATION:  Education details: continue HEP Person educated: Patient Education method: Explanation, Demonstration, Tactile cues, Verbal cues, and Handouts Education comprehension: verbalized understanding, returned demonstration, verbal cues required, tactile cues required, and needs further education  HOME EXERCISE  PROGRAM: Access Code: [email or web address] URL: [email or web address] Date: 10/21/2023 Prepared by: Alm Kingdom  Exercises - Supine Bridge with Resistance Band  - 3 x weekly - 3 sets - 10 reps - green band hold - Clamshell with Resistance  - 3 x weekly - 2 sets - 15 reps - green band hold - Supine Lower Trunk Rotation  - 2 x daily - 10 reps - 5 seconds hold - Seated Hamstring Stretch  -  2 x daily - 3 reps - 30 seconds hold - Standing Shoulder Row with Anchored Resistance  - 1 x daily - 7 x weekly - 3 sets - 10 reps - black band hold - Supine 90/90 Abdominal Bracing  - 1 x daily - 7 x weekly - 2 sets - 15 sec hold   ASSESSMENT: CLINICAL IMPRESSION: Pt was able to complete all prescribed exercises with no adverse effect. Exercises continued to progress core and proximal hip strength with HEP updated for increasing core isometric strength. Overall he has progressed very well, will see him back in 3 weeks with possible discharge if symptoms continue to do well.   EVAL: Patient is a 63 y.o. male who was seen today for physical therapy evaluation and treatment for chronic left lower back pain. He does have a history of ankylosing spondylitis and his currently symptoms are primarily located to the left lumbar region and seem to be more muscular in nature. He exhibit significant limitations with his lumbar mobility, flexibility deficits, and strength deficits of the core/hip musculature that is likely contributing to his pain and decrease in activity tolerance that is impacting his functional ability.   OBJECTIVE IMPAIRMENTS: decreased activity tolerance, decreased ROM, decreased strength, impaired flexibility, postural dysfunction, and pain.   ACTIVITY LIMITATIONS: carrying, lifting, bending, sitting, standing, squatting, stairs, transfers, and locomotion level  PARTICIPATION LIMITATIONS: driving, community activity, and occupation  PERSONAL FACTORS: Fitness, Past/current experiences, and  Time since onset of injury/illness/exacerbation are also affecting patient's functional outcome.   REHAB POTENTIAL: Good  CLINICAL DECISION MAKING: Stable/uncomplicated  EVALUATION COMPLEXITY: Low   GOALS: Goals reviewed with patient? Yes  SHORT TERM GOALS: Target date: 08/18/2023  Patient will be I with initial HEP in order to progress with therapy. Baseline: HEP provided at eval Goal status: MET  2.  Patient will report lower back pain </= 4/10 with activity in order to reduce functional limitations and return to work Baseline: 6-8/10 pain Goal status: MET  3.  Patient will be able to perform sit to stand without hesitancy and good control in order to improve transfers with getting in and out of his work vehicle Baseline: patient with slow and hesitant movement and use of UE for support Goal status: MET  LONG TERM GOALS: Target date: 11/12/2023  Patient will be I with final HEP to maintain progress from PT. Baseline: HEP provided at eval Goal status: INITIAL  2.  Patient will report modified oswestry </= 10/50 (20% disability) in order to indicate an improvement in functional status Baseline: 19/50 (38% disability)  08/20/2023: 18/50 (36% disability) 09/17/2023: 13/50 (26% disability) Goal status: IN PROGRESS  3.  Patient will demonstrate gross core and hip strength >/= 4/5 MMT in order to improve his activity tolerance with work related tasks Baseline: see limitations above Goal status: IN PROGRESS  4.  Patient will report lower back pain </= 2/10 with all work related activities and driving in order to reduce functional limitations Baseline: 6-8/10 pain Goal status: IN PROGRESS   PLAN: PT FREQUENCY: 1-2x/week  PT DURATION: 8 weeks  PLANNED INTERVENTIONS: 97164- PT Re-evaluation, 97110-Therapeutic exercises, 97530- Therapeutic activity, 97112- Neuromuscular re-education, 97535- Self Care, 02859- Manual therapy, [id number]- Aquatic Therapy, G0283- Electrical stimulation  (unattended), [id number]- Electrical stimulation (manual), Patient/Family education, Taping, Dry Needling, Joint mobilization, Joint manipulation, Spinal manipulation, Spinal mobilization, Cryotherapy, and Moist heat.  PLAN FOR NEXT SESSION: Review HEP and progress PRN, continued with manual/TPDN for left lumbar paraspinals and QL, progress core stabilization and  hip strengthening, sit to stand and lifting mechanics/progression   Alm JAYSON Kingdom PT  10/21/23 11:41 AM

## 2023-11-03 ENCOUNTER — Ambulatory Visit: Admitting: Family Medicine

## 2023-11-12 ENCOUNTER — Ambulatory Visit: Payer: Self-pay | Attending: Internal Medicine

## 2023-11-12 DIAGNOSIS — M5459 Other low back pain: Secondary | ICD-10-CM | POA: Diagnosis present

## 2023-11-12 DIAGNOSIS — M6281 Muscle weakness (generalized): Secondary | ICD-10-CM | POA: Diagnosis present

## 2023-11-12 NOTE — Therapy (Signed)
 OUTPATIENT PHYSICAL THERAPY TREATMENT/DISCHARGE  PHYSICAL THERAPY DISCHARGE SUMMARY  Visits from Start of Care: 15  Current functional level related to goals / functional outcomes: See goals and objective   Remaining deficits: See goals and objective   Education / Equipment: HEP   Patient agrees to discharge. Patient goals were met. Patient is being discharged due to meeting the stated rehab goals.   Patient Name: Alexis Norris MRN: [id number] DOB:1960/12/24, 63 y.o., male Today's Date: 11/13/2023   END OF SESSION:  PT End of Session - 11/12/23 1444     Visit Number 15    Number of Visits 17    Date for PT Re-Evaluation 11/12/23    Authorization Type [employer]et[employer]    PT Start Time 1445    PT Stop Time 1525    PT Time Calculation (min) 40 min    Activity Tolerance Patient tolerated treatment well    Behavior During Therapy [facility] for tasks assessed/performed                        Past Medical History:  Diagnosis Date   Arthritis    DYSLIPIDEMIA 01/13/2007   no medicines    HYPERTENSION 01/13/2007   HYPOTHYROIDISM, 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   Lumbar back pain    Wrist clicking    Past Surgical History:  Procedure Laterality Date   COLONOSCOPY     I-131 therapy  09/27/1997   maybe 2000-2001   INGUINAL HERNIA REPAIR Right 09/28/2013   Procedure: LAPAROSCOPIC RIGHT  INGUINAL HERNIA;  Surgeon: Lynda Leos, MD;  Location: [facility] OR;  Service: General;  Laterality: Right;   INSERTION OF MESH Right 09/28/2013   Procedure: INSERTION OF MESH;  Surgeon: Lynda Leos, MD;  Location: [facility] OR;  Service: General;  Laterality: Right;   right knuckle     pin placed in high school   Patient Active Problem List   Diagnosis Date Noted   Bilateral hip pain 01/01/2023   Ankylosing spondylitis (HCC) 11/26/2022   Low back pain 08/01/2022   Vitamin D  deficiency 08/01/2022   Diverticulosis 04/17/2022   GERD (gastroesophageal reflux disease) 04/17/2022   Erectile  dysfunction 04/11/2021   Diffuse cutaneous mucinosis 04/10/2020   Gout 02/06/2016   High risk medication use 08/26/2011   Allergic rhinitis 08/26/2011   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 Has been compliant with HEP.   EVAL: Patient reports left lower back pain that he states this past Friday he started having more severe pain and he took a muscle relaxer which did take the pain away. Typically he could get through the day with the pain but over the past few days the pain has gotten much more severe and feels like when he is sitting it is compressing his lower and then when he gets up the pain is worse. The pain is sharp on the left side and occasionally he feel something radiating down the left leg. 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   PERTINENT HISTORY:  See PMH above  PAIN:  Are you having pain? Yes:  NPRS scale: 2/10 at rest, 6-8/10 at worst Pain location: Left lower back Pain description: Sharp Aggravating factors: Sitting and getting up from a seated position, if he I takes a wrong step or moves too fast Relieving factors: Medication  PRECAUTIONS: None  RED FLAGS: None   WEIGHT BEARING RESTRICTIONS: No  FALLS:  Has patient fallen in last 6 months? 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 PATIENT SURVEYS:  Modified Oswestry 19/50 (38% disability)  11/12/2023: 12/50  COGNITION: Overall cognitive status: Within functional limits for tasks assessed     SENSATION: WFL  MUSCLE LENGTH: Hamstring tightness bilaterally  POSTURE:   Reduced lumbar lordosis, thoracic kyphosis, rounded shoulders  PALPATION: Tender to palpation left lumbar paraspinals and QL  LUMBAR ROM:   AROM eval  Flexion 25%  Extension < 25%  Right lateral flexion 25%  Left lateral flexion 25%  Right rotation 25%  Left rotation 25%   (Blank rows = not tested)  LOWER EXTREMITY ROM:      Hip ROM grossly WFL  LOWER EXTREMITY MMT:    MMT Right eval Left eval  Hip flexion 4 4-  Hip extension 4- 3  Hip abduction 4 4-  Hip adduction    Hip internal rotation    Hip external rotation    Knee flexion 5 4+  Knee extension 5 4+  Ankle dorsiflexion    Ankle plantarflexion    Ankle inversion    Ankle eversion     (Blank rows = not tested)  LUMBAR SPECIAL TESTS:  Lumbar radicular testing negative  FUNCTIONAL TESTS:  Sit to stand: patient demonstrates slow and hesitant movement when attempting to stand due to expectation of onset of left lower back pain, use of UE for support  GAIT: Assistive device utilized: None Level of assistance: Complete Independence Comments: Grossly WFL   TREATMENT [facility] Adult PT Treatment:                                                DATE: 11/12/2023 NuStep lvl 5 UE/LE x 4 min for functional activity tolerance Bridge 2x10 blue band S/L clamshell 2x10 blue band Pilates SLR 2x10 90/90 hold 2x15 Seated hamstring stretch 2x30 each Pallof press 2x10 13# Standing FM row 2x10 23# Review of tests/measures, goals, and outcomes for discharge  [facility] Adult PT Treatment:                                                DATE: 10/20/2023 NuStep lvl 5 UE/LE x 4 min for functional activity tolerance Bridge 2x10 blue band S/L clamshell 2x10  blue band SLR x 15 90/90 hold 2x15 Seated hamstring stretch 2x30 each Pallof press 2x10 black band Standing row 2x15 black band  [facility] Adult PT Treatment:  DATE: 10/06/2023 NuStep lvl 5 UE/LE x 4 min for functional activity tolerance Bridge 2x10 GTB S/L clamshell 2x15 GTB Pilates SLR 2x10 Seated hamstring stretch 2x30 each Pallof press 2x10 13# Standing FM row 2x10 23#  PATIENT EDUCATION:  Education details: continue HEP Person educated: Patient Education method: Explanation, Demonstration, Tactile cues, Verbal cues, and Handouts Education comprehension: verbalized understanding, returned demonstration, verbal cues required, tactile cues required, and needs further education  HOME EXERCISE PROGRAM: Access Code: [email or web address] URL: [email or web address] Date: 10/21/2023 Prepared by: Alm Kingdom  Exercises - Supine Bridge with Resistance Band  - 3 x weekly - 3 sets - 10 reps - green band hold - Clamshell with Resistance  - 3 x weekly - 2 sets - 15 reps - green band hold - Supine Lower Trunk Rotation  - 2 x daily - 10 reps - 5 seconds hold - Seated Hamstring Stretch  - 2 x daily - 3 reps - 30 seconds hold - Standing Shoulder Row with Anchored Resistance  - 1 x daily - 7 x weekly - 3 sets - 10 reps - black band hold - Supine 90/90 Abdominal Bracing  - 1 x daily - 7 x weekly - 2 sets - 15 sec hold   ASSESSMENT: CLINICAL IMPRESSION: Pt was able to complete all prescribed exercises with no adverse effect and demonstrated knowledge of HEP with no adverse effect. Over course of PT treatment he has progressed very well, showing improved lumbar mobility and strength. Decreased ODI score shows improved subjective functional ability with home ADLs. Pt should continue to improve with HEP compliance and is ready to discharge at this time.   EVAL: Patient is a 63 y.o. male who was seen today for physical therapy evaluation and treatment  for chronic left lower back pain. He does have a history of ankylosing spondylitis and his currently symptoms are primarily located to the left lumbar region and seem to be more muscular in nature. He exhibit significant limitations with his lumbar mobility, flexibility deficits, and strength deficits of the core/hip musculature that is likely contributing to his pain and decrease in activity tolerance that is impacting his functional ability.   OBJECTIVE IMPAIRMENTS: decreased activity tolerance, decreased ROM, decreased strength, impaired flexibility, postural dysfunction, and pain.   ACTIVITY LIMITATIONS: carrying, lifting, bending, sitting, standing, squatting, stairs, transfers, and locomotion level  PARTICIPATION LIMITATIONS: driving, community activity, and occupation  PERSONAL FACTORS: Fitness, Past/current experiences, and Time since onset of injury/illness/exacerbation are also affecting patient's functional outcome.   REHAB POTENTIAL: Good  CLINICAL DECISION MAKING: Stable/uncomplicated  EVALUATION COMPLEXITY: Low   GOALS: Goals reviewed with patient? Yes  SHORT TERM GOALS: Target date: 08/18/2023  Patient will be I with initial HEP in order to progress with therapy. Baseline: HEP provided at eval Goal status: MET  2.  Patient will report lower back pain </= 4/10 with activity in order to reduce functional limitations and return to work Baseline: 6-8/10 pain Goal status: MET  3.  Patient will be able to perform sit to stand without hesitancy and good control in order to improve transfers with getting in and out of his work vehicle Baseline: patient with slow and hesitant movement and use of UE for support Goal status: MET  LONG TERM GOALS: Target date: 11/12/2023  Patient will be I with final HEP to maintain progress from PT. Baseline: HEP provided at eval Goal status: MET  2.  Patient will report modified oswestry </= 10/50 (20% disability) in order to indicate an  improvement in functional status Baseline: 19/50 (38% disability)  08/20/2023: 18/50 (36% disability) 09/17/2023: 13/50 (26% disability) 11/12/2023: 12/50 (24% disability) Goal status: MOSTLY MET  3.  Patient will demonstrate gross core and hip strength >/= 4/5 MMT in order to improve his activity tolerance with work related tasks Baseline: see limitations above Goal status: MET  4.  Patient will report lower back pain </= 2/10 with all work related activities and driving in order to reduce functional limitations Baseline: 6-8/10 pain Goal status: MET   PLAN: PT FREQUENCY: 1-2x/week  PT DURATION: 8 weeks  PLANNED INTERVENTIONS: 97164- PT Re-evaluation, 97110-Therapeutic exercises, 97530- Therapeutic activity, 97112- Neuromuscular re-education, 97535- Self Care, 02859- Manual therapy, [id number]- Aquatic Therapy, G0283- Electrical stimulation (unattended), [phone]- Electrical stimulation (manual), Patient/Family education, Taping, Dry Needling, Joint mobilization, Joint manipulation, Spinal manipulation, Spinal mobilization, Cryotherapy, and Moist heat.  PLAN FOR NEXT SESSION: Review HEP and progress PRN, continued with manual/TPDN for left lumbar paraspinals and QL, progress core stabilization and hip strengthening, sit to stand and lifting mechanics/progression   Alm JAYSON Kingdom PT  11/13/23 9:36 AM

## 2023-11-13 ENCOUNTER — Telehealth: Payer: Self-pay | Admitting: Family Medicine

## 2023-11-13 NOTE — Telephone Encounter (Signed)
 Ok with me. Please place any necessary orders.

## 2023-11-13 NOTE — Telephone Encounter (Signed)
 Patient is scheduled for a 3 month fu on 7/23.  Patient would like to know if he could come in for labs prior if needed?

## 2023-11-13 NOTE — Telephone Encounter (Signed)
**Note De-identified  [place] Obfuscation** Please advise 

## 2023-11-19 ENCOUNTER — Encounter

## 2023-11-19 DIAGNOSIS — E559 Vitamin D deficiency, unspecified: Secondary | ICD-10-CM

## 2023-11-19 LAB — VITAMIN D 25 HYDROXY (VIT D DEFICIENCY, FRACTURES): VITD: 42.45 ng/mL (ref 30.00–100.00)

## 2023-11-19 NOTE — Addendum Note (Signed)
 Addended by: DAYNE SHERRY RAMAN on: 11/19/2023 09:43 AM   Modules accepted: Level of Service

## 2023-11-21 ENCOUNTER — Ambulatory Visit: Payer: Self-pay | Admitting: Family Medicine

## 2023-11-21 NOTE — Progress Notes (Signed)
 Vitamin D  is at goal.  Recommend he continue maintenance 1000 to 2000 IUs daily and we can recheck in 6 to 12 months.

## 2023-11-25 ENCOUNTER — Ambulatory Visit: Payer: Self-pay | Admitting: Internal Medicine

## 2023-11-25 ENCOUNTER — Other Ambulatory Visit: Payer: Self-pay | Admitting: Internal Medicine

## 2023-11-25 DIAGNOSIS — Z79899 Other long term (current) drug therapy: Secondary | ICD-10-CM

## 2023-11-25 DIAGNOSIS — M455 Ankylosing spondylitis of thoracolumbar region: Secondary | ICD-10-CM

## 2023-11-25 NOTE — Telephone Encounter (Signed)
 Last Fill: 10/01/2023  Labs: 10/01/2023 WNL  TB Gold: 03/19/2023 Negative   Next Visit: 12/03/2023  Last Visit: 10/01/2023  DX:Ankylosing spondylitis of thoracolumbar region [facility])   Current Dose per office note 10/01/2023: Hyrimoz  40 mg into the skin every fourteen days   Okay to refill Hyrimoz ?

## 2023-11-25 NOTE — Progress Notes (Signed)
 Sed rate improved to 14 which is normal now much better than previous.  Blood counts and metabolic panel were normal with no problem for continuing the Hyrimoz 

## 2023-11-27 NOTE — Progress Notes (Signed)
 Office Visit Note  Patient: Alexis Norris             Date of Birth: 1961/02/10           MRN: [id number]             PCP: Kennyth Worth HERO, MD Referring: Kennyth Worth HERO, MD Visit Date: 12/03/2023   Subjective:  Follow-up (Discuss Hyirmoz, started Zebound)   Discussed the use of AI scribe software for clinical note transcription with the patient, who gave verbal consent to proceed.  History of Present Illness   Alexis Norris is a 63 y.o. male here for follow up with ankylosing spondylitis who presents for follow-up on his treatment with Hyrimoz  injections 40 mg [place] q14days.    He has been on Zepbound for approximately five to six weeks. The medication is working, but not as dramatically as he had anticipated. He notes a weight loss of about ten pounds, attributing it partially to the medication and partially to removing his shoes during weigh-ins. He is adjusting to the medication's effect on his appetite, stating that it makes him less hungry, which is challenging as he is used to eating on a schedule. He is trying to ensure he maintains adequate protein intake to prevent muscle loss.  He has noticed little bumps on his legs occasionally. There have been no significant changes in his skin condition, although he mentions a slight increase in skin issues compared to six months ago.  He experiences occasional stomach upset and diarrhea, which he associates with his diet and the medication's effects. He also mentions difficulty sleeping through the night, often waking up and eating late at night. He has not been tested for sleep apnea but finds it hard to sleep seven to eight hours straight.  He has noticed some dryness around his eyes and has been seen by a specialist for this. He was advised about potential thyroid  eye disease, but at the time, he did not have significant symptoms to warrant treatment. He plans to follow up with the specialist in a few months.  He has been on  Hyrimoz  injections for an unspecified duration and is unsure if this is a long-term treatment. He has not noticed significant side effects from the medication, aside from possible skin issues. He completed physical therapy two weeks ago, which focused on strengthening his core and improving hip flexibility. He is trying to maintain the exercises independently.      Previous HPI 10/01/2023 Alexis Norris is a 63 year old male with ankylosing spondylitis who presents for follow-up on his treatment with Hyrimoz  injections 40 mg [place] q14days.   He started Hyrimoz  injections in April and has received three injections so far, with the fourth one due next weekend. He reports feeling a little better, with less morning stiffness and improved ability to navigate stairs, although he still experiences occasional back soreness and stiffness. An MRI showed less bridging syndesmophytes than initially suggested by x-ray. He has two levels in the lumbar spine fused by bone spurring, but most of the spine is not fused.   He experiences some reflux and stomach upset, which he associates with taking vitamin D  supplements. He takes a daily dose of 5000 IU in soft gel form. He also notes a change in bowel regularity, with occasional scant blood in the tissue, which he attributes to dietary factors and occasional alcohol consumption.   He has noticed small bumps on his skin that itch slightly and bleed when scratched,  but they scab over and resolve. He plans to discuss these skin changes with his dermatologist in August.   He reports some swelling in his left ankle, which has been larger than the right for some time, requiring a change in shoe size. He has a history of physical therapy for back issues, which previously exacerbated his symptoms.         Previous HPI 06/25/2023 Alexis Norris is a 63 y.o. male here for follow up for ankylosing spondylitis.  Lab testing at previous visit did show mild elevation in  sedimentation rate is unclear how much clinical benefit he is getting from sulfasalazine  to discontinue this so now off for maybe around 2 months.  X-ray last time also demonstrated definite ankylosis throughout the thoracic spine although some was due to multilevel degenerative disease.   He experiences increased morning stiffness and back pain, primarily on the left side, recurring over the past couple of weeks. The stiffness improves with movement, and he notes a twinge in his back, especially in the mornings.   He has been off sulfasalazine  for at least one to two months and associates the return of morning stiffness with discontinuation of the medication.   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 He does not perform stretches or exercises regularly, although he has previously been referred to physical therapy, which he found beneficial.   No visible swelling or significant pain in his feet and ankles, although he mentions occasional soreness, particularly after sitting for extended periods or consuming different foods. He describes a sensation of numbness in his skin, which worsens under these conditions. No significant pain when pressure is applied to his back but experiences a twinge when moving. He also reports feeling stiffness in his hamstrings when attempting to touch his toes, but no pain or twinge in his back during this movement.       Previous HPI 03/19/2023 Alexis Norris is a 63 y.o. 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  Right hand and wrist symptoms have been very well-controlled just gets occasional pain with use related for his work.  Has noticed worsening the past few months pain in the middle of his back.  This is most severe at night and first thing in the  morning.  Often awakens him from sleep.  Symptoms improve walking throughout the day he does notice a bit worse of forward bending after working.   Previous HPI 01/01/2023 Alexis Norris is a 63 y.o. 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  He started taking 1 tablet twice daily for a week after last visit then increased it to 2 tablets twice daily.  He noticed improvement in his right wrist pain and stiffness as well as back pain.  Still has a few minutes of stiffness waking up every morning.  He has noticed some new achiness affecting both hips feels this is most similar to the soreness after exercising but is not associated with any change in activity.  Also having some pain and stiffness at the right side of his neck with a little more trouble looking side-to-side and feels a nontender nodule in the area.   Previous HPI 11/26/2022 Alexis Norris is a 63 y.o. male here for follow up for inflammatory arthritis concern  for possible ankylosing spondylitis versus seronegative inflammatory arthritis with previous history of positive HLA-B27 and Graves' disease and elevated sedimentation rate.  Exam at our initial visit was consistent with active tenosynovitis of the right wrist.  X-ray negative for any local erosions or demineralization.  Lab test showed mild persistent elevated sed rate positive at 24 but this is much improved compared to previous levels.  Since then his wrist is doing slightly better but remains swollen.  He is noticing low back pain and stiffness for a few minutes each morning.  Also thinks he has some increase in the pain and stiffness usually notices getting out of his truck.  No change in the chronic lower leg swelling on both sides.  He was scheduled for updated colonoscopy in October due to scant bright red blood per rectum.   Previous HPI 10/24/22 Alexis Norris is a 63 y.o. male  here for evaluation of chronic joint pain in multiple areas associated elevated sedimentation rate and positive HLA-B27.  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  He has a chronic history of some pain and swelling affecting feet and ankles that was previously diagnosed as gout more than 5 years ago.  He was on treatment for this including allopurinol  and colchicine  but without significant impact on symptoms and if you never had any elevated uric acid blood testing monosodium urate crystals.  This treatment was discontinued a few months ago so far without any major flare.  He has had chronic low back pain for years pretty mild not significantly impacting activities or causing frequent nighttime awakening.  This has gotten a little bit worse over time but in the past few months experienced worsening evaluated sports medicine clinic treated with meloxicam  and as needed Flexeril .  He noticed a significant benefit in his joint inflammation when on the meloxicam .  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  Oral NSAIDs and wrist brace quickly improve this but since stopping the meloxicam  back to regularly as he has increased swelling again.  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  On review of previous chest x-ray imaging from 2018 also shows pretty extensive thoracic spine anterior osteophyte processes.  Not currently on any specific medication for joint inflammation has a few minutes of stiffness every morning.  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 Had concern reported for some exophthalmos related to Graves' disease was referred to ophthalmology not yet seen the specialist for  this.   Review of Systems  Constitutional:  Negative for fatigue.  HENT:  Negative for mouth sores and mouth dryness.   Eyes:  Positive for dryness.  Respiratory:  Negative for shortness of breath.   Cardiovascular:  Negative for chest pain and palpitations.  Gastrointestinal:  Positive for constipation. Negative for blood in stool and diarrhea.  Endocrine: Negative for increased urination.  Genitourinary:  Negative for involuntary urination.  Musculoskeletal:  Positive for joint pain, joint pain, joint swelling, myalgias, morning stiffness and myalgias. Negative for gait problem, muscle weakness and muscle tenderness.  Skin:  Positive for rash. Negative for color change, hair loss and sensitivity to sunlight.  Allergic/Immunologic: Negative for susceptible to infections.  Neurological:  Negative for dizziness and headaches.  Hematological:  Negative for swollen glands.  Psychiatric/Behavioral:  Negative for depressed mood and sleep disturbance. The patient is not nervous/anxious.     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   Dyslipidemia 01/13/2007   Essential hypertension 01/13/2007    Past Medical History:  Diagnosis Date   Arthritis    DYSLIPIDEMIA 01/13/2007   no medicines    HYPERTENSION 01/13/2007   HYPOTHYROIDISM, 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   Lumbar back pain    Wrist clicking     Family History  Problem Relation Age of Onset   Cancer Mother    Heart attack Father    Thyroid  cancer Sister    Colon cancer  Neg Hx    Liver disease Neg Hx    Esophageal cancer Neg Hx    Colon polyps Neg Hx    Rectal cancer Neg Hx    Stomach cancer Neg Hx    Past Surgical History:  Procedure Laterality Date   COLONOSCOPY     I-131 therapy  09/27/1997   maybe 2000-2001   INGUINAL HERNIA REPAIR Right 09/28/2013   Procedure: LAPAROSCOPIC RIGHT  INGUINAL HERNIA;  Surgeon: Lynda Leos, MD;  Location: [facility] OR;  Service: General;  Laterality: Right;   INSERTION OF MESH Right 09/28/2013   Procedure: INSERTION OF MESH;  Surgeon: Lynda Leos, MD;  Location: [facility] OR;  Service: General;  Laterality: Right;   right knuckle     pin placed in high school   Social History   Social History Narrative   Works Lobbyist History  Administered Date(s) Administered   Moderna Sars-Covid-2 Vaccination 07/09/2019, 08/09/2019, 04/28/2020   Td 06/28/2002   Tdap 04/10/2020     Objective: Vital Signs: BP 133/73 (BP Location: Left Arm, Patient Position: Sitting, Cuff Size: Normal)   Pulse 79   Resp 16   Ht 6' 1 (1.854 m)   Wt 292 lb 9.6 oz (132.7 kg)   BMI 38.60 kg/m    Physical Exam Eyes:     Conjunctiva/sclera: Conjunctivae normal.  Cardiovascular:     Rate and Rhythm: Normal rate and regular rhythm.  Pulmonary:     Effort: Pulmonary effort is normal.     Breath sounds: Normal breath sounds.  Lymphadenopathy:     Cervical: No cervical adenopathy.  Skin:    General: Skin is warm and dry.     Findings: Rash present.     Comments: Chronic skin thickening and hyperpigmentation in both lower extremities    Neurological:     Mental Status: He is alert.  Psychiatric:        Mood and Affect: Mood normal.      Musculoskeletal Exam:  Shoulders full ROM no tenderness or swelling Elbows full ROM no tenderness or swelling Wrists full ROM no tenderness or swelling Fingers full ROM no tenderness or swelling No localized tenderness to pressure midline or paraspinal muscles, significantly  decreased range of motion in flexion, extension and lateral rotation throughout thoracolumbar spine region Hip internal rotation/FADIR limited with some pain in lateral hip and going to low back Knees full ROM no tenderness or  swelling    Investigation: No additional findings.  Imaging: No results found.  Recent Labs: Lab Results  Component Value Date   WBC 5.9 12/03/2023   HGB 13.8 12/03/2023   PLT 219 12/03/2023   NA 139 12/03/2023   K 3.8 12/03/2023   CL 106 12/03/2023   CO2 25 12/03/2023   GLUCOSE 85 12/03/2023   BUN 15 12/03/2023   CREATININE 1.15 12/03/2023   BILITOT 0.5 12/03/2023   ALKPHOS 80 05/29/2023   AST 11 12/03/2023   ALT 7 (L) 12/03/2023   PROT 7.0 12/03/2023   ALBUMIN 4.4 05/29/2023   CALCIUM 9.0 12/03/2023   GFRAA >90 09/24/2013   QFTBGOLDPLUS NEGATIVE 03/19/2023    Speciality Comments: No specialty comments available.  Procedures:  No procedures performed Allergies: Simvastatin   Assessment / Plan:     Visit Diagnoses: Ankylosing spondylitis of thoracolumbar region [facility]) - Plan: Sedimentation rate Improved range of motion with physical therapy. No significant side effects from treatment. Discussed long-term management and remission criteria. Emphasized maintaining range of motion through exercises. Planned to monitor for remission over two years before considering medication reduction. -Checking sed rate for disease activity monitoring - Hyrimoz  40 mg subcu q. 14 days - Encourage continuation of range of motion exercises. - Monitor for signs of remission over a two-year period before considering medication reduction.  High risk medication use - Hyrimoz  40 mg into the skin every fourteen days - Plan: CBC with Differential/Platelet, Comprehensive metabolic panel with GFR Tolerating medication well without particular side effect or injection reaction.  No serious interval infection. - Checking CBC and CMP for medication monitoring on continued  Hyrimoz   Obesity on GLP-1 agonist therapy GLP-1 agonist therapy ongoing for 5-6 weeks with reduced appetite and some weight loss. Discussed importance of protein intake to prevent muscle loss. Emphasized making caloric intake count due to reduced total intake.  Intermittent diarrhea Possibly related to dietary intake and GLP-1 agonist therapy. Symptoms mild and not significantly impacting quality of life.  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 Symptoms mild and not significantly bothersome. Discussed high cost and novelty of medication. - Follow up with eye specialist in a few months. - Consider treatment options if symptoms worsen.   Orders: Orders Placed This Encounter  Procedures   Sedimentation rate   CBC with Differential/Platelet   Comprehensive metabolic panel with GFR   No orders of the defined types were placed in this encounter.    Follow-Up Instructions: Return in about 3 months (around 03/04/2024) for AS on ADA f/u 3mos.   Lonni LELON Ester, MD  Note - This record has been created using [employer].  Chart creation errors have been sought, but may not always  have been located. Such creation errors do not reflect on  the standard of medical care.

## 2023-12-02 ENCOUNTER — Other Ambulatory Visit: Payer: Self-pay | Admitting: Family Medicine

## 2023-12-03 ENCOUNTER — Encounter: Payer: Self-pay | Admitting: Internal Medicine

## 2023-12-03 ENCOUNTER — Ambulatory Visit: Attending: Internal Medicine | Admitting: Internal Medicine

## 2023-12-03 VITALS — BP 133/73 | HR 79 | Resp 16 | Ht 73.0 in | Wt 292.6 lb

## 2023-12-03 DIAGNOSIS — M455 Ankylosing spondylitis of thoracolumbar region: Secondary | ICD-10-CM

## 2023-12-03 DIAGNOSIS — Z79899 Other long term (current) drug therapy: Secondary | ICD-10-CM | POA: Diagnosis not present

## 2023-12-03 DIAGNOSIS — E559 Vitamin D deficiency, unspecified: Secondary | ICD-10-CM

## 2023-12-04 LAB — COMPREHENSIVE METABOLIC PANEL WITH GFR
AG Ratio: 1.7 (calc) (ref 1.0–2.5)
ALT: 7 U/L — ABNORMAL LOW (ref 9–46)
AST: 11 U/L (ref 10–35)
Albumin: 4.4 g/dL (ref 3.6–5.1)
Alkaline phosphatase (APISO): 70 U/L (ref 35–144)
BUN: 15 mg/dL (ref 7–25)
CO2: 25 mmol/L (ref 20–32)
Calcium: 9 mg/dL (ref 8.6–10.3)
Chloride: 106 mmol/L (ref 98–110)
Creat: 1.15 mg/dL (ref 0.70–1.35)
Globulin: 2.6 g/dL (ref 1.9–3.7)
Glucose, Bld: 85 mg/dL (ref 65–99)
Potassium: 3.8 mmol/L (ref 3.5–5.3)
Sodium: 139 mmol/L (ref 135–146)
Total Bilirubin: 0.5 mg/dL (ref 0.2–1.2)
Total Protein: 7 g/dL (ref 6.1–8.1)
eGFR: 73 mL/min/1.73m2 (ref >=60)

## 2023-12-04 LAB — CBC WITH DIFFERENTIAL/PLATELET
Absolute Lymphocytes: 1693 {cells}/uL (ref 850–3900)
Absolute Monocytes: 401 {cells}/uL (ref 200–950)
Basophils Absolute: 41 {cells}/uL (ref 0–200)
Basophils Relative: 0.7 %
Eosinophils Absolute: 271 {cells}/uL (ref 15–500)
Eosinophils Relative: 4.6 %
HCT: 41.2 % (ref 38.5–50.0)
Hemoglobin: 13.8 g/dL (ref 13.2–17.1)
MCH: 28.1 pg (ref 27.0–33.0)
MCHC: 33.5 g/dL (ref 32.0–36.0)
MCV: 83.9 fL (ref 80.0–100.0)
MPV: 9.4 fL (ref 7.5–12.5)
Monocytes Relative: 6.8 %
Neutro Abs: 3493 {cells}/uL (ref 1500–7800)
Neutrophils Relative %: 59.2 %
Platelets: 219 Thousand/uL (ref 140–400)
RBC: 4.91 Million/uL (ref 4.20–5.80)
RDW: 13.7 % (ref 11.0–15.0)
Total Lymphocyte: 28.7 %
WBC: 5.9 Thousand/uL (ref 3.8–10.8)

## 2023-12-04 LAB — SEDIMENTATION RATE: Sed Rate: 25 mm/h — ABNORMAL HIGH (ref 0–20)

## 2024-02-16 ENCOUNTER — Other Ambulatory Visit: Payer: Self-pay | Admitting: Internal Medicine

## 2024-02-16 DIAGNOSIS — Z79899 Other long term (current) drug therapy: Secondary | ICD-10-CM

## 2024-02-16 DIAGNOSIS — M455 Ankylosing spondylitis of thoracolumbar region: Secondary | ICD-10-CM

## 2024-02-16 NOTE — Telephone Encounter (Signed)
 Last Fill: 11/25/2023  Labs: 12/03/2023 ALT 7 Rest of CBC and CMP WNL  TB Gold: 03/19/2023 TB Gold Negative   Next Visit: 03/10/2024  Last Visit: 12/03/2023  DX: Ankylosing spondylitis of thoracolumbar region   Current Dose per office note 12/03/2023: Hyrimoz  40 mg into the skin every fourteen days   Okay to refill Hyrimoz ?   Patient due to update labs at upcoming appointment

## 2024-02-25 NOTE — Progress Notes (Signed)
 Office Visit Note  Patient: Alexis Norris             Date of Birth: 08/17/1960           MRN: [id number]             PCP: Kennyth Worth HERO, MD Referring: Kennyth Worth HERO, MD Visit Date: 03/10/2024   Subjective:   Discussed the use of AI scribe software for clinical note transcription with the patient, who gave verbal consent to proceed.  History of Present Illness   Alexis Norris is a 63 y.o. male here for follow up with ankylosing spondylitis who presents for follow-up on his treatment with Hyrimoz  injections 40 mg [place] q14days.     He notes improvement in back stiffness, although some stiffness persists after work. His wrists are asymptomatic. No recent viral illnesses or need for antibiotics since the last visit.  He mentions a recent stomach issue but does not believe it is related to his current treatment. His skin condition remains unchanged, with no new issues reported. He has noticed that his legs appear thinner, which he attributes to weight loss and less frequent squeezing exercises. His weight is 9 lbs less today than at our last appointment.  He is scheduled to see an eye doctor soon due to concerns about thyroid  eye disease.  He attended a symposium at [facility] where other doctors reviewed his case regarding cutaneous mucinosis, but he has not yet received any new insights or recommendations from that meeting.       Previous HPI 12/03/2023 Alexis Norris is a 63 y.o. male here for follow up with ankylosing spondylitis who presents for follow-up on his treatment with Hyrimoz  injections 40 mg [place] q14days.     He has been on Zepbound for approximately five to six weeks. The medication is working, but not as dramatically as he had anticipated. He notes a weight loss of about ten pounds, attributing it partially to the medication and partially to removing his shoes during weigh-ins. He is adjusting to the medication's effect on his appetite, stating that it makes him  less hungry, which is challenging as he is used to eating on a schedule. He is trying to ensure he maintains adequate protein intake to prevent muscle loss.   He has noticed little bumps on his legs occasionally. There have been no significant changes in his skin condition, although he mentions a slight increase in skin issues compared to six months ago.   He experiences occasional stomach upset and diarrhea, which he associates with his diet and the medication's effects. He also mentions difficulty sleeping through the night, often waking up and eating late at night. He has not been tested for sleep apnea but finds it hard to sleep seven to eight hours straight.   He has noticed some dryness around his eyes and has been seen by a specialist for this. He was advised about potential thyroid  eye disease, but at the time, he did not have significant symptoms to warrant treatment. He plans to follow up with the specialist in a few months.   He has been on Hyrimoz  injections for an unspecified duration and is unsure if this is a long-term treatment. He has not noticed significant side effects from the medication, aside from possible skin issues. He completed physical therapy two weeks ago, which focused on strengthening his core and improving hip flexibility. He is trying to maintain the exercises independently.       Previous  HPI 10/01/2023 Alexis Norris is a 63 year old male with ankylosing spondylitis who presents for follow-up on his treatment with Hyrimoz  injections 40 mg [place] q14days.   He started Hyrimoz  injections in April and has received three injections so far, with the fourth one due next weekend. He reports feeling a little better, with less morning stiffness and improved ability to navigate stairs, although he still experiences occasional back soreness and stiffness. An MRI showed less bridging syndesmophytes than initially suggested by x-ray. He has two levels in the lumbar spine fused by  bone spurring, but most of the spine is not fused.   He experiences some reflux and stomach upset, which he associates with taking vitamin D  supplements. He takes a daily dose of 5000 IU in soft gel form. He also notes a change in bowel regularity, with occasional scant blood in the tissue, which he attributes to dietary factors and occasional alcohol consumption.   He has noticed small bumps on his skin that itch slightly and bleed when scratched, but they scab over and resolve. He plans to discuss these skin changes with his dermatologist in August.   He reports some swelling in his left ankle, which has been larger than the right for some time, requiring a change in shoe size. He has a history of physical therapy for back issues, which previously exacerbated his symptoms.         Previous HPI 06/25/2023 Alexis Norris is a 63 y.o. male here for follow up for ankylosing spondylitis.  Lab testing at previous visit did show mild elevation in sedimentation rate is unclear how much clinical benefit he is getting from sulfasalazine  to discontinue this so now off for maybe around 2 months.  X-ray last time also demonstrated definite ankylosis throughout the thoracic spine although some was due to multilevel degenerative disease.   He experiences increased morning stiffness and back pain, primarily on the left side, recurring over the past couple of weeks. The stiffness improves with movement, and he notes a twinge in his back, especially in the mornings.   He has been off sulfasalazine  for at least one to two months and associates the return of morning stiffness with discontinuation of the medication.   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 He does not perform stretches or exercises regularly, although he has previously been referred to physical therapy, which he found beneficial.    No visible swelling or significant pain in his feet and ankles, although he mentions occasional soreness, particularly after sitting for extended periods or consuming different foods. He describes a sensation of numbness in his skin, which worsens under these conditions. No significant pain when pressure is applied to his back but experiences a twinge when moving. He also reports feeling stiffness in his hamstrings when attempting to touch his toes, but no pain or twinge in his back during this movement.       Previous HPI 03/19/2023 Berwyn Bigley is a 63 y.o. 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  Right hand and wrist symptoms have been very well-controlled just gets occasional pain with use related for his work.  Has noticed worsening the past few months pain in the middle of his back.  This is most severe at night and first thing in the morning.  Often awakens him from sleep.  Symptoms improve walking throughout the day he does notice a bit worse of forward bending after working.   Previous HPI 01/01/2023 Maks Cavallero is a 63 y.o. 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  He started taking 1 tablet twice daily for a week after last visit then increased it to 2 tablets twice daily.  He noticed improvement in his right wrist pain and stiffness as well as back pain.  Still has a few minutes of stiffness waking up every morning.  He has noticed some new achiness affecting both hips feels this is most similar to the soreness after exercising but is not associated with any change in activity.  Also having some pain and stiffness at the right side of his neck with a little more trouble looking side-to-side and feels a nontender nodule in the area.   Previous HPI 11/26/2022 Hobie Kohles is a 63 y.o.  male here for follow up for inflammatory arthritis concern for possible ankylosing spondylitis versus seronegative inflammatory arthritis with previous history of positive HLA-B27 and Graves' disease and elevated sedimentation rate.  Exam at our initial visit was consistent with active tenosynovitis of the right wrist.  X-ray negative for any local erosions or demineralization.  Lab test showed mild persistent elevated sed rate positive at 24 but this is much improved compared to previous levels.  Since then his wrist is doing slightly better but remains swollen.  He is noticing low back pain and stiffness for a few minutes each morning.  Also thinks he has some increase in the pain and stiffness usually notices getting out of his truck.  No change in the chronic lower leg swelling on both sides.  He was scheduled for updated colonoscopy in October due to scant bright red blood per rectum.   Previous HPI 10/24/22 Steed Kanaan is a 63 y.o. male here for evaluation of chronic joint pain in multiple areas associated elevated sedimentation rate and positive HLA-B27.  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  He has a chronic history of some pain and swelling affecting feet and ankles that was previously diagnosed as gout more than 5 years ago.  He was on treatment for this including allopurinol  and colchicine  but without significant impact on symptoms and if you never had any elevated uric acid blood testing monosodium urate crystals.  This treatment was discontinued a few months ago so far without any major flare.  He has had chronic low back pain for years pretty mild not significantly impacting activities or causing frequent nighttime awakening.  This has gotten a little bit worse over time but in the past few months experienced worsening evaluated sports medicine clinic treated with  meloxicam  and as needed Flexeril .  He noticed a significant benefit in his joint inflammation when on the meloxicam .  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  Oral NSAIDs and wrist brace quickly improve this but since stopping the meloxicam  back to regularly as he has increased swelling again.  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  On review of previous chest x-ray imaging from 2018 also shows pretty extensive thoracic spine anterior osteophyte processes.  Not currently on any specific medication for joint inflammation has a few minutes of stiffness every morning.  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 Had concern reported for some exophthalmos related to Graves' disease was referred to ophthalmology not yet seen the specialist for this.     Review of Systems  Constitutional:  Negative for fatigue.  HENT:  Negative for mouth sores and mouth dryness.   Eyes:  Negative for dryness.  Respiratory:  Negative for shortness of breath.   Cardiovascular:  Negative for chest pain and palpitations.  Gastrointestinal:  Negative for blood in stool, constipation and diarrhea.  Endocrine: Negative for increased urination.  Genitourinary:  Negative for involuntary urination.  Musculoskeletal:  Positive for morning stiffness. Negative for joint pain, gait problem, joint pain, joint swelling, myalgias, muscle weakness, muscle tenderness and myalgias.  Skin:  Negative for color change, rash, hair loss and sensitivity to sunlight.  Allergic/Immunologic: Negative for susceptible to infections.  Neurological:  Negative for dizziness and headaches.  Hematological:  Negative for swollen glands.  Psychiatric/Behavioral:  Negative for depressed mood and sleep disturbance. The patient is not nervous/anxious.      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   Dyslipidemia 01/13/2007   Essential hypertension 01/13/2007    Past Medical History:  Diagnosis Date   Arthritis    DYSLIPIDEMIA 01/13/2007   no medicines    HYPERTENSION 01/13/2007   HYPOTHYROIDISM, 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   Lumbar back pain    Wrist clicking     Family History  Problem Relation Age of Onset   Cancer Mother    Heart attack Father    Thyroid  cancer Sister    Colon cancer Neg Hx    Liver disease Neg Hx    Esophageal cancer Neg Hx    Colon polyps Neg Hx    Rectal cancer Neg Hx    Stomach cancer Neg Hx    Past Surgical History:  Procedure Laterality Date   COLONOSCOPY     I-131 therapy  09/27/1997   maybe 2000-2001   INGUINAL HERNIA REPAIR Right 09/28/2013   Procedure: LAPAROSCOPIC RIGHT  INGUINAL HERNIA;  Surgeon: Lynda Leos, MD;  Location: [facility] OR;  Service: General;  Laterality: Right;   INSERTION OF MESH Right 09/28/2013   Procedure: INSERTION OF MESH;  Surgeon: Lynda Leos, MD;  Location: [facility] OR;  Service: General;  Laterality: Right;   right knuckle     pin placed in high school   Social History   Social History Narrative   Works Lobbyist History  Administered Date(s) Administered   Moderna Sars-Covid-2 Vaccination 07/09/2019, 08/09/2019, 04/28/2020   Td 06/28/2002   Tdap 04/10/2020     Objective: Vital Signs: BP 130/70   Pulse 87   Temp 98.4 F (36.9 C)   Resp 16   Ht 6' 1 (1.854 m)   Wt 283 lb 12.8  oz (128.7 kg)   BMI 37.44 kg/m    Physical Exam Eyes:     Conjunctiva/sclera: Conjunctivae normal.  Cardiovascular:     Rate and Rhythm: Normal rate and regular rhythm.  Pulmonary:  Effort: Pulmonary effort is normal.     Breath sounds: Normal breath sounds.  Lymphadenopathy:     Cervical: No cervical adenopathy.  Skin:    General: Skin is warm and dry.     Findings: Rash present.     Comments: Chronic skin thickening and hyperpigmentation in both lower extremities  Neurological:     Mental Status: He is alert.  Psychiatric:        Mood and Affect: Mood normal.   Calves ~48 cm circumference b/l, right ankle ~33cm circumference, left ankle ~38 cm circumference  Musculoskeletal Exam:  Shoulders full ROM no tenderness or swelling Elbows full ROM no tenderness or swelling Wrists full ROM no tenderness or swelling Fingers full ROM no tenderness or swelling No localized tenderness to pressure midline or paraspinal muscles, significantly decreased range of motion in flexion, extension and lateral rotation throughout thoracolumbar spine region Knees full ROM no tenderness or swelling    Investigation: No additional findings.  Imaging: No results found.  Recent Labs: Lab Results  Component Value Date   WBC 5.9 12/03/2023   HGB 13.8 12/03/2023   PLT 219 12/03/2023   NA 139 12/03/2023   K 3.8 12/03/2023   CL 106 12/03/2023   CO2 25 12/03/2023   GLUCOSE 85 12/03/2023   BUN 15 12/03/2023   CREATININE 1.15 12/03/2023   BILITOT 0.5 12/03/2023   ALKPHOS 80 05/29/2023   AST 11 12/03/2023   ALT 7 (L) 12/03/2023   PROT 7.0 12/03/2023   ALBUMIN 4.4 05/29/2023   CALCIUM 9.0 12/03/2023   GFRAA >90 09/24/2013   QFTBGOLDPLUS NEGATIVE 03/19/2023    Speciality Comments: No specialty comments available.  Procedures:  No procedures performed Allergies: Simvastatin   Assessment / Plan:     Visit Diagnoses: Ankylosing spondylitis of thoracolumbar region [facility]) - Plan:  Sedimentation rate No significant side effects from treatment. Discussed long-term management and remission criteria. Emphasized maintaining range of motion through exercises. Appears completely controlled since this summer.  Doing well today. - Checking sed rate for disease activity monitoring - Hyrimoz  40 mg subcu q. 14 days - Encourage continuation of range of motion exercises.  High risk medication use - Hyrimoz  40 mg into the skin every fourteen days - Plan: CBC with Differential/Platelet, Comprehensive metabolic panel with GFR, QuantiFERON-TB Gold Plus Tolerating medication well without particular side effect or injection reaction.  No serious interval infection. 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 Symptoms mild and not significantly bothersome. Discussed high cost and novelty of medication. Has upcoming specialist f/u discussion of agent for disease specific treatment option.  Orders: Orders Placed This Encounter  Procedures   Sedimentation rate   CBC with Differential/Platelet   Comprehensive metabolic panel with GFR   QuantiFERON-TB Gold Plus   No orders of the defined types were placed in this encounter.    Follow-Up Instructions: Return in about 3 months (around 06/10/2024) for AS on ADA f/u 3mos.   Lonni LELON Ester, MD  Note - This record has been created using [employer].  Chart creation errors have been sought, but may not always  have been located. Such creation errors do not reflect on  the standard of medical care.

## 2024-03-04 ENCOUNTER — Other Ambulatory Visit: Payer: Self-pay | Admitting: Family Medicine

## 2024-03-10 ENCOUNTER — Ambulatory Visit: Attending: Internal Medicine | Admitting: Internal Medicine

## 2024-03-10 ENCOUNTER — Encounter: Payer: Self-pay | Admitting: Internal Medicine

## 2024-03-10 VITALS — BP 130/70 | HR 87 | Temp 98.4°F | Resp 16 | Ht 73.0 in | Wt 283.8 lb

## 2024-03-10 DIAGNOSIS — Z79899 Other long term (current) drug therapy: Secondary | ICD-10-CM | POA: Diagnosis not present

## 2024-03-10 DIAGNOSIS — M455 Ankylosing spondylitis of thoracolumbar region: Secondary | ICD-10-CM

## 2024-03-12 ENCOUNTER — Ambulatory Visit: Payer: Self-pay | Admitting: Internal Medicine

## 2024-03-12 LAB — CBC WITH DIFFERENTIAL/PLATELET
Absolute Lymphocytes: 1618 {cells}/uL (ref 850–3900)
Absolute Monocytes: 409 {cells}/uL (ref 200–950)
Basophils Absolute: 37 {cells}/uL (ref 0–200)
Basophils Relative: 0.6 %
Eosinophils Absolute: 223 {cells}/uL (ref 15–500)
Eosinophils Relative: 3.6 %
HCT: 42.4 % (ref 38.5–50.0)
Hemoglobin: 14.3 g/dL (ref 13.2–17.1)
MCH: 27.9 pg (ref 27.0–33.0)
MCHC: 33.7 g/dL (ref 32.0–36.0)
MCV: 82.7 fL (ref 80.0–100.0)
MPV: 9.4 fL (ref 7.5–12.5)
Monocytes Relative: 6.6 %
Neutro Abs: 3912 {cells}/uL (ref 1500–7800)
Neutrophils Relative %: 63.1 %
Platelets: 225 Thousand/uL (ref 140–400)
RBC: 5.13 Million/uL (ref 4.20–5.80)
RDW: 13.4 % (ref 11.0–15.0)
Total Lymphocyte: 26.1 %
WBC: 6.2 Thousand/uL (ref 3.8–10.8)

## 2024-03-12 LAB — COMPREHENSIVE METABOLIC PANEL WITH GFR
AG Ratio: 1.9 (calc) (ref 1.0–2.5)
ALT: 7 U/L — ABNORMAL LOW (ref 9–46)
AST: 11 U/L (ref 10–35)
Albumin: 4.5 g/dL (ref 3.6–5.1)
Alkaline phosphatase (APISO): 61 U/L (ref 35–144)
BUN: 11 mg/dL (ref 7–25)
CO2: 23 mmol/L (ref 20–32)
Calcium: 8.9 mg/dL (ref 8.6–10.3)
Chloride: 108 mmol/L (ref 98–110)
Creat: 0.89 mg/dL (ref 0.70–1.35)
Globulin: 2.4 g/dL (ref 1.9–3.7)
Glucose, Bld: 93 mg/dL (ref 65–99)
Potassium: 3.8 mmol/L (ref 3.5–5.3)
Sodium: 141 mmol/L (ref 135–146)
Total Bilirubin: 0.6 mg/dL (ref 0.2–1.2)
Total Protein: 6.9 g/dL (ref 6.1–8.1)
eGFR: 97 mL/min/1.73m2 (ref >=60)

## 2024-03-12 LAB — QUANTIFERON-TB GOLD PLUS
Mitogen-NIL: >10 [IU]/mL
NIL: 0.03 [IU]/mL
QuantiFERON-TB Gold Plus: NEGATIVE
TB1-NIL: 0 [IU]/mL
TB2-NIL: 0 [IU]/mL

## 2024-03-12 LAB — SEDIMENTATION RATE: Sed Rate: 29 mm/h — ABNORMAL HIGH (ref 0–20)

## 2024-05-12 ENCOUNTER — Other Ambulatory Visit: Payer: Self-pay | Admitting: Internal Medicine

## 2024-05-12 DIAGNOSIS — Z79899 Other long term (current) drug therapy: Secondary | ICD-10-CM

## 2024-05-12 DIAGNOSIS — M455 Ankylosing spondylitis of thoracolumbar region: Secondary | ICD-10-CM

## 2024-05-12 NOTE — Telephone Encounter (Signed)
 Last Fill: 02/16/2024  Labs: 03/10/2024   There were no significant abnormal results and no new medication changes needed. Sed rate remains slightly elevated at 29 but clinically looks good.   TB Gold: 03/10/2024 Negative   Next Visit: 06/09/2024  Last Visit: 03/10/2024  DX:Ankylosing spondylitis of thoracolumbar region   Current Dose per office note 03/10/2024: Hyrimoz  40 mg into the skin every fourteen days   Okay to refill Hyrimoz ?

## 2024-05-27 ENCOUNTER — Other Ambulatory Visit: Payer: Self-pay | Admitting: Family Medicine

## 2024-06-01 ENCOUNTER — Encounter: Payer: Self-pay | Admitting: Family Medicine

## 2024-06-01 ENCOUNTER — Ambulatory Visit: Payer: 59 | Admitting: Family Medicine

## 2024-06-01 VITALS — BP 110/70 | HR 86 | Temp 97.7°F | Ht 73.0 in | Wt 281.0 lb

## 2024-06-01 DIAGNOSIS — Z125 Encounter for screening for malignant neoplasm of prostate: Secondary | ICD-10-CM

## 2024-06-01 DIAGNOSIS — M109 Gout, unspecified: Secondary | ICD-10-CM

## 2024-06-01 DIAGNOSIS — E559 Vitamin D deficiency, unspecified: Secondary | ICD-10-CM

## 2024-06-01 DIAGNOSIS — Z131 Encounter for screening for diabetes mellitus: Secondary | ICD-10-CM | POA: Diagnosis not present

## 2024-06-01 DIAGNOSIS — E785 Hyperlipidemia, unspecified: Secondary | ICD-10-CM

## 2024-06-01 DIAGNOSIS — E890 Postprocedural hypothyroidism: Secondary | ICD-10-CM

## 2024-06-01 DIAGNOSIS — I10 Essential (primary) hypertension: Secondary | ICD-10-CM | POA: Diagnosis not present

## 2024-06-01 DIAGNOSIS — Z0000 Encounter for general adult medical examination without abnormal findings: Secondary | ICD-10-CM

## 2024-06-01 DIAGNOSIS — Z0001 Encounter for general adult medical examination with abnormal findings: Secondary | ICD-10-CM

## 2024-06-01 DIAGNOSIS — Z23 Encounter for immunization: Secondary | ICD-10-CM | POA: Diagnosis not present

## 2024-06-01 LAB — CBC
HCT: 41.6 % (ref 39.0–52.0)
Hemoglobin: 14 g/dL (ref 13.0–17.0)
MCHC: 33.7 g/dL (ref 30.0–36.0)
MCV: 81.6 fl (ref 78.0–100.0)
Platelets: 234 10*3/uL (ref 150.0–400.0)
RBC: 5.09 Mil/uL (ref 4.22–5.81)
RDW: 14.5 % (ref 11.5–15.5)
WBC: 7.2 10*3/uL (ref 4.0–10.5)

## 2024-06-01 LAB — COMPREHENSIVE METABOLIC PANEL WITH GFR
ALT: 8 U/L (ref 3–53)
AST: 11 U/L (ref 5–37)
Albumin: 4.6 g/dL (ref 3.5–5.2)
Alkaline Phosphatase: 67 U/L (ref 39–117)
BUN: 21 mg/dL (ref 6–23)
CO2: 26 meq/L (ref 19–32)
Calcium: 9.1 mg/dL (ref 8.4–10.5)
Chloride: 107 meq/L (ref 96–112)
Creatinine, Ser: 0.99 mg/dL (ref 0.40–1.50)
GFR: 82.35 mL/min
Glucose, Bld: 84 mg/dL (ref 70–99)
Potassium: 3.8 meq/L (ref 3.5–5.1)
Sodium: 141 meq/L (ref 135–145)
Total Bilirubin: 0.8 mg/dL (ref 0.2–1.2)
Total Protein: 7.2 g/dL (ref 6.0–8.3)

## 2024-06-01 LAB — LIPID PANEL
Cholesterol: 162 mg/dL (ref 28–200)
HDL: 44.7 mg/dL
LDL Cholesterol: 90 mg/dL (ref 10–99)
NonHDL: 117.25
Total CHOL/HDL Ratio: 4
Triglycerides: 134 mg/dL (ref 10.0–149.0)
VLDL: 26.8 mg/dL (ref 0.0–40.0)

## 2024-06-01 LAB — HEMOGLOBIN A1C: Hgb A1c MFr Bld: 4.8 % (ref 4.6–6.5)

## 2024-06-01 LAB — PSA: PSA: 0.62 ng/mL (ref 0.10–4.00)

## 2024-06-01 LAB — VITAMIN D 25 HYDROXY (VIT D DEFICIENCY, FRACTURES): VITD: 31.15 ng/mL (ref 30.00–100.00)

## 2024-06-01 LAB — TSH: TSH: 3 u[IU]/mL (ref 0.35–5.50)

## 2024-06-01 LAB — URIC ACID: Uric Acid, Serum: 6.9 mg/dL (ref 4.0–7.8)

## 2024-06-01 MED ORDER — TADALAFIL 10 MG PO TABS: 10.0000 mg | ORAL_TABLET | Freq: Every day | ORAL | 0 refills | Status: AC

## 2024-06-01 NOTE — Assessment & Plan Note (Signed)
Check vitamin D with labs.

## 2024-06-01 NOTE — Assessment & Plan Note (Signed)
 No recent flares.  Check uric acid level.  Takes colchicine  as needed.

## 2024-06-01 NOTE — Assessment & Plan Note (Signed)
Blood pressure at goal today on losartan 100 mg daily. 

## 2024-06-01 NOTE — Assessment & Plan Note (Signed)
 Check lipids. Discussed lifestyle modifications.

## 2024-06-02 NOTE — Progress Notes (Unsigned)
 "  Office Visit Note  Patient: Alexis Norris             Date of Birth: [birth year]           MRN: [id number]             PCP: Kennyth Worth HERO, MD Referring: Kennyth Worth HERO, MD Visit Date: 06/09/2024   Subjective:  No chief complaint on file.   History of Present Illness: Alexis Norris is a 64 y.o. male here for follow up  with ankylosing spondylitis who presents for follow-up on his treatment with Hyrimoz  injections 40 mg [place] q14days.      Previous HPI 03/10/2024 Alexis Norris is a 64 y.o. male here for follow up with ankylosing spondylitis who presents for follow-up on his treatment with Hyrimoz  injections 40 mg [place] q14days.      He notes improvement in back stiffness, although some stiffness persists after work. His wrists are asymptomatic. No recent viral illnesses or need for antibiotics since the last visit.   He mentions a recent stomach issue but does not believe it is related to his current treatment. His skin condition remains unchanged, with no new issues reported. He has noticed that his legs appear thinner, which he attributes to weight loss and less frequent squeezing exercises. His weight is 9 lbs less today than at our last appointment.   He is scheduled to see an eye doctor soon due to concerns about thyroid  eye disease.   He attended a symposium at [facility] where other doctors reviewed his case regarding cutaneous mucinosis, but he has not yet received any new insights or recommendations from that meeting.         Previous HPI 12/03/2023 Alexis Norris is a 64 y.o. male here for follow up with ankylosing spondylitis who presents for follow-up on his treatment with Hyrimoz  injections 40 mg [place] q14days.     He has been on Zepbound for approximately five to six weeks. The medication is working, but not as dramatically as he had anticipated. He notes a weight loss of about ten pounds, attributing it partially to the medication and partially to removing his  shoes during weigh-ins. He is adjusting to the medication's effect on his appetite, stating that it makes him less hungry, which is challenging as he is used to eating on a schedule. He is trying to ensure he maintains adequate protein intake to prevent muscle loss.   He has noticed little bumps on his legs occasionally. There have been no significant changes in his skin condition, although he mentions a slight increase in skin issues compared to six months ago.   He experiences occasional stomach upset and diarrhea, which he associates with his diet and the medication's effects. He also mentions difficulty sleeping through the night, often waking up and eating late at night. He has not been tested for sleep apnea but finds it hard to sleep seven to eight hours straight.   He has noticed some dryness around his eyes and has been seen by a specialist for this. He was advised about potential thyroid  eye disease, but at the time, he did not have significant symptoms to warrant treatment. He plans to follow up with the specialist in a few months.   He has been on Hyrimoz  injections for an unspecified duration and is unsure if this is a long-term treatment. He has not noticed significant side effects from the medication, aside from possible skin issues. He completed physical therapy two  weeks ago, which focused on strengthening his core and improving hip flexibility. He is trying to maintain the exercises independently.       Previous HPI 10/01/2023 Alexis Norris is a 64 year old male with ankylosing spondylitis who presents for follow-up on his treatment with Hyrimoz  injections 40 mg [place] q14days.   He started Hyrimoz  injections in April and has received three injections so far, with the fourth one due next weekend. He reports feeling a little better, with less morning stiffness and improved ability to navigate stairs, although he still experiences occasional back soreness and stiffness. An MRI showed  less bridging syndesmophytes than initially suggested by x-ray. He has two levels in the lumbar spine fused by bone spurring, but most of the spine is not fused.   He experiences some reflux and stomach upset, which he associates with taking vitamin D  supplements. He takes a daily dose of 5000 IU in soft gel form. He also notes a change in bowel regularity, with occasional scant blood in the tissue, which he attributes to dietary factors and occasional alcohol consumption.   He has noticed small bumps on his skin that itch slightly and bleed when scratched, but they scab over and resolve. He plans to discuss these skin changes with his dermatologist in August.   He reports some swelling in his left ankle, which has been larger than the right for some time, requiring a change in shoe size. He has a history of physical therapy for back issues, which previously exacerbated his symptoms.         Previous HPI 06/25/2023 Alexis Norris is a 64 y.o. male here for follow up for ankylosing spondylitis.  Lab testing at previous visit did show mild elevation in sedimentation rate is unclear how much clinical benefit he is getting from sulfasalazine  to discontinue this so now off for maybe around 2 months.  X-ray last time also demonstrated definite ankylosis throughout the thoracic spine although some was due to multilevel degenerative disease.   He experiences increased morning stiffness and back pain, primarily on the left side, recurring over the past couple of weeks. The stiffness improves with movement, and he notes a twinge in his back, especially in the mornings.   He has been off sulfasalazine  for at least one to two months and associates the return of morning stiffness with discontinuation of the medication.   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 He does not perform stretches or  exercises regularly, although he has previously been referred to physical therapy, which he found beneficial.   No visible swelling or significant pain in his feet and ankles, although he mentions occasional soreness, particularly after sitting for extended periods or consuming different foods. He describes a sensation of numbness in his skin, which worsens under these conditions. No significant pain when pressure is applied to his back but experiences a twinge when moving. He also reports feeling stiffness in his hamstrings when attempting to touch his toes, but no pain or twinge in his back during this movement.       Previous HPI 03/19/2023 Alexis Norris is a 64 y.o. 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  Right hand and wrist symptoms have been very well-controlled just gets occasional pain with use related for his work.  Has noticed worsening the past few  months pain in the middle of his back.  This is most severe at night and first thing in the morning.  Often awakens him from sleep.  Symptoms improve walking throughout the day he does notice a bit worse of forward bending after working.   Previous HPI 01/01/2023 Alexis Norris is a 64 y.o. 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  He started taking 1 tablet twice daily for a week after last visit then increased it to 2 tablets twice daily.  He noticed improvement in his right wrist pain and stiffness as well as back pain.  Still has a few minutes of stiffness waking up every morning.  He has noticed some new achiness affecting both hips feels this is most similar to the soreness after exercising but is not associated with any change in activity.  Also having some pain and stiffness at the right side of his neck with a little more trouble looking  side-to-side and feels a nontender nodule in the area.   Previous HPI 11/26/2022 Alexis Norris is a 64 y.o. male here for follow up for inflammatory arthritis concern for possible ankylosing spondylitis versus seronegative inflammatory arthritis with previous history of positive HLA-B27 and Graves' disease and elevated sedimentation rate.  Exam at our initial visit was consistent with active tenosynovitis of the right wrist.  X-ray negative for any local erosions or demineralization.  Lab test showed mild persistent elevated sed rate positive at 24 but this is much improved compared to previous levels.  Since then his wrist is doing slightly better but remains swollen.  He is noticing low back pain and stiffness for a few minutes each morning.  Also thinks he has some increase in the pain and stiffness usually notices getting out of his truck.  No change in the chronic lower leg swelling on both sides.  He was scheduled for updated colonoscopy in October due to scant bright red blood per rectum.   Previous HPI 10/24/22 Alexis Norris is a 64 y.o. male here for evaluation of chronic joint pain in multiple areas associated elevated sedimentation rate and positive HLA-B27.  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  He has a chronic history of some pain and swelling affecting feet and ankles that was previously diagnosed as gout more than 5 years ago.  He was on treatment for this including allopurinol  and colchicine  but without significant impact on symptoms and if you never had any elevated uric acid blood testing monosodium urate crystals.  This treatment was discontinued a few months ago so far without any major flare.  He has had chronic low back pain for years pretty mild not significantly impacting activities or causing frequent nighttime awakening.  This has gotten a little  bit worse over time but in the past few months experienced worsening evaluated sports medicine clinic treated with meloxicam  and as needed Flexeril .  He noticed a significant benefit in his joint inflammation when on the meloxicam .  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  Oral NSAIDs and wrist brace quickly improve this but since stopping the meloxicam  back to regularly as he has increased swelling again.  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  On review of previous chest x-ray imaging from  2018 also shows pretty extensive thoracic spine anterior osteophyte processes.  Not currently on any specific medication for joint inflammation has a few minutes of stiffness every morning.  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 Had concern reported for some exophthalmos related to Graves' disease was referred to ophthalmology not yet seen the specialist for this.     No Rheumatology ROS completed.   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   Dyslipidemia 01/13/2007   Essential hypertension 01/13/2007    Past Medical History:  Diagnosis Date   Arthritis    DYSLIPIDEMIA 01/13/2007   no medicines    HYPERTENSION 01/13/2007   HYPOTHYROIDISM, 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   Lumbar back pain    Wrist clicking     Family History  Problem Relation  Age of Onset   Cancer Mother    Heart attack Father    Thyroid  cancer Sister    Colon cancer Neg Hx    Liver disease Neg Hx    Esophageal cancer Neg Hx    Colon polyps Neg Hx    Rectal cancer Neg Hx    Stomach cancer Neg Hx    Past Surgical History:  Procedure Laterality Date   COLONOSCOPY     I-131 therapy  09/27/1997   maybe 2000-2001   INGUINAL HERNIA REPAIR Right 09/28/2013   Procedure: LAPAROSCOPIC RIGHT  INGUINAL HERNIA;  Surgeon: Lynda Leos, MD;  Location: [facility] OR;  Service: General;  Laterality: Right;   INSERTION OF MESH Right 09/28/2013   Procedure: INSERTION OF MESH;  Surgeon: Lynda Leos, MD;  Location: [facility] OR;  Service: General;  Laterality: Right;   right knuckle     pin placed in high school   Social History   Social History Narrative   Works Lobbyist History  Administered Date(s) Administered   Moderna Sars-Covid-2 Vaccination 07/09/2019, 08/09/2019, 04/28/2020   PNEUMOCOCCAL CONJUGATE-20 06/01/2024   Td 06/28/2002   Tdap 04/10/2020   Zoster Recombinant(Shingrix) 06/01/2024     Objective: Vital Signs: There were no vitals taken for this visit.   Physical Exam   Musculoskeletal Exam: ***   Investigation: No additional findings.  Imaging: No results found.  Recent Labs: Lab Results  Component Value Date   WBC 6.2 03/10/2024   HGB 14.3 03/10/2024   PLT 225 03/10/2024   NA 141 03/10/2024   K 3.8 03/10/2024   CL 108 03/10/2024   CO2 23 03/10/2024   GLUCOSE 93 03/10/2024   BUN 11 03/10/2024   CREATININE 0.89 03/10/2024   BILITOT 0.6 03/10/2024   ALKPHOS 80 05/29/2023   AST 11 03/10/2024   ALT 7 (L) 03/10/2024   PROT 6.9 03/10/2024   ALBUMIN 4.4 05/29/2023   CALCIUM 8.9 03/10/2024   GFRAA >90 09/24/2013   QFTBGOLDPLUS NEGATIVE 03/10/2024    Speciality Comments: No specialty comments available.  Procedures:  No procedures performed Allergies: Simvastatin   Assessment / Plan:     Visit Diagnoses:   Assessment & Plan Ankylosing spondylitis of thoracolumbar  region [facility])     High risk medication use      ***  Follow-Up Instructions: No follow-ups on file.   Winda Summerall M Charnell Peplinski, CMA  Note - This record has been created using Animal nutrition[employer].  Chart creation errors have been sought, but may not always  have been located. Such creation errors do not reflect on  the standard of medical care. "

## 2024-06-02 NOTE — Assessment & Plan Note (Signed)
 Alexis Norris

## 2024-06-03 ENCOUNTER — Ambulatory Visit: Payer: Self-pay | Admitting: Family Medicine

## 2024-06-03 NOTE — Progress Notes (Signed)
 Great news! Labs are all stable.  Do not need to make any changes to his treatment plan at this time.  We can recheck everything in a year or so.

## 2024-06-09 ENCOUNTER — Ambulatory Visit: Admitting: Internal Medicine

## 2024-06-09 DIAGNOSIS — M455 Ankylosing spondylitis of thoracolumbar region: Secondary | ICD-10-CM

## 2024-06-09 DIAGNOSIS — Z79899 Other long term (current) drug therapy: Secondary | ICD-10-CM

## 2024-08-17 ENCOUNTER — Ambulatory Visit

## 2025-06-06 ENCOUNTER — Encounter: Admitting: Family Medicine
